# Patient Record
Sex: Female | Born: 1975
Health system: Southern US, Community
[De-identification: ages and names within clinical notes are randomized; demographics above are authoritative.]

## PROBLEM LIST (undated history)

## (undated) DIAGNOSIS — E119 Type 2 diabetes mellitus without complications: Secondary | ICD-10-CM

## (undated) DIAGNOSIS — Z91013 Allergy to seafood: Secondary | ICD-10-CM

## (undated) DIAGNOSIS — J302 Other seasonal allergic rhinitis: Secondary | ICD-10-CM

## (undated) DIAGNOSIS — I10 Essential (primary) hypertension: Secondary | ICD-10-CM

## (undated) DIAGNOSIS — T7840XA Allergy, unspecified, initial encounter: Secondary | ICD-10-CM

## (undated) DIAGNOSIS — F419 Anxiety disorder, unspecified: Secondary | ICD-10-CM

## (undated) DIAGNOSIS — J45909 Unspecified asthma, uncomplicated: Secondary | ICD-10-CM

## (undated) DIAGNOSIS — E785 Hyperlipidemia, unspecified: Secondary | ICD-10-CM

## (undated) HISTORY — DX: Hyperlipidemia, unspecified: E78.5

## (undated) HISTORY — DX: Allergy, unspecified, initial encounter: T78.40XA

---

## 1998-12-10 ENCOUNTER — Encounter: Payer: Self-pay | Admitting: Emergency Medicine

## 1998-12-10 ENCOUNTER — Emergency Department (HOSPITAL_COMMUNITY): Admission: EM | Admit: 1998-12-10 | Discharge: 1998-12-10 | Payer: Self-pay | Admitting: Emergency Medicine

## 1999-06-16 ENCOUNTER — Emergency Department (HOSPITAL_COMMUNITY): Admission: EM | Admit: 1999-06-16 | Discharge: 1999-06-16 | Payer: Self-pay | Admitting: Emergency Medicine

## 1999-07-05 ENCOUNTER — Ambulatory Visit (HOSPITAL_COMMUNITY): Admission: RE | Admit: 1999-07-05 | Discharge: 1999-07-05 | Payer: Self-pay | Admitting: *Deleted

## 1999-08-09 ENCOUNTER — Emergency Department (HOSPITAL_COMMUNITY): Admission: EM | Admit: 1999-08-09 | Discharge: 1999-08-09 | Payer: Self-pay | Admitting: Emergency Medicine

## 1999-09-11 ENCOUNTER — Inpatient Hospital Stay (HOSPITAL_COMMUNITY): Admission: AD | Admit: 1999-09-11 | Discharge: 1999-09-11 | Payer: Self-pay | Admitting: *Deleted

## 1999-09-13 ENCOUNTER — Ambulatory Visit (HOSPITAL_COMMUNITY): Admission: RE | Admit: 1999-09-13 | Discharge: 1999-09-13 | Payer: Self-pay | Admitting: *Deleted

## 1999-10-08 ENCOUNTER — Inpatient Hospital Stay (HOSPITAL_COMMUNITY): Admission: AD | Admit: 1999-10-08 | Discharge: 1999-10-08 | Payer: Self-pay | Admitting: Obstetrics

## 1999-10-14 ENCOUNTER — Inpatient Hospital Stay (HOSPITAL_COMMUNITY): Admission: AD | Admit: 1999-10-14 | Discharge: 1999-10-14 | Payer: Self-pay | Admitting: Obstetrics & Gynecology

## 1999-11-13 ENCOUNTER — Inpatient Hospital Stay (HOSPITAL_COMMUNITY): Admission: AD | Admit: 1999-11-13 | Discharge: 1999-11-13 | Payer: Self-pay | Admitting: *Deleted

## 1999-11-20 ENCOUNTER — Inpatient Hospital Stay (HOSPITAL_COMMUNITY): Admission: AD | Admit: 1999-11-20 | Discharge: 1999-11-20 | Payer: Self-pay | Admitting: *Deleted

## 1999-11-26 ENCOUNTER — Inpatient Hospital Stay (HOSPITAL_COMMUNITY): Admission: AD | Admit: 1999-11-26 | Discharge: 1999-11-26 | Payer: Self-pay | Admitting: *Deleted

## 1999-11-27 ENCOUNTER — Inpatient Hospital Stay (HOSPITAL_COMMUNITY): Admission: AD | Admit: 1999-11-27 | Discharge: 1999-11-28 | Payer: Self-pay | Admitting: Obstetrics & Gynecology

## 1999-12-04 ENCOUNTER — Inpatient Hospital Stay (HOSPITAL_COMMUNITY): Admission: AD | Admit: 1999-12-04 | Discharge: 1999-12-04 | Payer: Self-pay | Admitting: Obstetrics

## 2000-12-05 ENCOUNTER — Emergency Department (HOSPITAL_COMMUNITY): Admission: EM | Admit: 2000-12-05 | Discharge: 2000-12-05 | Payer: Self-pay | Admitting: Emergency Medicine

## 2000-12-05 ENCOUNTER — Encounter: Payer: Self-pay | Admitting: Emergency Medicine

## 2001-12-28 ENCOUNTER — Encounter: Payer: Self-pay | Admitting: *Deleted

## 2001-12-28 ENCOUNTER — Emergency Department (HOSPITAL_COMMUNITY): Admission: EM | Admit: 2001-12-28 | Discharge: 2001-12-28 | Payer: Self-pay | Admitting: *Deleted

## 2002-03-03 ENCOUNTER — Emergency Department (HOSPITAL_COMMUNITY): Admission: EM | Admit: 2002-03-03 | Discharge: 2002-03-03 | Payer: Self-pay | Admitting: Emergency Medicine

## 2003-05-08 ENCOUNTER — Emergency Department (HOSPITAL_COMMUNITY): Admission: EM | Admit: 2003-05-08 | Discharge: 2003-05-09 | Payer: Self-pay | Admitting: Emergency Medicine

## 2003-05-08 ENCOUNTER — Encounter: Payer: Self-pay | Admitting: Emergency Medicine

## 2003-11-09 ENCOUNTER — Emergency Department (HOSPITAL_COMMUNITY): Admission: AD | Admit: 2003-11-09 | Discharge: 2003-11-09 | Payer: Self-pay | Admitting: Family Medicine

## 2003-12-24 ENCOUNTER — Emergency Department (HOSPITAL_COMMUNITY): Admission: EM | Admit: 2003-12-24 | Discharge: 2003-12-24 | Payer: Self-pay | Admitting: Family Medicine

## 2004-07-04 ENCOUNTER — Emergency Department (HOSPITAL_COMMUNITY): Admission: EM | Admit: 2004-07-04 | Discharge: 2004-07-04 | Payer: Self-pay | Admitting: Emergency Medicine

## 2004-07-20 ENCOUNTER — Emergency Department (HOSPITAL_COMMUNITY): Admission: EM | Admit: 2004-07-20 | Discharge: 2004-07-20 | Payer: Self-pay | Admitting: Emergency Medicine

## 2005-02-14 ENCOUNTER — Emergency Department (HOSPITAL_COMMUNITY): Admission: EM | Admit: 2005-02-14 | Discharge: 2005-02-14 | Payer: Self-pay | Admitting: Emergency Medicine

## 2005-02-20 ENCOUNTER — Emergency Department (HOSPITAL_COMMUNITY): Admission: EM | Admit: 2005-02-20 | Discharge: 2005-02-20 | Payer: Self-pay | Admitting: Family Medicine

## 2005-05-31 ENCOUNTER — Emergency Department (HOSPITAL_COMMUNITY): Admission: EM | Admit: 2005-05-31 | Discharge: 2005-05-31 | Payer: Self-pay | Admitting: Emergency Medicine

## 2005-08-29 ENCOUNTER — Emergency Department (HOSPITAL_COMMUNITY): Admission: EM | Admit: 2005-08-29 | Discharge: 2005-08-30 | Payer: Self-pay | Admitting: Emergency Medicine

## 2005-09-17 ENCOUNTER — Emergency Department (HOSPITAL_COMMUNITY): Admission: EM | Admit: 2005-09-17 | Discharge: 2005-09-17 | Payer: Self-pay | Admitting: Emergency Medicine

## 2005-11-22 ENCOUNTER — Emergency Department (HOSPITAL_COMMUNITY): Admission: EM | Admit: 2005-11-22 | Discharge: 2005-11-22 | Payer: Self-pay | Admitting: Emergency Medicine

## 2006-11-17 ENCOUNTER — Emergency Department (HOSPITAL_COMMUNITY): Admission: EM | Admit: 2006-11-17 | Discharge: 2006-11-17 | Payer: Self-pay | Admitting: Emergency Medicine

## 2007-02-22 ENCOUNTER — Emergency Department (HOSPITAL_COMMUNITY): Admission: EM | Admit: 2007-02-22 | Discharge: 2007-02-22 | Payer: Self-pay | Admitting: *Deleted

## 2007-05-06 ENCOUNTER — Emergency Department (HOSPITAL_COMMUNITY): Admission: EM | Admit: 2007-05-06 | Discharge: 2007-05-07 | Payer: Self-pay | Admitting: Emergency Medicine

## 2007-06-13 ENCOUNTER — Ambulatory Visit (HOSPITAL_COMMUNITY): Admission: RE | Admit: 2007-06-13 | Discharge: 2007-06-13 | Payer: Self-pay | Admitting: Obstetrics & Gynecology

## 2007-07-08 ENCOUNTER — Ambulatory Visit (HOSPITAL_COMMUNITY): Admission: RE | Admit: 2007-07-08 | Discharge: 2007-07-08 | Payer: Self-pay | Admitting: Obstetrics & Gynecology

## 2007-07-16 ENCOUNTER — Ambulatory Visit (HOSPITAL_COMMUNITY): Admission: RE | Admit: 2007-07-16 | Discharge: 2007-07-16 | Payer: Self-pay | Admitting: Obstetrics & Gynecology

## 2007-11-22 ENCOUNTER — Inpatient Hospital Stay (HOSPITAL_COMMUNITY): Admission: AD | Admit: 2007-11-22 | Discharge: 2007-11-23 | Payer: Self-pay | Admitting: Obstetrics & Gynecology

## 2007-11-27 HISTORY — PX: TUBAL LIGATION: SHX77

## 2007-12-04 ENCOUNTER — Inpatient Hospital Stay (HOSPITAL_COMMUNITY): Admission: AD | Admit: 2007-12-04 | Discharge: 2007-12-07 | Payer: Self-pay | Admitting: Family Medicine

## 2007-12-04 ENCOUNTER — Ambulatory Visit: Payer: Self-pay | Admitting: Family Medicine

## 2007-12-05 ENCOUNTER — Encounter (INDEPENDENT_AMBULATORY_CARE_PROVIDER_SITE_OTHER): Payer: Self-pay | Admitting: Gynecology

## 2007-12-13 ENCOUNTER — Ambulatory Visit: Payer: Self-pay | Admitting: Physician Assistant

## 2007-12-13 ENCOUNTER — Inpatient Hospital Stay (HOSPITAL_COMMUNITY): Admission: AD | Admit: 2007-12-13 | Discharge: 2007-12-13 | Payer: Self-pay | Admitting: Obstetrics & Gynecology

## 2007-12-19 ENCOUNTER — Inpatient Hospital Stay (HOSPITAL_COMMUNITY): Admission: AD | Admit: 2007-12-19 | Discharge: 2007-12-19 | Payer: Self-pay | Admitting: Obstetrics & Gynecology

## 2007-12-23 ENCOUNTER — Inpatient Hospital Stay (HOSPITAL_COMMUNITY): Admission: AD | Admit: 2007-12-23 | Discharge: 2007-12-23 | Payer: Self-pay | Admitting: Obstetrics & Gynecology

## 2009-04-25 ENCOUNTER — Emergency Department (HOSPITAL_COMMUNITY): Admission: EM | Admit: 2009-04-25 | Discharge: 2009-04-25 | Payer: Self-pay | Admitting: Emergency Medicine

## 2009-09-01 ENCOUNTER — Emergency Department (HOSPITAL_COMMUNITY): Admission: EM | Admit: 2009-09-01 | Discharge: 2009-09-01 | Payer: Self-pay | Admitting: Emergency Medicine

## 2009-11-23 ENCOUNTER — Emergency Department (HOSPITAL_COMMUNITY): Admission: EM | Admit: 2009-11-23 | Discharge: 2009-11-23 | Payer: Self-pay | Admitting: Emergency Medicine

## 2011-02-02 ENCOUNTER — Ambulatory Visit (INDEPENDENT_AMBULATORY_CARE_PROVIDER_SITE_OTHER): Payer: Self-pay

## 2011-02-02 ENCOUNTER — Inpatient Hospital Stay (INDEPENDENT_AMBULATORY_CARE_PROVIDER_SITE_OTHER)
Admission: RE | Admit: 2011-02-02 | Discharge: 2011-02-02 | Disposition: A | Discharge status: Home / Self Care | Payer: Self-pay | Source: Ambulatory Visit | Attending: Emergency Medicine | Admitting: Emergency Medicine

## 2011-02-02 DIAGNOSIS — S63509A Unspecified sprain of unspecified wrist, initial encounter: Secondary | ICD-10-CM

## 2011-02-13 ENCOUNTER — Inpatient Hospital Stay (INDEPENDENT_AMBULATORY_CARE_PROVIDER_SITE_OTHER)
Admission: RE | Admit: 2011-02-13 | Discharge: 2011-02-13 | Disposition: A | Discharge status: Home / Self Care | Payer: Self-pay | Source: Ambulatory Visit | Attending: Family Medicine | Admitting: Family Medicine

## 2011-02-13 DIAGNOSIS — G609 Hereditary and idiopathic neuropathy, unspecified: Secondary | ICD-10-CM

## 2011-04-10 NOTE — Op Note (Signed)
Wendy Olson, Wendy Olson              ACCOUNT NO.:  [id number]   MEDICAL RECORD NO.:  [id number]          PATIENT TYPE:  INP   LOCATION:  9147                          FACILITY:  [facility]   PHYSICIAN:  Ginger Carne, MD  DATE OF BIRTH:  1976-07-03   DATE OF PROCEDURE:  DATE OF DISCHARGE:                               OPERATIVE REPORT   PREOPERATIVE DIAGNOSIS:  Request for sterilization.   POSTOPERATIVE DIAGNOSIS:  Request for sterilization.   PROCEDURE:  [place] postpartum bilateral tubal ligation.   SURGEON:  Ginger Carne, MD.   ASSISTANT:  None.   COMPLICATIONS:  None immediate.   ESTIMATED BLOOD LOSS:  Minimal.   SPECIMEN:  Portions of right and left tube to pathology.   PATHOLOGY:  Upon opening the abdomen, both tubes were identified from  their isthmus to fimbriated ends separate and apart from their  respective round ligaments. The ovaries appeared normal.  The uterus,  tubes and ovaries showed normal decidual changes of pregnancy.   OPERATIVE PROCEDURE:  The patient prepped and draped in the usual  fashion and placed in the left lateral supine position.  Betadine  solution used for antiseptic and the patient was catheterized prior to  the procedure.  After adequate general anesthesia, a vertical  infraumbilical incision was made and the abdomen opened.  Both tubes  were grasped at their isthmus ampullary junction incorporating about 3  cm of tube. The tubes were tied at the base with 2-0 plain catgut suture  twice affixed to the proximal portions of the tube. The tubes caught the  above said knot, cauterized at the tips, no active bleeding noted.  Closure of the fascia in one layer of 2-0 Vicryl running interlocking  suture, 2-0 plain catgut for the subcutaneous layer and skin staples for  the skin.  Instrument and sponge count were correct.  The patient  tolerated the procedure well and returned to the post anesthesia  recovery room in excellent  condition.      Ginger Carne, MD  Electronically Signed     SHB/MEDQ  D:  12/05/2007  T:  12/05/2007  Job:  [id number]

## 2011-08-16 LAB — COMPREHENSIVE METABOLIC PANEL
ALT: <8
AST: 11
Albumin: 2.5 — ABNORMAL LOW
Alkaline Phosphatase: 84
Calcium: 8.6
Glucose, Bld: 89
Potassium: 3.5
Sodium: 135
Total Protein: 5.9 — ABNORMAL LOW

## 2011-08-16 LAB — URINALYSIS, ROUTINE W REFLEX MICROSCOPIC
Bilirubin Urine: NEGATIVE
Nitrite: NEGATIVE
Specific Gravity, Urine: >1.03 — ABNORMAL HIGH
pH: 6

## 2011-08-16 LAB — URINE MICROSCOPIC-ADD ON

## 2011-08-16 LAB — RPR: RPR Ser Ql: NONREACTIVE

## 2011-08-16 LAB — CBC
MCHC: 35.4
RDW: 13.3

## 2011-08-17 LAB — WOUND CULTURE

## 2011-09-13 LAB — URINALYSIS, ROUTINE W REFLEX MICROSCOPIC
Bilirubin Urine: NEGATIVE
Glucose, UA: NEGATIVE
Hgb urine dipstick: NEGATIVE
Ketones, ur: NEGATIVE
Specific Gravity, Urine: 1.033 — ABNORMAL HIGH
pH: 6

## 2011-09-13 LAB — CBC
Hemoglobin: 12.4
MCHC: 34.2
RBC: 4.01
WBC: 10

## 2011-09-13 LAB — ABO/RH: ABO/RH(D): O POS

## 2011-09-13 LAB — WET PREP, GENITAL: Yeast Wet Prep HPF POC: NONE SEEN

## 2011-09-13 LAB — GC/CHLAMYDIA PROBE AMP, GENITAL: GC Probe Amp, Genital: NEGATIVE

## 2011-09-13 LAB — DIFFERENTIAL
Basophils Relative: 0
Eosinophils Relative: 1
Monocytes Absolute: 0.6
Monocytes Relative: 6

## 2011-09-13 LAB — RPR: RPR Ser Ql: NONREACTIVE

## 2011-09-13 LAB — POCT PREGNANCY, URINE: Operator id: 277751

## 2012-05-08 ENCOUNTER — Emergency Department (HOSPITAL_COMMUNITY)
Admission: EM | Admit: 2012-05-08 | Discharge: 2012-05-08 | Disposition: A | Discharge status: Home / Self Care | Payer: Self-pay | Attending: Emergency Medicine | Admitting: Emergency Medicine

## 2012-05-08 ENCOUNTER — Encounter (HOSPITAL_COMMUNITY): Payer: Self-pay | Admitting: *Deleted

## 2012-05-08 DIAGNOSIS — Z87891 Personal history of nicotine dependence: Secondary | ICD-10-CM | POA: Insufficient documentation

## 2012-05-08 DIAGNOSIS — N390 Urinary tract infection, site not specified: Secondary | ICD-10-CM | POA: Insufficient documentation

## 2012-05-08 DIAGNOSIS — I10 Essential (primary) hypertension: Secondary | ICD-10-CM | POA: Insufficient documentation

## 2012-05-08 HISTORY — DX: Essential (primary) hypertension: I10

## 2012-05-08 LAB — URINE MICROSCOPIC-ADD ON

## 2012-05-08 LAB — URINALYSIS, ROUTINE W REFLEX MICROSCOPIC
Bilirubin Urine: NEGATIVE
Glucose, UA: >1000 mg/dL — AB
Hgb urine dipstick: NEGATIVE
Ketones, ur: NEGATIVE mg/dL
Nitrite: NEGATIVE
Specific Gravity, Urine: 1.035 — ABNORMAL HIGH (ref 1.005–1.030)
pH: 5 (ref 5.0–8.0)

## 2012-05-08 MED ORDER — CEPHALEXIN 500 MG PO CAPS: 500.0000 mg | ORAL_CAPSULE | Freq: Four times a day (QID) | ORAL | Status: AC

## 2012-05-08 NOTE — ED Provider Notes (Signed)
History     CSN: [id number]  Arrival date & time 05/08/12  0741   First MD Initiated Contact with Patient 05/08/12 0759      No chief complaint on file.   Patient is a 36 y.o. female presenting with dysuria. The history is provided by the patient.  Dysuria  This is a new problem. The current episode started more than 1 week ago. The problem occurs every urination. The problem has not changed since onset.The quality of the pain is described as burning and aching. The pain is moderate. There has been no fever. She is sexually active. There is no history of pyelonephritis. Associated symptoms include frequency, hesitancy and urgency. Pertinent negatives include no chills, no sweats, no nausea, no vomiting, no discharge, no hematuria and no flank pain. She has tried nothing for the symptoms. Her past medical history is significant for recurrent UTIs. Her past medical history does not include kidney stones, single kidney, urological procedure, urinary stasis or catheterization.    Past Medical History  Diagnosis Date  . Hypertension     Past Surgical History  Procedure Date  . Tubal ligation 2009    History reviewed. No pertinent family history.  History  Substance Use Topics  . Smoking status: Former Games developer  . Smokeless tobacco: Not on file  . Alcohol Use: No    OB History    Grav Para Term Preterm Abortions TAB SAB Ect Mult Living                  Review of Systems  Constitutional: Negative for fever, chills, activity change and appetite change.  HENT: Negative for neck pain and neck stiffness.   Respiratory: Negative for cough and wheezing.   Cardiovascular: Negative for chest pain and palpitations.  Gastrointestinal: Negative for nausea, vomiting, abdominal pain, diarrhea and constipation.  Genitourinary: Positive for dysuria, hesitancy, urgency and frequency. Negative for hematuria, flank pain, decreased urine volume and difficulty urinating.  Psychiatric/Behavioral:  Negative for confusion and agitation.  All other systems reviewed and are negative.    Allergies  Review of patient's allergies indicates no known allergies.  Home Medications  No current outpatient prescriptions on file.  BP 162/100  Pulse 110  Temp 98.2 F (36.8 C) (Oral)  Resp 20  SpO2 97%  LMP 04/08/2012  Physical Exam  Nursing note and vitals reviewed. Constitutional: She is oriented to person, place, and time. She appears well-developed and well-nourished.  HENT:  Head: Normocephalic and atraumatic.  Right Ear: External ear normal.  Left Ear: External ear normal.  Nose: Nose normal.  Mouth/Throat: Oropharynx is clear and moist. No oropharyngeal exudate.  Eyes: Conjunctivae are normal. Pupils are equal, round, and reactive to light.  Neck: Normal range of motion.  Cardiovascular: Normal rate, regular rhythm, normal heart sounds and intact distal pulses.  Exam reveals no gallop and no friction rub.   No murmur heard. Pulmonary/Chest: Effort normal and breath sounds normal. No respiratory distress.  Abdominal: Soft. Bowel sounds are normal. She exhibits no distension and no mass. There is no tenderness. There is no rebound and no guarding.       No CVA TTP   Neurological: She is alert and oriented to person, place, and time.  Skin: Skin is warm and dry.  Psychiatric: She has a normal mood and affect. Her behavior is normal. Judgment and thought content normal.    ED Course  Procedures (including critical care time)  Labs Reviewed  URINALYSIS, ROUTINE W REFLEX  MICROSCOPIC - Abnormal; Notable for the following:    APPearance CLOUDY (*)     Specific Gravity, Urine 1.035 (*)     Glucose, UA >1000 (*)     Leukocytes, UA SMALL (*)     All other components within normal limits  URINE MICROSCOPIC-ADD ON - Abnormal; Notable for the following:    Squamous Epithelial / LPF FEW (*)     All other components within normal limits  POCT PREGNANCY, URINE   No results  found.   1. Urinary tract infection      MDM  F w/hx of recurrent UTI's presents for 2 weeks of dysuria, hesitancy, and frequency. No back pain, fever, chills, or nausea/vomiting. Afebrile. Slight tachycardia and moderate elevation in blood pressure on arrival; however, tachycardia resolved (92 bpm) without treatment. No TTP overlying abdomen. Clinical picture not concerning for ovarian torsion or appendicitis. Urinalysis not c/w UTI; however, with her history and symptomatology, will empirically treat for simple cystitis. Urine hCG negative. Patient given return precautions, including worsening of signs or symptoms. Patient instructed to follow-up with primary care physician.          Clemetine Marker, MD 05/08/12 [phone]

## 2012-05-08 NOTE — ED Notes (Addendum)
Patient states she is having burning and pain and pressure with urination x 3 weeks. Patient states she has been drinking plenty of water and cranberry juice. Patient denies discharge or other pain and denies N/V/F/D.

## 2012-05-08 NOTE — ED Provider Notes (Signed)
I have personally seen and examined the patient.  I have discussed the plan of care with the resident.  I have reviewed the documentation on PMH/FH/Soc. History.  I have reviewed the documentation of the resident and agree.  Pt well appearing and nontoxic in appearance Stable for d/c  Joya Gaskins, MD 05/08/12 2024

## 2012-05-08 NOTE — Discharge Instructions (Signed)

## 2013-09-20 ENCOUNTER — Emergency Department (HOSPITAL_COMMUNITY)
Admission: EM | Admit: 2013-09-20 | Discharge: 2013-09-20 | Disposition: A | Discharge status: Home / Self Care | Payer: Self-pay | Attending: Emergency Medicine | Admitting: Emergency Medicine

## 2013-09-20 ENCOUNTER — Encounter (HOSPITAL_COMMUNITY): Payer: Self-pay | Admitting: Emergency Medicine

## 2013-09-20 DIAGNOSIS — L0291 Cutaneous abscess, unspecified: Secondary | ICD-10-CM

## 2013-09-20 DIAGNOSIS — F411 Generalized anxiety disorder: Secondary | ICD-10-CM | POA: Insufficient documentation

## 2013-09-20 DIAGNOSIS — J45909 Unspecified asthma, uncomplicated: Secondary | ICD-10-CM | POA: Insufficient documentation

## 2013-09-20 DIAGNOSIS — Z87891 Personal history of nicotine dependence: Secondary | ICD-10-CM | POA: Insufficient documentation

## 2013-09-20 DIAGNOSIS — I10 Essential (primary) hypertension: Secondary | ICD-10-CM | POA: Insufficient documentation

## 2013-09-20 DIAGNOSIS — L02818 Cutaneous abscess of other sites: Secondary | ICD-10-CM | POA: Insufficient documentation

## 2013-09-20 DIAGNOSIS — Z79899 Other long term (current) drug therapy: Secondary | ICD-10-CM | POA: Insufficient documentation

## 2013-09-20 HISTORY — DX: Anxiety disorder, unspecified: F41.9

## 2013-09-20 HISTORY — DX: Unspecified asthma, uncomplicated: J45.909

## 2013-09-20 MED ORDER — SULFAMETHOXAZOLE-TMP DS 800-160 MG PO TABS: 1.0000 | ORAL_TABLET | Freq: Two times a day (BID) | ORAL | Status: DC

## 2013-09-20 MED ORDER — TRAMADOL HCL 50 MG PO TABS: 50.0000 mg | ORAL_TABLET | Freq: Four times a day (QID) | ORAL | Status: DC | PRN

## 2013-09-20 NOTE — ED Provider Notes (Signed)
CSN: [id number]     Arrival date & time 09/20/13  0808 History  This chart was scribed for non-physician practitioner, Wynetta Emery, PA-C,working with Doug Sou, MD, by Karle Plumber, ED Scribe.  This patient was seen in room TR06C/TR06C and the patient's care was started at 9:23 AM.  Chief Complaint  Patient presents with  . Facial Swelling   HPI HPI Comments:  Wendy Olson is a 37 y.o. female who presents to the Emergency Department complaining of constant moderate soreness from a left-sided scalp swelling onset 12 hours out. She denies having DM or being allergic to any medications, denies fever, nausea vomiting, otalgia. She states she has a family h/o DM. She states she does not have a PCP.   Past Medical History  Diagnosis Date  . Hypertension   . Anxiety   . Asthma    Past Surgical History  Procedure Laterality Date  . Tubal ligation  2009   History reviewed. No pertinent family history. History  Substance Use Topics  . Smoking status: Former Games developer  . Smokeless tobacco: Never Used  . Alcohol Use: No   OB History   Grav Para Term Preterm Abortions TAB SAB Ect Mult Living                 Review of Systems A complete 10 system review of systems was obtained and all systems are negative except as noted in the HPI and PMH.   Allergies  Review of patient's allergies indicates no known allergies.  Home Medications   Current Outpatient Rx  Name  Route  Sig  Dispense  Refill  . sulfamethoxazole-trimethoprim (BACTRIM DS) 800-160 MG per tablet   Oral   Take 1 tablet by mouth 2 (two) times daily.   14 tablet   0   . traMADol (ULTRAM) 50 MG tablet   Oral   Take 1 tablet (50 mg total) by mouth every 6 (six) hours as needed for pain.   15 tablet   0    Triage Vitals: BP 147/99  Pulse 89  Temp(Src) 98.2 F (36.8 C) (Oral)  Resp 18  Ht 5\' 7"  (1.702 m)  Wt 225 lb 3.2 oz (102.15 kg)  BMI 35.26 kg/m2  SpO2 96%  LMP 09/09/2013 Physical Exam   Nursing note and vitals reviewed. Constitutional: She is oriented to person, place, and time. She appears well-developed and well-nourished. No distress.  HENT:  Head: Normocephalic and atraumatic.    Mouth/Throat: Oropharynx is clear and moist.  Tympanic membranes have normal architecture and good light reflex bilaterally, no mastoid tenderness to palpation  Eyes: Conjunctivae and EOM are normal. Pupils are equal, round, and reactive to light.  Neck:  Shotty anterior cervical and posterior auricular lymphadenopathy on the left side, nontender to palpation, mobile  Cardiovascular: Normal rate.   Pulmonary/Chest: Effort normal. No stridor.  Abdominal: Soft.  Musculoskeletal: Normal range of motion.  Lymphadenopathy:    She has cervical adenopathy.  Neurological: She is alert and oriented to person, place, and time.  Psychiatric: She has a normal mood and affect.    ED Course  Procedures (including critical care time) DIAGNOSTIC STUDIES: Oxygen Saturation is 96% on RA, adequate by my interpretation.   COORDINATION OF CARE: 9:24 AM- Will prescribe oral antibiotics for infection. Pt verbalizes understanding and agrees to plan.  Medications - No data to display  Labs Review Labs Reviewed - No data to display Imaging Review No results found.  EKG Interpretation   None  MDM   1. Abscess     Filed Vitals:   09/20/13 0816 09/20/13 0939  BP: 147/99 130/90  Pulse: 89 84  Temp: 98.2 F (36.8 C)   TempSrc: Oral   Resp: 18 16  Height: 5\' 7"  (1.702 m)   Weight: 225 lb 3.2 oz (102.15 kg)   SpO2: 96% 97%     COURNEY Olson is a 37 y.o. female with small, early abscess to left scalp. Associated mild lymphadenopathy on the left side. No signs of systemic infection. Too small to I&D.  Pt is hemodynamically stable, appropriate for, and amenable to discharge at this time. Pt verbalized understanding and agrees with care plan. All questions answered. Outpatient  follow-up and specific return precautions discussed.    Discharge Medication List as of 09/20/2013  9:25 AM    START taking these medications   Details  sulfamethoxazole-trimethoprim (BACTRIM DS) 800-160 MG per tablet Take 1 tablet by mouth 2 (two) times daily., Starting 09/20/2013, Until Discontinued, Print    traMADol (ULTRAM) 50 MG tablet Take 1 tablet (50 mg total) by mouth every 6 (six) hours as needed for pain., Starting 09/20/2013, Until Discontinued, Print        I personally performed the services described in this documentation, which was scribed in my presence. The recorded information has been reviewed and is accurate.  Note: Portions of this report may have been transcribed using voice recognition software. Every effort was made to ensure accuracy; however, inadvertent computerized transcription errors may be present    Wynetta Emery, PA-C 09/20/13 1044

## 2013-09-20 NOTE — ED Provider Notes (Signed)
Medical screening examination/treatment/procedure(s) were performed by non-physician practitioner and as supervising physician I was immediately available for consultation/collaboration.  EKG Interpretation   None        Doug Sou, MD 09/20/13 1654

## 2013-09-20 NOTE — ED Notes (Signed)
C/o of redness and swelling on the left side of the head starting yesterday.  Painful to touch and swelling has increased since onset to behind the ear and left temporal lobe.  NAD, A&O.

## 2014-03-11 ENCOUNTER — Encounter (HOSPITAL_COMMUNITY): Payer: Self-pay | Admitting: Emergency Medicine

## 2014-03-11 ENCOUNTER — Emergency Department (HOSPITAL_COMMUNITY)
Admission: EM | Admit: 2014-03-11 | Discharge: 2014-03-11 | Disposition: A | Discharge status: Home / Self Care | Payer: No Typology Code available for payment source | Attending: Emergency Medicine | Admitting: Emergency Medicine

## 2014-03-11 DIAGNOSIS — Y9389 Activity, other specified: Secondary | ICD-10-CM | POA: Insufficient documentation

## 2014-03-11 DIAGNOSIS — Y92410 Unspecified street and highway as the place of occurrence of the external cause: Secondary | ICD-10-CM | POA: Insufficient documentation

## 2014-03-11 DIAGNOSIS — F411 Generalized anxiety disorder: Secondary | ICD-10-CM | POA: Insufficient documentation

## 2014-03-11 DIAGNOSIS — M549 Dorsalgia, unspecified: Secondary | ICD-10-CM

## 2014-03-11 DIAGNOSIS — IMO0002 Reserved for concepts with insufficient information to code with codable children: Secondary | ICD-10-CM | POA: Insufficient documentation

## 2014-03-11 DIAGNOSIS — Z87891 Personal history of nicotine dependence: Secondary | ICD-10-CM | POA: Insufficient documentation

## 2014-03-11 DIAGNOSIS — J45909 Unspecified asthma, uncomplicated: Secondary | ICD-10-CM | POA: Insufficient documentation

## 2014-03-11 DIAGNOSIS — Z79899 Other long term (current) drug therapy: Secondary | ICD-10-CM | POA: Insufficient documentation

## 2014-03-11 DIAGNOSIS — I10 Essential (primary) hypertension: Secondary | ICD-10-CM | POA: Insufficient documentation

## 2014-03-11 MED ORDER — IBUPROFEN 800 MG PO TABS: 800.0000 mg | ORAL_TABLET | Freq: Three times a day (TID) | ORAL | Status: DC

## 2014-03-11 MED ORDER — IBUPROFEN 400 MG PO TABS
800.0000 mg | ORAL_TABLET | Freq: Once | ORAL | Status: AC
  Administered 2014-03-11: 800 mg via ORAL
  Filled 2014-03-11: qty 2

## 2014-03-11 MED ORDER — CYCLOBENZAPRINE HCL 10 MG PO TABS: 10.0000 mg | ORAL_TABLET | Freq: Two times a day (BID) | ORAL | Status: DC | PRN

## 2014-03-11 NOTE — Discharge Instructions (Signed)
Take ibuprofen as needed for pain. Take Flexeril as needed for muscle spasm. Refer to attached documents for more information. Return to the ED with worsening or concerning symptoms.
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
## 2014-03-12 NOTE — ED Provider Notes (Signed)
Medical screening examination/treatment/procedure(s) were performed by non-physician practitioner and as supervising physician I was immediately available for consultation/collaboration.   EKG Interpretation None        Jaree Trinka M Larence Thone, DO 03/12/14 1555 

## 2014-06-13 ENCOUNTER — Encounter (HOSPITAL_COMMUNITY): Payer: Self-pay | Admitting: Emergency Medicine

## 2014-06-13 ENCOUNTER — Emergency Department (INDEPENDENT_AMBULATORY_CARE_PROVIDER_SITE_OTHER)
Admission: EM | Admit: 2014-06-13 | Discharge: 2014-06-13 | Disposition: A | Discharge status: Home / Self Care | Payer: Self-pay | Source: Home / Self Care | Attending: Emergency Medicine | Admitting: Emergency Medicine

## 2014-06-13 DIAGNOSIS — H60399 Other infective otitis externa, unspecified ear: Secondary | ICD-10-CM

## 2014-06-13 DIAGNOSIS — H6092 Unspecified otitis externa, left ear: Secondary | ICD-10-CM

## 2014-06-13 HISTORY — DX: Allergy to seafood: Z91.013

## 2014-06-13 HISTORY — DX: Other seasonal allergic rhinitis: J30.2

## 2014-06-13 MED ORDER — NEOMYCIN-POLYMYXIN-HC 3.5-10000-1 OT SUSP: 4.0000 [drp] | Freq: Three times a day (TID) | OTIC | Status: DC

## 2014-06-13 NOTE — Discharge Instructions (Signed)
Otitis Externa Otitis externa is a bacterial or fungal infection of the outer ear canal. This is the area from the eardrum to the outside of the ear. Otitis externa is sometimes called "swimmer's ear." CAUSES  Possible causes of infection include:  Swimming in dirty water.  Moisture remaining in the ear after swimming or bathing.  Mild injury (trauma) to the ear.  Objects stuck in the ear (foreign body).  Cuts or scrapes (abrasions) on the outside of the ear. SYMPTOMS  The first symptom of infection is often itching in the ear canal. Later signs and symptoms may include swelling and redness of the ear canal, ear pain, and yellowish-white fluid (pus) coming from the ear. The ear pain may be worse when pulling on the earlobe. DIAGNOSIS  Your caregiver will perform a physical exam. A sample of fluid may be taken from the ear and examined for bacteria or fungi. TREATMENT  Antibiotic ear drops are often given for 10 to 14 days. Treatment may also include pain medicine or corticosteroids to reduce itching and swelling. PREVENTION   Keep your ear dry. Use the corner of a towel to absorb water out of the ear canal after swimming or bathing.  Avoid scratching or putting objects inside your ear. This can damage the ear canal or remove the protective wax that lines the canal. This makes it easier for bacteria and fungi to grow.  Avoid swimming in lakes, polluted water, or poorly chlorinated pools.  You may use ear drops made of rubbing alcohol and vinegar after swimming. Combine equal parts of white vinegar and alcohol in a bottle. Put 3 or 4 drops into each ear after swimming. HOME CARE INSTRUCTIONS   Apply antibiotic ear drops to the ear canal as prescribed by your caregiver.  Only take over-the-counter or prescription medicines for pain, discomfort, or fever as directed by your caregiver.  If you have diabetes, follow any additional treatment instructions from your caregiver.  Keep all  follow-up appointments as directed by your caregiver. SEEK MEDICAL CARE IF:   You have a fever.  Your ear is still red, swollen, painful, or draining pus after 3 days.  Your redness, swelling, or pain gets worse.  You have a severe headache.  You have redness, swelling, pain, or tenderness in the area behind your ear. MAKE SURE YOU:   Understand these instructions.  Will watch your condition.  Will get help right away if you are not doing well or get worse. Document Released: 11/12/2005 Document Revised: 02/04/2012 Document Reviewed: 11/29/2011 [facility] Patient Information 2015 [place], [place]. This information is not intended to replace advice given to you by your health care provider. Make sure you discuss any questions you have with your health care provider.

## 2014-06-13 NOTE — ED Notes (Signed)
C/o bump on L ear on the edge of the ear canal onset 3 days ago.  States painful if she turns her head to the L, touches it or open her mouth.

## 2014-06-13 NOTE — ED Notes (Signed)
Bed: UC05 Expected date:  Expected time:  Means of arrival:  Comments:

## 2014-06-13 NOTE — ED Provider Notes (Signed)
CSN: [id number]     Arrival date & time 06/13/14  1652 History   First MD Initiated Contact with Patient 06/13/14 1739     Chief Complaint  Patient presents with  . Mass   (Consider location/radiation/quality/duration/timing/severity/associated sxs/prior Treatment) HPI Comments: Reports 3 days of left ear pain. No changes in hearing of fever. States area is painful when she tries to chew or open her mouth wide. No drainage from ear canal. States she swims in pool daily.   The history is provided by the patient.    Past Medical History  Diagnosis Date  . Hypertension   . Anxiety   . Asthma   . Seasonal allergies   . 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 Tubal ligation  2009   Family History  Problem Relation Age of Onset  . Diabetes Mother   . Hypertension Mother   . Dementia Mother   . Diabetes Father   . Hypertension Father    History  Substance Use Topics  . Smoking status: Former Smoker    Quit date: 01/25/2007  . Smokeless tobacco: Never Used  . Alcohol Use: No   OB History   Grav Para Term Preterm Abortions TAB SAB Ect Mult Living                 Review of Systems  All other systems reviewed and are negative.   Allergies  Review of patient's allergies indicates no known allergies.  Home Medications   Prior to Admission medications   Medication Sig Start Date End Date Taking? Authorizing Provider  cyclobenzaprine (FLEXERIL) 10 MG tablet Take 1 tablet (10 mg total) by mouth 2 (two) times daily as needed for muscle spasms. 03/11/14   Kaitlyn Szekalski, PA-C  ibuprofen (ADVIL,MOTRIN) 800 MG tablet Take 1 tablet (800 mg total) by mouth 3 (three) times daily. 03/11/14   Kaitlyn Szekalski, PA-C  neomycin-polymyxin-hydrocortisone (CORTISPORIN) 3.5-10000-1 otic suspension Place 4 drops into the left ear 3 (three) times daily. X 7 days 06/13/14   Annett Gula Adoria Kawamoto, PA   BP 149/84  Pulse 89  Temp(Src) 98.6 F (37 C) (Oral)   Resp 16  SpO2 100%  LMP 05/15/2014 Physical Exam  Nursing note and vitals reviewed. Constitutional: She is oriented to person, place, and time. She appears well-developed and well-nourished. No distress.  HENT:  Head: Normocephalic and atraumatic.  Right Ear: Hearing, tympanic membrane, external ear and ear canal normal.  Left Ear: Hearing and tympanic membrane normal. There is tenderness. No mastoid tenderness. Tympanic membrane is not injected.  Erythematous left ear canal with white exudate on floor of canal. Tenderness during speculum exam and with movement of external ear.   Eyes: Conjunctivae are normal. No scleral icterus.  Neck: Normal range of motion. Neck supple.  Cardiovascular: Normal rate.   Pulmonary/Chest: Effort normal.  Musculoskeletal: Normal range of motion.  Lymphadenopathy:    She has no cervical adenopathy.  Neurological: She is alert and oriented to person, place, and time.  Skin: Skin is warm and dry. No rash noted. No erythema.  Psychiatric: She has a normal mood and affect. Her behavior is normal.    ED Course  Procedures (including critical care time) Labs Review Labs Reviewed - No data to display  Imaging Review No results found.   MDM   1. Otitis externa, left    Acute otitis externa. No swimming for one week. Cortisporin Otic as prescribed with follow up if  no improvement.    [place], [place] 06/13/14 Vernelle Emerald

## 2014-06-13 NOTE — ED Provider Notes (Signed)
Medical screening examination/treatment/procedure(s) were performed by non-physician practitioner and as supervising physician I was immediately available for consultation/collaboration.  Philipp Deputy, M.D.  Harden Mo, MD 06/13/14 [phone]

## 2014-07-19 ENCOUNTER — Encounter (HOSPITAL_COMMUNITY): Payer: Self-pay | Admitting: Emergency Medicine

## 2014-07-19 ENCOUNTER — Emergency Department (HOSPITAL_COMMUNITY)
Admission: EM | Admit: 2014-07-19 | Discharge: 2014-07-19 | Disposition: A | Discharge status: Home / Self Care | Payer: No Typology Code available for payment source | Attending: Emergency Medicine | Admitting: Emergency Medicine

## 2014-07-19 DIAGNOSIS — H6002 Abscess of left external ear: Secondary | ICD-10-CM

## 2014-07-19 DIAGNOSIS — H60399 Other infective otitis externa, unspecified ear: Secondary | ICD-10-CM | POA: Insufficient documentation

## 2014-07-19 DIAGNOSIS — H9209 Otalgia, unspecified ear: Secondary | ICD-10-CM | POA: Insufficient documentation

## 2014-07-19 MED ORDER — CEPHALEXIN 500 MG PO CAPS: 500.0000 mg | ORAL_CAPSULE | Freq: Three times a day (TID) | ORAL | Status: DC

## 2014-07-19 MED ORDER — HYDROCODONE-ACETAMINOPHEN 5-325 MG PO TABS: ORAL_TABLET | ORAL | Status: DC

## 2014-07-19 MED ORDER — SULFAMETHOXAZOLE-TMP DS 800-160 MG PO TABS: 1.0000 | ORAL_TABLET | Freq: Two times a day (BID) | ORAL | Status: DC

## 2014-07-19 NOTE — Discharge Instructions (Signed)

## 2014-07-19 NOTE — ED Notes (Signed)
Pt reports cyst and pain in left ear for over a month.

## 2014-07-19 NOTE — ED Provider Notes (Signed)
  Chief Complaint   Chief Complaint  Patient presents with  . Otalgia    History of Present Illness   Wendy Olson is a 38 year old female who is had one month history of recurring pain in the left ear. She was seen at urgent care a month ago and given drops, this helped for a while, but then the pain came back again. It is rated 8/10 in intensity. She's had no drainage from her ear. She denies any fever, chills, headache, nasal congestion, rhinorrhea, sore throat, adenopathy, or stiff neck.  Review of Systems   Other than as noted above, the patient denies any of the following symptoms: Systemic:  No fevers or chills. Eye:  No redness, pain, discharge, itching, blurred vision, or diplopia. ENT:  No headache, nasal congestion, sneezing, itching, epistaxis, ear pain, decreased hearing, ringing in ears, vertigo, or tinnitus.  No oral lesions, sore throat, or hoarseness. Neck:  No neck pain or adenopathy. Skin:  No rash or itching.  [place]   Past medical history, family history, social history, meds, and allergies were reviewed.   Physical Examination     Vital signs:  BP 152/79  Pulse 84  Temp(Src) 98.3 F (36.8 C) (Oral)  Resp 14  Wt 219 lb (99.338 kg)  SpO2 97%  LMP 07/19/2014 General:  Alert and oriented.  In no distress.  Skin warm and dry. Eye:  PERRL, full EOMs, lids and conjunctiva normal.   ENT: There is a small furuncle in the left external ear canal. The TM was not seen. The right ear canal and TM were normal. Nasal mucosa was not congested and without drainage.  Mucous membranes moist, no oral lesions, normal dentition, pharynx clear.  No cranial or facial pain to palplation. There is no pain or swelling over the mastoid. Neck:  Supple, full ROM.  No adenopathy, tenderness or mass.  Thyroid normal. Lungs:  Breath sounds clear and equal bilaterally.  No wheezes, rales or rhonchi. Heart:  Rhythm regular, without extrasystoles.  No gallops or murmers. Skin:  Clear,  warm and dry.  Assessment   The encounter diagnosis was Abscess of left ear canal.  Plan    1.  Meds:  The following meds were prescribed:   Discharge Medication List as of 07/19/2014  8:13 AM    START taking these medications   Details  cephALEXin (KEFLEX) 500 MG capsule Take 1 capsule (500 mg total) by mouth 3 (three) times daily., Starting 07/19/2014, Until Discontinued, Print    HYDROcodone-acetaminophen (NORCO/VICODIN) 5-325 MG per tablet 1 to 2 tabs every 4 to 6 hours as needed for pain., Print    sulfamethoxazole-trimethoprim (BACTRIM DS) 800-160 MG per tablet Take 1 tablet by mouth 2 (two) times daily., Starting 07/19/2014, Until Discontinued, Print        2.  Patient Education/Counseling:  The patient was given appropriate handouts, self care instructions, and instructed in symptomatic relief.   3.  Follow up:  The patient was told to follow up here if no better in 3 to 4 days, or sooner if becoming worse in any way, and given some red flag symptoms such as worsening pain or fever which would prompt immediate return.       Harden Mo, MD 07/19/14 [phone]

## 2015-07-27 ENCOUNTER — Encounter (HOSPITAL_COMMUNITY): Payer: Self-pay | Admitting: Emergency Medicine

## 2015-07-27 ENCOUNTER — Emergency Department (INDEPENDENT_AMBULATORY_CARE_PROVIDER_SITE_OTHER)
Admission: EM | Admit: 2015-07-27 | Discharge: 2015-07-27 | Disposition: A | Discharge status: Home / Self Care | Payer: Self-pay | Source: Home / Self Care | Attending: Family Medicine | Admitting: Family Medicine

## 2015-07-27 DIAGNOSIS — J069 Acute upper respiratory infection, unspecified: Secondary | ICD-10-CM

## 2015-07-27 DIAGNOSIS — H6983 Other specified disorders of Eustachian tube, bilateral: Secondary | ICD-10-CM

## 2015-07-27 LAB — POCT RAPID STREP A: Streptococcus, Group A Screen (Direct): NEGATIVE

## 2015-07-27 MED ORDER — IBUPROFEN 600 MG PO TABS: 600.0000 mg | ORAL_TABLET | Freq: Four times a day (QID) | ORAL | Status: DC | PRN

## 2015-07-27 MED ORDER — IPRATROPIUM BROMIDE 0.06 % NA SOLN: 2.0000 | Freq: Four times a day (QID) | NASAL | Status: DC

## 2015-07-27 NOTE — Discharge Instructions (Signed)
Your symptoms are from a viral illness causing inflammation of the eustachian tubes. Please consider the following medications, Motrin 600 mg every 6 hours, Rhinocort at night before bed, nasal Atrovent during the day for stuffy nose, a nightly allergy pill such as Zyrtec or Allegra. Your strep test was negative. You should get better in the next 1-3 days.

## 2015-07-27 NOTE — ED Provider Notes (Signed)
CSN: [id number]     Arrival date & time 07/27/15  1342 History   First MD Initiated Contact with Patient 07/27/15 1459     Chief Complaint  Patient presents with  . Otalgia  . Sore Throat   (Consider location/radiation/quality/duration/timing/severity/associated sxs/prior Treatment) HPI   Sore throat. Started 3 days ago. No change since onset. Associated with stuffy nose, and bilateral ear "fullness." Feels like her head is "stuck in a bottle. " Has not taken anything for her symptoms. Sore throat is nonradiating and sharp. Denies any shortness of breath, chest pain, palpitations, fevers, nausea, vomiting, neck stiffness, headache.    Past Medical History  Diagnosis Date  . Hypertension   . Anxiety   . Asthma   . Seasonal allergies   . 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 Tubal ligation  2009   Family History  Problem Relation Age of Onset  . Diabetes Mother   . Hypertension Mother   . Dementia Mother   . Diabetes Father   . Hypertension Father    Social History  Substance Use Topics  . Smoking status: Former Smoker    Quit date: 01/25/2007  . Smokeless tobacco: Never Used  . Alcohol Use: No   OB History    No data available     Review of Systems Per HPI with all other pertinent systems negative.   Allergies  Review of patient's allergies indicates no known allergies.  Home Medications   Prior to Admission medications   Medication Sig Start Date End Date Taking? Authorizing Provider  ibuprofen (ADVIL,MOTRIN) 600 MG tablet Take 1 tablet (600 mg total) by mouth every 6 (six) hours as needed. 07/27/15   Waldemar Dickens, MD  ipratropium (ATROVENT) 0.06 % nasal spray Place 2 sprays into both nostrils 4 (four) times daily. 07/27/15   Waldemar Dickens, MD  sulfamethoxazole-trimethoprim (BACTRIM DS) 800-160 MG per tablet Take 1 tablet by mouth 2 (two) times daily. 07/19/14   Harden Mo, MD   Meds Ordered and Administered this  Visit  Medications - No data to display  BP 133/91 mmHg  Pulse 88  Temp(Src) 98.8 F (37.1 C) (Oral)  Resp 16  SpO2 96%  LMP 07/05/2015 (Exact Date) No data found.   Physical Exam  Physical Exam  Constitutional: oriented to person, place, and time. appears well-developed and well-nourished. No distress.  HENT:  Head: Normocephalic and atraumatic.  Eyes: EOMI. PERRL.  TMs normal bilaterally, pharyngeal cobblestoning. Tonsils 0-1+ without exudate. Neck: Normal range of motion.  Cardiovascular: RRR, no m/r/g, 2+ distal pulses,  Pulmonary/Chest: Effort normal and breath sounds normal. No respiratory distress.  Abdominal: Soft. Bowel sounds are normal. NonTTP, no distension.  Musculoskeletal: Normal range of motion. Non ttp, no effusion.  Neurological: alert and oriented to person, place, and time.  Skin: Skin is warm. No rash noted. non diaphoretic.  Psychiatric: normal mood and affect. behavior is normal. Judgment and thought content normal.    ED Course  Procedures (including critical care time)  Labs Review Labs Reviewed  POCT RAPID STREP A    Imaging Review No results found.   Visual Acuity Review  Right Eye Distance:   Left Eye Distance:   Bilateral Distance:    Right Eye Near:   Left Eye Near:    Bilateral Near:         MDM   1. Eustachian tube dysfunction, bilateral   2. Viral URI  Motrin 600, Rhinocort, Zyrtec, nasal Atrovent. Rapid strep negative. Strep culture sent.    Waldemar Dickens, MD 07/27/15 [phone]

## 2015-07-27 NOTE — ED Notes (Signed)
Pt reports pain in her ears bilaterally and a sore throat for three days.

## 2015-07-29 LAB — CULTURE, GROUP A STREP: Strep A Culture: NEGATIVE

## 2015-08-09 NOTE — ED Notes (Signed)
Strep report negative, no further action required

## 2015-10-26 ENCOUNTER — Encounter (HOSPITAL_COMMUNITY): Payer: Self-pay

## 2015-10-26 ENCOUNTER — Emergency Department (HOSPITAL_COMMUNITY)
Admission: EM | Admit: 2015-10-26 | Discharge: 2015-10-26 | Disposition: A | Discharge status: Home / Self Care | Payer: Self-pay | Attending: Emergency Medicine | Admitting: Emergency Medicine

## 2015-10-26 ENCOUNTER — Telehealth: Payer: Self-pay

## 2015-10-26 DIAGNOSIS — Z79899 Other long term (current) drug therapy: Secondary | ICD-10-CM | POA: Insufficient documentation

## 2015-10-26 DIAGNOSIS — Z9851 Tubal ligation status: Secondary | ICD-10-CM | POA: Insufficient documentation

## 2015-10-26 DIAGNOSIS — J45909 Unspecified asthma, uncomplicated: Secondary | ICD-10-CM | POA: Insufficient documentation

## 2015-10-26 DIAGNOSIS — R739 Hyperglycemia, unspecified: Secondary | ICD-10-CM | POA: Insufficient documentation

## 2015-10-26 DIAGNOSIS — R102 Pelvic and perineal pain: Secondary | ICD-10-CM | POA: Insufficient documentation

## 2015-10-26 DIAGNOSIS — Z87891 Personal history of nicotine dependence: Secondary | ICD-10-CM | POA: Insufficient documentation

## 2015-10-26 DIAGNOSIS — I10 Essential (primary) hypertension: Secondary | ICD-10-CM | POA: Insufficient documentation

## 2015-10-26 DIAGNOSIS — Z8659 Personal history of other mental and behavioral disorders: Secondary | ICD-10-CM | POA: Insufficient documentation

## 2015-10-26 DIAGNOSIS — R1030 Lower abdominal pain, unspecified: Secondary | ICD-10-CM | POA: Insufficient documentation

## 2015-10-26 DIAGNOSIS — Z3202 Encounter for pregnancy test, result negative: Secondary | ICD-10-CM | POA: Insufficient documentation

## 2015-10-26 DIAGNOSIS — Z792 Long term (current) use of antibiotics: Secondary | ICD-10-CM | POA: Insufficient documentation

## 2015-10-26 LAB — URINALYSIS, ROUTINE W REFLEX MICROSCOPIC
Bilirubin Urine: NEGATIVE
HGB URINE DIPSTICK: NEGATIVE
KETONES UR: NEGATIVE mg/dL
Nitrite: NEGATIVE
PH: 5.5 (ref 5.0–8.0)
Protein, ur: NEGATIVE mg/dL
Specific Gravity, Urine: 1.03 (ref 1.005–1.030)

## 2015-10-26 LAB — CBC WITH DIFFERENTIAL/PLATELET
BASOS ABS: 0 10*3/uL (ref 0.0–0.1)
BASOS PCT: 0 %
EOS PCT: 2 %
Eosinophils Absolute: 0.1 10*3/uL (ref 0.0–0.7)
HCT: 40 % (ref 36.0–46.0)
Hemoglobin: 14.2 g/dL (ref 12.0–15.0)
Lymphocytes Relative: 29 %
Lymphs Abs: 2.1 10*3/uL (ref 0.7–4.0)
MCH: 29.6 pg (ref 26.0–34.0)
MCHC: 35.5 g/dL (ref 30.0–36.0)
MCV: 83.3 fL (ref 78.0–100.0)
MONO ABS: 0.4 10*3/uL (ref 0.1–1.0)
Monocytes Relative: 6 %
Neutro Abs: 4.5 10*3/uL (ref 1.7–7.7)
Neutrophils Relative %: 63 %
PLATELETS: 260 10*3/uL (ref 150–400)
RBC: 4.8 MIL/uL (ref 3.87–5.11)
RDW: 12.6 % (ref 11.5–15.5)
WBC: 7.1 10*3/uL (ref 4.0–10.5)

## 2015-10-26 LAB — BASIC METABOLIC PANEL
ANION GAP: 6 (ref 5–15)
BUN: 9 mg/dL (ref 6–20)
CALCIUM: 9 mg/dL (ref 8.9–10.3)
CO2: 24 mmol/L (ref 22–32)
Chloride: 106 mmol/L (ref 101–111)
Creatinine, Ser: 0.42 mg/dL — ABNORMAL LOW (ref 0.44–1.00)
Glucose, Bld: 229 mg/dL — ABNORMAL HIGH (ref 65–99)
Potassium: 4.1 mmol/L (ref 3.5–5.1)
SODIUM: 136 mmol/L (ref 135–145)

## 2015-10-26 LAB — PREGNANCY, URINE: Preg Test, Ur: NEGATIVE

## 2015-10-26 LAB — URINE MICROSCOPIC-ADD ON

## 2015-10-26 LAB — CBG MONITORING, ED: GLUCOSE-CAPILLARY: 214 mg/dL — AB (ref 65–99)

## 2015-10-26 MED ORDER — SODIUM CHLORIDE 0.9 % IV BOLUS (SEPSIS)
1000.0000 mL | Freq: Once | INTRAVENOUS | Status: AC
  Administered 2015-10-26: 1000 mL via INTRAVENOUS

## 2015-10-26 NOTE — ED Provider Notes (Signed)
CSN: [id number]     Arrival date & time 10/26/15  [id number] History   First MD Initiated Contact with Patient 10/26/15 [phone]     Chief Complaint  Patient presents with  . Dysuria    (Consider location/radiation/quality/duration/timing/severity/associated sxs/prior Treatment) Patient is a 39 y.o. female presenting with dysuria. The history is provided by the patient and the spouse. No language interpreter was used.  Dysuria Associated symptoms: no fever, no flank pain, no nausea, no vaginal discharge and no vomiting    Wendy Olson is a 39 year old female with a history of hypertension, asthma, and anxiety for presents for abdominal pressure, urinary frequency, and oliguria 2 weeks. She states that she was taking cranberry juice and water and thought that her symptoms were resolving but that they returned 3 days ago and are worse than they were 2 weeks ago. She reports having a UTI many years ago. Her pain is 7/10 now. He also reports intermittent blurry vision for the past 2 days. She denies any fever, chills, vomiting, back pain, dysuria, hematuria, vaginal discharge or bleeding. Her last menstrual period was one week ago.   Past Medical History  Diagnosis Date  . Hypertension   . Anxiety   . Asthma   . Seasonal allergies   . 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 Tubal ligation  2009   Family History  Problem Relation Age of Onset  . Diabetes Mother   . Hypertension Mother   . Dementia Mother   . Diabetes Father   . Hypertension Father    Social History  Substance Use Topics  . Smoking status: Former Smoker    Quit date: 01/25/2007  . Smokeless tobacco: Never Used  . Alcohol Use: No   OB History    No data available     Review of Systems  Constitutional: Negative for fever and chills.  Eyes: Positive for visual disturbance.  Gastrointestinal: Negative for nausea and vomiting.  Genitourinary: Positive for dysuria and pelvic pain.  Negative for flank pain and vaginal discharge.  All other systems reviewed and are negative.     Allergies  Lobster  Home Medications   Prior to Admission medications   Medication Sig Start Date End Date Taking? Authorizing Provider  aspirin-acetaminophen-caffeine (EXCEDRIN MIGRAINE) [phone] MG tablet Take 1 tablet by mouth 2 (two) times daily as needed for headache.   Yes Historical Provider, MD  diphenhydrAMINE (BENADRYL) 25 MG tablet Take 25 mg by mouth daily as needed for allergies or sleep.   Yes Historical Provider, MD  ipratropium (ATROVENT) 0.06 % nasal spray Place 2 sprays into both nostrils 4 (four) times daily. 07/27/15  Yes Waldemar Dickens, MD  ibuprofen (ADVIL,MOTRIN) 600 MG tablet Take 1 tablet (600 mg total) by mouth every 6 (six) hours as needed. 07/27/15   Waldemar Dickens, MD  sulfamethoxazole-trimethoprim (BACTRIM DS) 800-160 MG per tablet Take 1 tablet by mouth 2 (two) times daily. 07/19/14   Harden Mo, MD   BP 136/86 mmHg  Pulse 86  Temp(Src) 98 F (36.7 C) (Oral)  Resp 16  SpO2 99%  LMP 10/19/2015 Physical Exam  Constitutional: She is oriented to person, place, and time. She appears well-developed and well-nourished.  HENT:  Head: Normocephalic and atraumatic.  Eyes: Conjunctivae are normal.  Neck: Normal range of motion. Neck supple.  Cardiovascular: Normal rate, regular rhythm and normal heart sounds.   Pulmonary/Chest: Effort normal. No respiratory distress. She has no wheezes.  She has no rales.  Abdominal: Soft. There is tenderness in the suprapubic area.    Morbidly Obese. Suprapubic abdominal tenderness to palpation. No guarding or rebound. No abdominal distention. No CVA tenderness.  Musculoskeletal: Normal range of motion.  Neurological: She is alert and oriented to person, place, and time.  Skin: Skin is warm and dry.  Psychiatric: She has a normal mood and affect.  Nursing note and vitals reviewed.   ED Course  Procedures (including  critical care time) Labs Review Labs Reviewed  URINALYSIS, ROUTINE W REFLEX MICROSCOPIC (NOT AT [facility]) - Abnormal; Notable for the following:    APPearance CLOUDY (*)    Glucose, UA >1000 (*)    Leukocytes, UA SMALL (*)    All other components within normal limits  URINE MICROSCOPIC-ADD ON - Abnormal; Notable for the following:    Squamous Epithelial / LPF 6-30 (*)    Bacteria, UA FEW (*)    All other components within normal limits  BASIC METABOLIC PANEL - Abnormal; Notable for the following:    Glucose, Bld 229 (*)    Creatinine, Ser 0.42 (*)    All other components within normal limits  HEMOGLOBIN A1C - Abnormal; Notable for the following:    Hgb A1c MFr Bld 10.3 (*)    All other components within normal limits  CBG MONITORING, ED - Abnormal; Notable for the following:    Glucose-Capillary 214 (*)    All other components within normal limits  URINE CULTURE  PREGNANCY, URINE  CBC WITH DIFFERENTIAL/PLATELET    Imaging Review No results found. I have personally reviewed and evaluated these lab results as part of my medical decision-making.   EKG Interpretation None      MDM   Final diagnoses:  Hyperglycemia   Patient presents for urinary frequency, abdominal pressure, and blurry vision x 2 days. Her vitals are normal and she is well appearing. Her urinalysis has greater than 1000 glucose but she does not have a urinary tract infection. Labs were obtained to see if patient's glucose was high. She had a glucose of 229 and normal kidney function. I suspect she may be a type 2 diabetic but has never been diagnosed with this. She is well-appearing and in no acute distress. She is obese. I believe that she can be discharged with close follow-up and education on diet. I had social work see the patient to get a follow-up appointment which will be in 1 week at [place] [facility]. She was not prescribed any medications today. I discussed this patient with Dr. Ralene Bathe who agrees with  the plan. An A1c was also obtained so that she could follow-up with her doctor next week.    Ottie Glazier, PA-C 10/27/15 [place], MD 10/27/15 [phone]

## 2015-10-26 NOTE — Care Management Note (Addendum)
Case Management Note  Patient Details  Name: Wendy Olson MRN: [id number] Date of Birth: 12/13/1975  Subjective/Objective:         39 yr old uninsured [place] resident dx with Dm in [place] ED today per ED PA  Female visitor at bedside Pt listed being seen at [facility] urgent care in 06/2015 but confirms no permanent pcp Did not offer that husband can assist while cm assessed her Cm was informed by PA that pt may be able to get assist from "husband insurance"  Pt states she was asked by registration about her insurance coverage and she told them she had a 39 yr old and did not mention per pt the 39 yr old not living with her "at her grandmother's" Request Registration to return Pt informed CM she had medicaid family planning with her children, tried to apply for herself but was denied once         Action/Plan:  Refer to previous CM note Cm updated registration on pt request to be seen again Cm spoke with pt about  [facility] referral and pt was able to get an appt for 11/01/15 at 12 pm with [facility] for follow She agreed to the appt   Expected Discharge Date:    10/26/15               Expected Discharge Plan:  Home/Self Care  In-House Referral:  NA  Discharge planning Services  CM Consult, [facility], Medication Assistance  Post Acute Care Choice:  NA Choice offered to:  Patient     Additional Comments:  Robbie Lis, RN 10/26/2015, 12:49 PM

## 2015-10-26 NOTE — Telephone Encounter (Signed)
Message received from Joellyn Quails, RN CM requesting a hospital follow up appointment for the patient. An appointment was scheduled for 11/01/15 @ 1200 and the information was placed on the AVS.    Update provided to K. Lavina Hamman, RN CM

## 2015-10-26 NOTE — Discharge Instructions (Signed)
Hyperglycemia °High blood sugar (hyperglycemia) means that the level of sugar in your blood is higher than it should be. Signs of high blood sugar include: °· Feeling thirsty. °· Frequent peeing (urinating). °· Feeling tired or sleepy. °· Dry mouth. °· Vision changes. °· Feeling weak. °· Feeling hungry but losing weight. °· Numbness and tingling in your hands or feet. °· Headache. °When you ignore these signs, your blood sugar may keep going up. These problems may get worse, and other problems may begin. °HOME CARE °· Check your blood sugars as told by your doctor. Write down the numbers with the date and time. °· Take the right amount of insulin or diabetes pills at the right time. Write down the dose with date and time. °· Refill your insulin or diabetes pills before running out. °· Watch what you eat. Follow your meal plan. °· Drink liquids without sugar, such as water. Check with your doctor if you have kidney or heart disease. °· Follow your doctor's orders for exercise. Exercise at the same time of day. °· Keep your doctor's appointments. °GET HELP RIGHT AWAY IF:  °· You have trouble thinking or are confused. °· You have fast breathing with fruity smelling breath. °· You pass out (faint). °· You have 2 to 3 days of high blood sugars and you do not know why. °· You have chest pain. °· You are feeling sick to your stomach (nauseous) or throwing up (vomiting). °· You have sudden vision changes. °MAKE SURE YOU:  °· Understand these instructions. °· Will watch your condition. °· Will get help right away if you are not doing well or get worse. °  °This information is not intended to replace advice given to you by your health care provider. Make sure you discuss any questions you have with your health care provider. °  °Document Released: 09/09/2009 Document Revised: 12/03/2014 Document Reviewed: 07/19/2015 °Elsevier Interactive Patient Education ©2016 Elsevier Inc. ° °

## 2015-10-26 NOTE — Progress Notes (Signed)
PA reached out to [facility] inquiring about medications for patient. CSW informed PA that she would inform Nurse CM.  Willette Brace [id number] ED CSW 10/26/2015 12:16 PM

## 2015-10-26 NOTE — ED Notes (Signed)
Pt c/o dysuria, urinary frequency, and decreased output x 2 weeks.  Pain score 7/10.  Pt has been drinking water and cranberry juice to treat symptoms.  Denies vaginal discharge or bleeding.

## 2015-10-27 LAB — HEMOGLOBIN A1C
HEMOGLOBIN A1C: 10.3 % — AB (ref 4.8–5.6)
MEAN PLASMA GLUCOSE: 249 mg/dL

## 2015-10-27 LAB — URINE CULTURE: Special Requests: NORMAL

## 2015-11-01 ENCOUNTER — Ambulatory Visit: Payer: Self-pay | Attending: Family Medicine | Admitting: Family Medicine

## 2015-11-01 ENCOUNTER — Encounter: Payer: Self-pay | Admitting: Family Medicine

## 2015-11-01 ENCOUNTER — Encounter: Payer: Self-pay | Admitting: Pharmacist

## 2015-11-01 ENCOUNTER — Ambulatory Visit: Payer: MEDICAID | Attending: Family Medicine | Admitting: Pharmacist

## 2015-11-01 VITALS — BP 134/87 | HR 77 | Temp 98.7°F | Resp 13 | Ht 67.0 in | Wt 219.4 lb

## 2015-11-01 DIAGNOSIS — E1165 Type 2 diabetes mellitus with hyperglycemia: Secondary | ICD-10-CM

## 2015-11-01 DIAGNOSIS — Z6834 Body mass index (BMI) 34.0-34.9, adult: Secondary | ICD-10-CM | POA: Insufficient documentation

## 2015-11-01 DIAGNOSIS — R635 Abnormal weight gain: Secondary | ICD-10-CM | POA: Insufficient documentation

## 2015-11-01 DIAGNOSIS — Z7982 Long term (current) use of aspirin: Secondary | ICD-10-CM | POA: Insufficient documentation

## 2015-11-01 DIAGNOSIS — Z833 Family history of diabetes mellitus: Secondary | ICD-10-CM | POA: Insufficient documentation

## 2015-11-01 DIAGNOSIS — E119 Type 2 diabetes mellitus without complications: Secondary | ICD-10-CM | POA: Insufficient documentation

## 2015-11-01 LAB — GLUCOSE, POCT (MANUAL RESULT ENTRY): POC GLUCOSE: 225 mg/dL — AB (ref 70–99)

## 2015-11-01 MED ORDER — METFORMIN HCL 500 MG PO TABS: ORAL_TABLET | ORAL | Status: DC

## 2015-11-01 MED ORDER — TRUE METRIX METER DEVI: 1.0000 | Freq: Three times a day (TID) | Status: DC

## 2015-11-01 MED ORDER — TRUEPLUS LANCETS 28G MISC: 1.0000 | Freq: Three times a day (TID) | Status: DC

## 2015-11-01 MED ORDER — GLUCOSE BLOOD VI STRP: ORAL_STRIP | Status: DC

## 2015-11-01 MED ORDER — LISINOPRIL 2.5 MG PO TABS: 5.0000 mg | ORAL_TABLET | Freq: Every day | ORAL | Status: DC

## 2015-11-01 NOTE — Progress Notes (Signed)
Ed follow up-hyperglycemia CBG today 225 ate around 0730 Denies pain Reports polyuria Takes no medications

## 2015-11-01 NOTE — Patient Instructions (Signed)
Diabetes Mellitus and Food It is important for you to manage your blood sugar (glucose) level. Your blood glucose level can be greatly affected by what you eat. Eating healthier foods in the appropriate amounts throughout the day at about the same time each day will help you control your blood glucose level. It can also help slow or prevent worsening of your diabetes mellitus. Healthy eating may even help you improve the level of your blood pressure and reach or maintain a healthy weight.  General recommendations for healthful eating and cooking habits include:  Eating meals and snacks regularly. Avoid going long periods of time without eating to lose weight.  Eating a diet that consists mainly of plant-based foods, such as fruits, vegetables, nuts, legumes, and whole grains.  Using low-heat cooking methods, such as baking, instead of high-heat cooking methods, such as deep frying. Work with your dietitian to make sure you understand how to use the Nutrition Facts information on food labels. HOW CAN FOOD AFFECT ME? Carbohydrates Carbohydrates affect your blood glucose level more than any other type of food. Your dietitian will help you determine how many carbohydrates to eat at each meal and teach you how to count carbohydrates. Counting carbohydrates is important to keep your blood glucose at a healthy level, especially if you are using insulin or taking certain medicines for diabetes mellitus. Alcohol Alcohol can cause sudden decreases in blood glucose (hypoglycemia), especially if you use insulin or take certain medicines for diabetes mellitus. Hypoglycemia can be a life-threatening condition. Symptoms of hypoglycemia (sleepiness, dizziness, and disorientation) are similar to symptoms of having too much alcohol.  If your health care provider has given you approval to drink alcohol, do so in moderation and use the following guidelines:  Women should not have more than one drink per day, and men  should not have more than two drinks per day. One drink is equal to:  12 oz of beer.  5 oz of wine.  1 oz of hard liquor.  Do not drink on an empty stomach.  Keep yourself hydrated. Have water, diet soda, or unsweetened iced tea.  Regular soda, juice, and other mixers might contain a lot of carbohydrates and should be counted. WHAT FOODS ARE NOT RECOMMENDED? As you make food choices, it is important to remember that all foods are not the same. Some foods have fewer nutrients per serving than other foods, even though they might have the same number of calories or carbohydrates. It is difficult to get your body what it needs when you eat foods with fewer nutrients. Examples of foods that you should avoid that are high in calories and carbohydrates but low in nutrients include:  Trans fats (most processed foods list trans fats on the Nutrition Facts label).  Regular soda.  Juice.  Candy.  Sweets, such as cake, pie, doughnuts, and cookies.  Fried foods. WHAT FOODS CAN I EAT? Eat nutrient-rich foods, which will nourish your body and keep you healthy. The food you should eat also will depend on several factors, including:  The calories you need.  The medicines you take.  Your weight.  Your blood glucose level.  Your blood pressure level.  Your cholesterol level. You should eat a variety of foods, including:  Protein.  Lean cuts of meat.  Proteins low in saturated fats, such as fish, egg whites, and beans. Avoid processed meats.  Fruits and vegetables.  Fruits and vegetables that may help control blood glucose levels, such as apples, mangoes, and   yams.  Dairy products.  Choose fat-free or low-fat dairy products, such as milk, yogurt, and cheese.  Grains, bread, pasta, and rice.  Choose whole grain products, such as multigrain bread, whole oats, and brown rice. These foods may help control blood pressure.  Fats.  Foods containing healthful fats, such as nuts,  avocado, olive oil, canola oil, and fish. DOES EVERYONE WITH DIABETES MELLITUS HAVE THE SAME MEAL PLAN? Because every person with diabetes mellitus is different, there is not one meal plan that works for everyone. It is very important that you meet with a dietitian who will help you create a meal plan that is just right for you.   This information is not intended to replace advice given to you by your health care provider. Make sure you discuss any questions you have with your health care provider.   Document Released: 08/09/2005 Document Revised: 12/03/2014 Document Reviewed: 10/09/2013 Elsevier Interactive Patient Education 2016 Elsevier Inc.  

## 2015-11-01 NOTE — Progress Notes (Signed)
Subjective:  Patient ID: Wendy Olson, female    DOB: 15-Mar-1976  Age: 39 y.o. MRN: [id number]  CC: Follow-up   HPI JAIDEE MARANDA with newly diagnosed diabetes (A1c 10.7) who comes into the clinic for follow-up from the ED after being treated for urinary tract infection a week ago which she states has resolved. She endorses a family history of diabetes mellitus in her dad.  She has no complaints at this time and has CBG is 225 in the clinic today.  Outpatient Prescriptions Prior to Visit  Medication Sig Dispense Refill  . aspirin-acetaminophen-caffeine (EXCEDRIN MIGRAINE) 250-250-65 MG tablet Take 1 tablet by mouth 2 (two) times daily as needed for headache.    . diphenhydrAMINE (BENADRYL) 25 MG tablet Take 25 mg by mouth daily as needed for allergies or sleep.    Marland Kitchen ibuprofen (ADVIL,MOTRIN) 600 MG tablet Take 1 tablet (600 mg total) by mouth every 6 (six) hours as needed. (Patient not taking: Reported on 11/01/2015) 30 tablet 0  . ipratropium (ATROVENT) 0.06 % nasal spray Place 2 sprays into both nostrils 4 (four) times daily. (Patient not taking: Reported on 11/01/2015) 15 mL 12  . sulfamethoxazole-trimethoprim (BACTRIM DS) 800-160 MG per tablet Take 1 tablet by mouth 2 (two) times daily. (Patient not taking: Reported on 11/01/2015) 20 tablet 0   No facility-administered medications prior to visit.    ROS Review of Systems  Constitutional: Negative for activity change, appetite change and fatigue.  HENT: Negative for congestion, sinus pressure and sore throat.   Eyes: Negative for visual disturbance.  Respiratory: Negative for cough, chest tightness, shortness of breath and wheezing.   Cardiovascular: Negative for chest pain and palpitations.  Gastrointestinal: Negative for abdominal pain, constipation and abdominal distention.  Endocrine: Negative for polydipsia.  Genitourinary: Negative for dysuria and frequency.  Musculoskeletal: Negative for back pain and arthralgias.    Skin: Negative for rash.  Neurological: Negative for tremors, light-headedness and numbness.  Hematological: Does not bruise/bleed easily.  Psychiatric/Behavioral: Negative for behavioral problems and agitation.    Objective:  BP 134/87 mmHg  Pulse 77  Temp(Src) 98.7 F (37.1 C)  Resp 13  Ht 5\' 7"  (1.702 m)  Wt 219 lb 6.4 oz (99.519 kg)  BMI 34.35 kg/m2  SpO2 96%  LMP 10/19/2015  BP/Weight 11/01/2015 10/26/2015 99991111  Systolic BP Q000111Q [id number] Q000111Q  Diastolic BP 87 86 91  Wt. (Lbs) 219.4 - -  BMI 34.35 - -    Lab Results  Component Value Date   HGBA1C 10.3* 10/26/2015    CMP Latest Ref Rng 10/26/2015 12/04/2007  Glucose 65 - 99 mg/dL 229(H) 89  BUN 6 - 20 mg/dL 9 9  Creatinine 0.44 - 1.00 mg/dL 0.42(L) 0.54  Sodium 135 - 145 mmol/L 136 135  Potassium 3.5 - 5.1 mmol/L 4.1 3.5  Chloride 101 - 111 mmol/L 106 107  CO2 22 - 32 mmol/L 24 18(L)  Calcium 8.9 - 10.3 mg/dL 9.0 8.6  Total Protein - - 5.9(L)  Total Bilirubin - - 0.4  Alkaline Phos - - 84  AST - - 11  ALT - - <8      Physical Exam  Constitutional: She is oriented to person, place, and time. She appears well-developed and well-nourished.  Overweight  Cardiovascular: Normal rate, normal heart sounds and intact distal pulses.   No murmur heard. Pulmonary/Chest: Effort normal and breath sounds normal. She has no wheezes. She has no rales. She exhibits no tenderness.  Abdominal: Soft. Bowel sounds  are normal. She exhibits no distension and no mass. There is no tenderness.  Musculoskeletal: Normal range of motion.  Neurological: She is alert and oriented to person, place, and time.     Assessment & Plan:   1. Type 2 diabetes mellitus with hyperglycemia, without long-term current use of insulin ([place]) Newly diagnosed with A1c of 10.7. I have discussed the diagnosis with the patient extensively including management, lifestyle modifications (exercise, ADA diet, weight loss). She will be commenced on oral  hypoglycemic agents starting with metformin after which her blood sugar log will be reviewed and the need for addition of glipizide will be determined at her next office visit. She will be seeing the clinical pharmacist later today for further diabetic education. Low-dose ACE inhibitor added for renal protection. Foot exam, Pneumovax, I examined the be discussed at her next office visit as she is currently overwhelmed with the new diagnosis - Glucose (CBG) - metFORMIN (GLUCOPHAGE) 500 MG tablet; 1 tablet (500 mg) by mouth twice daily for one week then 2 tabs (1000 mg) twice daily  Dispense: 120 tablet; Refill: 3 - lisinopril (PRINIVIL,ZESTRIL) 2.5 MG tablet; Take 2 tablets (5 mg total) by mouth daily.  Dispense: 30 tablet; Refill: 3 - glucose blood (TRUE METRIX BLOOD GLUCOSE TEST) test strip; Use as directed 3 times daily before meals  Dispense: 100 each; Refill: 12 - Blood Glucose Monitoring Suppl (TRUE METRIX METER) DEVI; 1 each by Does not apply route 3 (three) times daily before meals.  Dispense: 1 Device; Refill: 0 - TRUEPLUS LANCETS 28G MISC; 1 each by Does not apply route 3 (three) times daily before meals.  Dispense: 100 each; Refill: 12   Meds ordered this encounter  Medications  . metFORMIN (GLUCOPHAGE) 500 MG tablet    Sig: 1 tablet (500 mg) by mouth twice daily for one week then 2 tabs (1000 mg) twice daily    Dispense:  120 tablet    Refill:  3  . lisinopril (PRINIVIL,ZESTRIL) 2.5 MG tablet    Sig: Take 2 tablets (5 mg total) by mouth daily.    Dispense:  30 tablet    Refill:  3  . glucose blood (TRUE METRIX BLOOD GLUCOSE TEST) test strip    Sig: Use as directed 3 times daily before meals    Dispense:  100 each    Refill:  12  . Blood Glucose Monitoring Suppl (TRUE METRIX METER) DEVI    Sig: 1 each by Does not apply route 3 (three) times daily before meals.    Dispense:  1 Device    Refill:  0  . TRUEPLUS LANCETS 28G MISC    Sig: 1 each by Does not apply route 3 (three)  times daily before meals.    Dispense:  100 each    Refill:  12    Follow-up: Return in about 2 weeks (around 11/15/2015) for Follow-up of diabetes mellitus; place on Stacey's schedule for today.Arnoldo Morale MD

## 2015-11-01 NOTE — Progress Notes (Signed)
S:    Patient arrives in good spirits.  Presents for diabetes education Patient reports Diabetes is newly diagnosed.  Patient has not been started on any medication for her diabetes.  Patient denies hypoglycemic events.  Patient reported dietary habits: doesn't follow any particular diet.  Patient reported exercise habits: none   Patient reports nocturia.  Patient reports neuropathy. Patient reports chronic visual changes (blurriness). Patient denies self foot exams.   She reports that both her father and mother have type 2 diabetes. She reports that she has played around on their meters before to check her blood sugar.    O:  Lab Results  Component Value Date   HGBA1C 10.3* 10/26/2015    A/P: Diabetes newly diagnosed currently uncontrolled based on A1c of 10.3.   Patient denies hypoglycemic events and is able to verbalize appropriate hypoglycemia management plan. Control is suboptimal due to dietary indiscretion and sedentary lifestyle.  Medication has been prescribed by Dr. Jarold Song. I provided diabetes education on A1c, hypo/hyperglycemia, blood glucose goals, metformin, diet (plate method, carbs education), exercise, and blood glucose monitoring. Patient was provided handouts for all of these. She verbalized understanding of what she learned. She appears to be motivated to make changes and to have her parents make these changes with her.   Next A1C anticipated February 2017.    Written patient instructions provided.  Total time in face to face counseling 35 minutes.  Follow up in Pharmacist Clinic Visit as needed.

## 2015-11-12 ENCOUNTER — Encounter (HOSPITAL_COMMUNITY): Payer: Self-pay

## 2015-11-12 ENCOUNTER — Emergency Department (HOSPITAL_COMMUNITY): Payer: Worker's Compensation

## 2015-11-12 ENCOUNTER — Emergency Department (HOSPITAL_COMMUNITY)
Admission: EM | Admit: 2015-11-12 | Discharge: 2015-11-12 | Disposition: A | Discharge status: Home / Self Care | Payer: Worker's Compensation | Attending: Emergency Medicine | Admitting: Emergency Medicine

## 2015-11-12 DIAGNOSIS — Y9289 Other specified places as the place of occurrence of the external cause: Secondary | ICD-10-CM | POA: Diagnosis not present

## 2015-11-12 DIAGNOSIS — Y990 Civilian activity done for income or pay: Secondary | ICD-10-CM | POA: Insufficient documentation

## 2015-11-12 DIAGNOSIS — J45909 Unspecified asthma, uncomplicated: Secondary | ICD-10-CM | POA: Diagnosis not present

## 2015-11-12 DIAGNOSIS — Z8659 Personal history of other mental and behavioral disorders: Secondary | ICD-10-CM | POA: Insufficient documentation

## 2015-11-12 DIAGNOSIS — S99911A Unspecified injury of right ankle, initial encounter: Secondary | ICD-10-CM | POA: Diagnosis not present

## 2015-11-12 DIAGNOSIS — Y9389 Activity, other specified: Secondary | ICD-10-CM | POA: Diagnosis not present

## 2015-11-12 DIAGNOSIS — I10 Essential (primary) hypertension: Secondary | ICD-10-CM | POA: Diagnosis not present

## 2015-11-12 DIAGNOSIS — M25571 Pain in right ankle and joints of right foot: Secondary | ICD-10-CM

## 2015-11-12 DIAGNOSIS — Z87891 Personal history of nicotine dependence: Secondary | ICD-10-CM | POA: Diagnosis not present

## 2015-11-12 DIAGNOSIS — W1843XA Slipping, tripping and stumbling without falling due to stepping from one level to another, initial encounter: Secondary | ICD-10-CM | POA: Diagnosis not present

## 2015-11-12 DIAGNOSIS — Z79899 Other long term (current) drug therapy: Secondary | ICD-10-CM | POA: Insufficient documentation

## 2015-11-12 DIAGNOSIS — S99921A Unspecified injury of right foot, initial encounter: Secondary | ICD-10-CM | POA: Insufficient documentation

## 2015-11-12 DIAGNOSIS — E119 Type 2 diabetes mellitus without complications: Secondary | ICD-10-CM | POA: Diagnosis not present

## 2015-11-12 DIAGNOSIS — D849 Immunodeficiency, unspecified: Secondary | ICD-10-CM | POA: Diagnosis not present

## 2015-11-12 HISTORY — DX: Type 2 diabetes mellitus without complications: E11.9

## 2015-11-12 NOTE — Discharge Instructions (Signed)
Read the information below.  You may return to the Emergency Department at any time for worsening condition or any new symptoms that concern you.  If you develop uncontrolled pain, weakness or numbness of the extremity, severe discoloration of the skin, or you are unable to walk or move your toes or ankle, return to the ER for a recheck.      Ankle Pain Ankle pain is a common symptom. The bones, cartilage, tendons, and muscles of the ankle joint perform a lot of work each day. The ankle joint holds your body weight and allows you to move around. Ankle pain can occur on either side or back of 1 or both ankles. Ankle pain may be sharp and burning or dull and aching. There may be tenderness, stiffness, redness, or warmth around the ankle. The pain occurs more often when a person walks or puts pressure on the ankle. CAUSES  There are many reasons ankle pain can develop. It is important to work with your caregiver to identify the cause since many conditions can impact the bones, cartilage, muscles, and tendons. Causes for ankle pain include:  Injury, including a break (fracture), sprain, or strain often due to a fall, sports, or a high-impact activity.  Swelling (inflammation) of a tendon (tendonitis).  Achilles tendon rupture.  Ankle instability after repeated sprains and strains.  Poor foot alignment.  Pressure on a nerve (tarsal tunnel syndrome).  Arthritis in the ankle or the lining of the ankle.  Crystal formation in the ankle (gout or pseudogout). DIAGNOSIS  A diagnosis is based on your medical history, your symptoms, results of your physical exam, and results of diagnostic tests. Diagnostic tests may include X-ray exams or a computerized magnetic scan (magnetic resonance imaging, MRI). TREATMENT  Treatment will depend on the cause of your ankle pain and may include:  Keeping pressure off the ankle and limiting activities.  Using crutches or other walking support (a cane or  brace).  Using rest, ice, compression, and elevation.  Participating in physical therapy or home exercises.  Wearing shoe inserts or special shoes.  Losing weight.  Taking medications to reduce pain or swelling or receiving an injection.  Undergoing surgery. HOME CARE INSTRUCTIONS   Only take over-the-counter or prescription medicines for pain, discomfort, or fever as directed by your caregiver.  Put ice on the injured area.  Put ice in a plastic bag.  Place a towel between your skin and the bag.  Leave the ice on for 15-20 minutes at a time, 03-04 times a day.  Keep your leg raised (elevated) when possible to lessen swelling.  Avoid activities that cause ankle pain.  Follow specific exercises as directed by your caregiver.  Record how often you have ankle pain, the location of the pain, and what it feels like. This information may be helpful to you and your caregiver.  Ask your caregiver about returning to work or sports and whether you should drive.  Follow up with your caregiver for further examination, therapy, or testing as directed. SEEK MEDICAL CARE IF:   Pain or swelling continues or worsens beyond 1 week.  You have an oral temperature above 102 F (38.9 C).  You are feeling unwell or have chills.  You are having an increasingly difficult time with walking.  You have loss of sensation or other new symptoms.  You have questions or concerns. MAKE SURE YOU:   Understand these instructions.  Will watch your condition.  Will get help right away if  you are not doing well or get worse.   This information is not intended to replace advice given to you by your health care provider. Make sure you discuss any questions you have with your health care provider.   Document Released: 05/02/2010 Document Revised: 02/04/2012 Document Reviewed: 06/14/2015 Elsevier Interactive Patient Education 2016 [employer]  Cryotherapy Cryotherapy is when you put ice on  your injury. Ice helps lessen pain and puffiness (swelling) after an injury. Ice works the best when you start using it in the first 24 to 48 hours after an injury. HOME CARE  Put a dry or damp towel between the ice pack and your skin.  You may press gently on the ice pack.  Leave the ice on for no more than 10 to 20 minutes at a time.  Check your skin after 5 minutes to make sure your skin is okay.  Rest at least 20 minutes between ice pack uses.  Stop using ice when your skin loses feeling (numbness).  Do not use ice on someone who cannot tell you when it hurts. This includes small children and people with memory problems (dementia). GET HELP RIGHT AWAY IF:  You have white spots on your skin.  Your skin turns blue or pale.  Your skin feels waxy or hard.  Your puffiness gets worse. MAKE SURE YOU:   Understand these instructions.  Will watch your condition.  Will get help right away if you are not doing well or get worse.   This information is not intended to replace advice given to you by your health care provider. Make sure you discuss any questions you have with your health care provider.   Document Released: 04/30/2008 Document Revised: 02/04/2012 Document Reviewed: 07/05/2011 Elsevier Interactive Patient Education [employer].

## 2015-11-12 NOTE — ED Provider Notes (Signed)
CSN: [id number]     Arrival date & time 11/12/15  1412 History   By signing my name below, I, Evelene Croon, attest that this documentation has been prepared under the direction and in the presence of non-physician practitioner, Clayton Bibles, PA-C. Electronically Signed: Evelene Croon, Scribe. 11/12/2015. 3:03 PM.    Chief Complaint  Patient presents with  . Foot Pain    The history is provided by the patient. No language interpreter was used.    HPI Comments:  Wendy Olson is a 39 y.o. female who presents to the Emergency Department complaining of moderate constant right foot and ankle pain following injury this afternoon. Pt stepped off a curb and felt a pop in her foot. She notes her pain is exacerbated when she applied pressure. She denies numbness/tingling to the foot. No alleviating factors noted.  Denies other injury.     Past Medical History  Diagnosis Date  . Hypertension   . Anxiety   . Asthma   . Seasonal allergies   . 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 Diabetes mellitus without complication ([place])     type 2   Past Surgical History  Procedure Laterality Date  . Tubal ligation  2009   Family History  Problem Relation Age of Onset  . Diabetes Mother   . Hypertension Mother   . Dementia Mother   . Diabetes Father   . Hypertension Father    Social History  Substance Use Topics  . Smoking status: Former Smoker    Quit date: 01/25/2007  . Smokeless tobacco: Never Used  . Alcohol Use: No   OB History    No data available     Review of Systems  Constitutional: Negative for fever and chills.  Musculoskeletal: Positive for arthralgias.       Right foot  Skin: Negative for color change and wound.  Allergic/Immunologic: Positive for immunocompromised state (diabetes ).  Neurological: Negative for weakness and numbness.  Hematological: Does not bruise/bleed easily.  Psychiatric/Behavioral: Negative for self-injury (accidental ).    Allergies  Lobster  Home  Medications   Prior to Admission medications   Medication Sig Start Date End Date Taking? Authorizing Provider  aspirin-acetaminophen-caffeine (EXCEDRIN MIGRAINE) [phone] MG tablet Take 1 tablet by mouth 2 (two) times daily as needed for headache.    Historical Provider, MD  Blood Glucose Monitoring Suppl (TRUE METRIX METER) DEVI 1 each by Does not apply route 3 (three) times daily before meals. 11/01/15   Arnoldo Morale, MD  diphenhydrAMINE (BENADRYL) 25 MG tablet Take 25 mg by mouth daily as needed for allergies or sleep.    Historical Provider, MD  glucose blood (TRUE METRIX BLOOD GLUCOSE TEST) test strip Use as directed 3 times daily before meals 11/01/15   Arnoldo Morale, MD  lisinopril (PRINIVIL,ZESTRIL) 2.5 MG tablet Take 2 tablets (5 mg total) by mouth daily. 11/01/15   Arnoldo Morale, MD  metFORMIN (GLUCOPHAGE) 500 MG tablet 1 tablet (500 mg) by mouth twice daily for one week then 2 tabs (1000 mg) twice daily 11/01/15   Arnoldo Morale, MD  TRUEPLUS LANCETS 28G MISC 1 each by Does not apply route 3 (three) times daily before meals. 11/01/15   Arnoldo Morale, MD   BP 137/84 mmHg  Pulse 109  Temp(Src) 98 F (36.7 C) (Oral)  Resp 18  SpO2 98%  LMP 11/12/2015 Physical Exam  Constitutional: She appears well-developed and well-nourished. No distress.  HENT:  Head: Normocephalic and atraumatic.  Neck: Neck supple.  Pulmonary/Chest: Effort normal.  Musculoskeletal:  TTP right posterolateral malleolus TTP dorsal right foot over 1st metatarsal Full active range of motion of all toes, strength 5/5, sensation intact, capillary refill < 2 seconds.    Neurological: She is alert.  Skin: She is not diaphoretic.  Nursing note and vitals reviewed.   ED Course  Procedures   DIAGNOSTIC STUDIES:  Oxygen Saturation is 98% on RA, normal by my interpretation.    COORDINATION OF CARE:  2:59 PM Discussed treatment plan with pt at bedside and pt agreed to plan.  Labs Review Labs Reviewed - No data to  display  Imaging Review Dg Ankle Complete Right  11/12/2015  CLINICAL DATA:  Twisting injury with pain EXAM: RIGHT ANKLE - COMPLETE 3+ VIEW COMPARISON:  None. FINDINGS: There is no evidence of fracture, dislocation, or joint effusion. There is no evidence of arthropathy or other focal bone abnormality. Soft tissues are unremarkable. IMPRESSION: No acute abnormality noted. Electronically Signed   By: Inez Catalina M.D.   On: 11/12/2015 15:32   I have personally reviewed and evaluated these images as part of my medical decision-making.    MDM   Final diagnoses:  Right ankle pain   Afebrile, nontoxic patient with injury to her right ankle while stepping off a curb.  Neurovascularly intact.   Xray negative.   D/C home with ASO, crutches, pcp follow up.  Discussed result, findings, treatment, and follow up  with patient.  Pt given return precautions.  Pt verbalizes understanding and agrees with plan.      I personally performed the services described in this documentation, which was scribed in my presence. The recorded information has been reviewed and is accurate.    Clayton Bibles, PA-C 11/12/15 1628  Veryl Speak, MD 11/13/15 [phone]

## 2015-11-12 NOTE — ED Notes (Signed)
Pt was at work and was taking food out to a customer.  Went to step off curb and turned her RT foot/ankle.  Heard it pop.  Pt states it is difficult to bear weight.

## 2015-11-16 ENCOUNTER — Ambulatory Visit: Payer: Self-pay | Admitting: Family Medicine

## 2015-11-18 ENCOUNTER — Encounter (HOSPITAL_BASED_OUTPATIENT_CLINIC_OR_DEPARTMENT_OTHER): Payer: Self-pay | Admitting: Emergency Medicine

## 2015-12-14 ENCOUNTER — Encounter: Payer: Self-pay | Admitting: Family Medicine

## 2015-12-14 ENCOUNTER — Ambulatory Visit: Payer: Self-pay | Attending: Family Medicine | Admitting: Family Medicine

## 2015-12-14 VITALS — BP 145/92 | HR 82 | Temp 98.3°F | Resp 13 | Ht 67.0 in | Wt 217.2 lb

## 2015-12-14 DIAGNOSIS — R739 Hyperglycemia, unspecified: Secondary | ICD-10-CM

## 2015-12-14 DIAGNOSIS — Z7982 Long term (current) use of aspirin: Secondary | ICD-10-CM | POA: Insufficient documentation

## 2015-12-14 DIAGNOSIS — K0889 Other specified disorders of teeth and supporting structures: Secondary | ICD-10-CM | POA: Insufficient documentation

## 2015-12-14 DIAGNOSIS — Z79899 Other long term (current) drug therapy: Secondary | ICD-10-CM | POA: Insufficient documentation

## 2015-12-14 DIAGNOSIS — Z23 Encounter for immunization: Secondary | ICD-10-CM | POA: Insufficient documentation

## 2015-12-14 DIAGNOSIS — I10 Essential (primary) hypertension: Secondary | ICD-10-CM | POA: Insufficient documentation

## 2015-12-14 DIAGNOSIS — Z0000 Encounter for general adult medical examination without abnormal findings: Secondary | ICD-10-CM

## 2015-12-14 DIAGNOSIS — E1165 Type 2 diabetes mellitus with hyperglycemia: Secondary | ICD-10-CM | POA: Insufficient documentation

## 2015-12-14 DIAGNOSIS — Z7984 Long term (current) use of oral hypoglycemic drugs: Secondary | ICD-10-CM | POA: Insufficient documentation

## 2015-12-14 LAB — GLUCOSE, POCT (MANUAL RESULT ENTRY): POC GLUCOSE: 314 mg/dL — AB (ref 70–99)

## 2015-12-14 LAB — POCT URINALYSIS DIPSTICK
BILIRUBIN UA: NEGATIVE
Blood, UA: NEGATIVE
Glucose, UA: 500
KETONES UA: NEGATIVE
LEUKOCYTES UA: NEGATIVE
Nitrite, UA: NEGATIVE
PH UA: 5.5
Protein, UA: NEGATIVE
SPEC GRAV UA: 1.02
Urobilinogen, UA: 0.2

## 2015-12-14 LAB — POCT GLYCOSYLATED HEMOGLOBIN (HGB A1C): HEMOGLOBIN A1C: 8.5

## 2015-12-14 MED ORDER — LISINOPRIL 5 MG PO TABS: 5.0000 mg | ORAL_TABLET | Freq: Every day | ORAL | Status: DC

## 2015-12-14 MED ORDER — GLIPIZIDE 5 MG PO TABS: 5.0000 mg | ORAL_TABLET | Freq: Two times a day (BID) | ORAL | Status: DC

## 2015-12-14 MED ORDER — METFORMIN HCL 500 MG PO TABS: ORAL_TABLET | ORAL | Status: DC

## 2015-12-14 MED ORDER — AMOXICILLIN 500 MG PO CAPS: 500.0000 mg | ORAL_CAPSULE | Freq: Three times a day (TID) | ORAL | Status: DC

## 2015-12-14 MED ORDER — IBUPROFEN 800 MG PO TABS: 800.0000 mg | ORAL_TABLET | Freq: Three times a day (TID) | ORAL | Status: DC | PRN

## 2015-12-14 MED ORDER — GLUCOSE BLOOD VI STRP: ORAL_STRIP | Status: DC

## 2015-12-14 MED FILL — ?METFORMIN HCL 500MG TABLET: 500 | 30 days supply | Qty: 120 | Fill #0

## 2015-12-14 MED FILL — IBUPROFEN 800 MG TABLET: 800 | 20 days supply | Qty: 60 | Fill #0

## 2015-12-14 MED FILL — AMOXICILLIN 500 MG CAPSULE: 500 | 10 days supply | Qty: 30 | Fill #0

## 2015-12-14 MED FILL — TRUE METRIX TEST STRIP: 33 days supply | Qty: 100 | Fill #0

## 2015-12-14 MED FILL — LISINOPRIL 5 MG TABLET: 5 | 30 days supply | Qty: 30 | Fill #0

## 2015-12-14 MED FILL — ?GLIPIZIDE 5MG TABLET: 5 | 30 days supply | Qty: 60 | Fill #0

## 2015-12-14 NOTE — Progress Notes (Signed)
Patient here for follow up on DM2 She report left upper tooth pain She has been out of her lisinopril and test strips for 1 month

## 2015-12-14 NOTE — Patient Instructions (Signed)

## 2015-12-14 NOTE — Progress Notes (Signed)
Subjective:  Patient ID: Wendy Olson, female    DOB: 23-Aug-1976  Age: 40 y.o. MRN: [id number]  CC: Diabetes   [name] is a 40 year old female with a history of type 2 diabetes mellitus (A1c 8.5), hypertension who comes into the clinic complaining of cracked left upper canine tooth and would like to be referred to a dentist. She has not been compliant with her medications and is requesting a refill of lisinopril and test strips today. Her blood sugar is 320 she informs me she last ate at 12:15 AM this morning. She does not have her blood sugar log with her. She has not been compliant with an exercise regimen or low-sodium diet a diabetic diet.  Outpatient Prescriptions Prior to Visit  Medication Sig Dispense Refill  . TRUEPLUS LANCETS 28G MISC 1 each by Does not apply route 3 (three) times daily before meals. 100 each 12  . lisinopril (PRINIVIL,ZESTRIL) 2.5 MG tablet Take 2 tablets (5 mg total) by mouth daily. 30 tablet 3  . metFORMIN (GLUCOPHAGE) 500 MG tablet 1 tablet (500 mg) by mouth twice daily for one week then 2 tabs (1000 mg) twice daily 120 tablet 3  . aspirin-acetaminophen-caffeine (EXCEDRIN MIGRAINE) 250-250-65 MG tablet Take 1 tablet by mouth 2 (two) times daily as needed for headache. Reported on 12/14/2015    . Blood Glucose Monitoring Suppl (TRUE METRIX METER) DEVI 1 each by Does not apply route 3 (three) times daily before meals. (Patient not taking: Reported on 12/14/2015) 1 Device 0  . diphenhydrAMINE (BENADRYL) 25 MG tablet Take 25 mg by mouth daily as needed for allergies or sleep. Reported on 12/14/2015    . glucose blood (TRUE METRIX BLOOD GLUCOSE TEST) test strip Use as directed 3 times daily before meals (Patient not taking: Reported on 12/14/2015) 100 each 12   No facility-administered medications prior to visit.    ROS Review of Systems  Constitutional: Negative for activity change, appetite change and fatigue.  HENT: Negative for congestion,  sinus pressure and sore throat.   Eyes: Negative for visual disturbance.  Respiratory: Negative for cough, chest tightness, shortness of breath and wheezing.   Cardiovascular: Negative for chest pain and palpitations.  Gastrointestinal: Negative for abdominal pain, constipation and abdominal distention.  Endocrine: Negative for polydipsia.  Genitourinary: Negative for dysuria and frequency.  Musculoskeletal: Negative for back pain and arthralgias.  Skin: Negative for rash.  Neurological: Negative for tremors, light-headedness and numbness.  Hematological: Does not bruise/bleed easily.  Psychiatric/Behavioral: Negative for behavioral problems and agitation.    Objective:  BP 145/92 mmHg  Pulse 82  Temp(Src) 98.3 F (36.8 C)  Resp 13  Ht 5\' 7"  (1.702 m)  Wt 217 lb 3.2 oz (98.521 kg)  BMI 34.01 kg/m2  SpO2 97%  LMP 12/12/2015  BP/Weight 12/14/2015 11/12/2015 99991111  Systolic BP Q000111Q 0000000 Q000111Q  Diastolic BP 92 84 87  Wt. (Lbs) 217.2 - 219.4  BMI 34.01 - 34.35      Physical Exam  Constitutional: She is oriented to person, place, and time. She appears well-developed and well-nourished.  HENT:  Cracked left upper canine tooth  Cardiovascular: Normal rate, normal heart sounds and intact distal pulses.   No murmur heard. Pulmonary/Chest: Effort normal and breath sounds normal. She has no wheezes. She has no rales. She exhibits no tenderness.  Abdominal: Soft. Bowel sounds are normal. She exhibits no distension and no mass. There is no tenderness.  Musculoskeletal: Normal range of motion.  Neurological: She  is alert and oriented to person, place, and time.     Assessment & Plan:   1. Type 2 diabetes mellitus with hyperglycemia, without long-term current use of insulin (HCC) A1c is 8.5, down from 10.7. CBGs elevated at 314 but this is random and patient eat not long ago. I will add glipizide to regimen Foot exam performed today and Pneumovax given and she has been advised to  schedule an annual eye exam with an optometrist or ophthalmologist. - HgB A1c - Glucose (CBG) - glipiZIDE (GLUCOTROL) 5 MG tablet; Take 1 tablet (5 mg total) by mouth 2 (two) times daily before a meal.  Dispense: 60 tablet; Refill: 3 - metFORMIN (GLUCOPHAGE) 500 MG tablet; 1 tablet (500 mg) by mouth twice daily for one week then 2 tabs (1000 mg) twice daily  Dispense: 120 tablet; Refill: 3 - glucose blood (TRUE METRIX BLOOD GLUCOSE TEST) test strip; Use as directed 3 times daily before meals  Dispense: 100 each; Refill: 12  2. Hyperglycemia - Urinalysis Dipstick  3. Healthcare maintenance -Flu Vaccine QUAD 36+ mos PF IM (Fluarix & Fluzone Quad PF)  4. Essential hypertension Blood pressure is mildly elevated above goal of less than 140/90 - lisinopril (PRINIVIL,ZESTRIL) 5 MG tablet; Take 1 tablet (5 mg total) by mouth daily.  Dispense: 30 tablet; Refill: 3  5. Tooth ache Community resources for dentist given to the patient as she has no medical coverage. - amoxicillin (AMOXIL) 500 MG capsule; Take 1 capsule (500 mg total) by mouth 3 (three) times daily.  Dispense: 30 capsule; Refill: 0 - ibuprofen (ADVIL,MOTRIN) 800 MG tablet; Take 1 tablet (800 mg total) by mouth every 8 (eight) hours as needed.  Dispense: 60 tablet; Refill: 0   Meds ordered this encounter  Medications  . glipiZIDE (GLUCOTROL) 5 MG tablet    Sig: Take 1 tablet (5 mg total) by mouth 2 (two) times daily before a meal.    Dispense:  60 tablet    Refill:  3  . amoxicillin (AMOXIL) 500 MG capsule    Sig: Take 1 capsule (500 mg total) by mouth 3 (three) times daily.    Dispense:  30 capsule    Refill:  0  . ibuprofen (ADVIL,MOTRIN) 800 MG tablet    Sig: Take 1 tablet (800 mg total) by mouth every 8 (eight) hours as needed.    Dispense:  60 tablet    Refill:  0  . lisinopril (PRINIVIL,ZESTRIL) 5 MG tablet    Sig: Take 1 tablet (5 mg total) by mouth daily.    Dispense:  30 tablet    Refill:  3  . metFORMIN  (GLUCOPHAGE) 500 MG tablet    Sig: 1 tablet (500 mg) by mouth twice daily for one week then 2 tabs (1000 mg) twice daily    Dispense:  120 tablet    Refill:  3  . glucose blood (TRUE METRIX BLOOD GLUCOSE TEST) test strip    Sig: Use as directed 3 times daily before meals    Dispense:  100 each    Refill:  12    Follow-up: Return in about 1 month (around 01/14/2016) for Follow-up of diabetes mellitus.Arnoldo Morale MD

## 2015-12-25 ENCOUNTER — Encounter (HOSPITAL_COMMUNITY): Payer: Self-pay | Admitting: Family Medicine

## 2015-12-25 ENCOUNTER — Emergency Department (HOSPITAL_COMMUNITY): Payer: Self-pay

## 2015-12-25 ENCOUNTER — Emergency Department (HOSPITAL_COMMUNITY)
Admission: EM | Admit: 2015-12-25 | Discharge: 2015-12-26 | Disposition: A | Discharge status: Home / Self Care | Payer: Self-pay | Attending: Emergency Medicine | Admitting: Emergency Medicine

## 2015-12-25 DIAGNOSIS — E119 Type 2 diabetes mellitus without complications: Secondary | ICD-10-CM | POA: Insufficient documentation

## 2015-12-25 DIAGNOSIS — Z792 Long term (current) use of antibiotics: Secondary | ICD-10-CM | POA: Insufficient documentation

## 2015-12-25 DIAGNOSIS — J45909 Unspecified asthma, uncomplicated: Secondary | ICD-10-CM | POA: Insufficient documentation

## 2015-12-25 DIAGNOSIS — R0789 Other chest pain: Secondary | ICD-10-CM | POA: Insufficient documentation

## 2015-12-25 DIAGNOSIS — Z7984 Long term (current) use of oral hypoglycemic drugs: Secondary | ICD-10-CM | POA: Insufficient documentation

## 2015-12-25 DIAGNOSIS — Z79899 Other long term (current) drug therapy: Secondary | ICD-10-CM | POA: Insufficient documentation

## 2015-12-25 DIAGNOSIS — Z8659 Personal history of other mental and behavioral disorders: Secondary | ICD-10-CM | POA: Insufficient documentation

## 2015-12-25 DIAGNOSIS — I10 Essential (primary) hypertension: Secondary | ICD-10-CM | POA: Insufficient documentation

## 2015-12-25 DIAGNOSIS — Z87891 Personal history of nicotine dependence: Secondary | ICD-10-CM | POA: Insufficient documentation

## 2015-12-25 NOTE — ED Notes (Signed)
Pt is complaining of mid-sternal chest pain that radiates to her back. Pain started last night while working [employer]). Pt reports feeling palpitations when taking a deep breath.

## 2015-12-25 NOTE — ED Provider Notes (Addendum)
CSN: [id number]     Arrival date & time 12/25/15  2312 History   First MD Initiated Contact with Patient 12/25/15 2334     Chief Complaint  Patient presents with  . Chest Pain     (Consider location/radiation/quality/duration/timing/severity/associated sxs/prior Treatment) HPI Patient presents to the emergency department with runny 4 hours worth of constant chest pain that started last night while she was at work.  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  Patient states that nothing seems make her condition better.  She did not take any medications prior to arrival.  Patient denies shortness of breath, weakness, dizziness, headache, blurred vision, rash, fever, cough, dysuria, incontinence, bloody stool, hematemesis, back pain, neck pain, edema, near syncope or syncope. Past Medical History  Diagnosis Date  . Hypertension   . Anxiety   . Asthma   . Seasonal allergies   . 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 Diabetes mellitus without complication ([place])     type 2   Past Surgical History  Procedure Laterality Date  . Tubal ligation  2009   Family History  Problem Relation Age of Onset  . Diabetes Mother   . Hypertension Mother   . Dementia Mother   . Diabetes Father   . Hypertension Father    Social History  Substance Use Topics  . Smoking status: Former Smoker    Quit date: 01/25/2007  . Smokeless tobacco: Never Used  . Alcohol Use: No   OB History    No data available     Review of Systems All other systems negative except as documented in the HPI. All pertinent positives and negatives as reviewed in the HPI.   Allergies  Lobster  Home Medications   Prior to Admission medications   Medication Sig Start Date End Date Taking? Authorizing Provider  amoxicillin (AMOXIL) 500 MG capsule Take 1 capsule (500 mg total) by mouth 3 (three) times daily. 12/14/15  Yes Arnoldo Morale, MD   Blood Glucose Monitoring Suppl (TRUE METRIX METER) DEVI 1 each by Does not apply route 3 (three) times daily before meals. 11/01/15  Yes Arnoldo Morale, MD  glipiZIDE (GLUCOTROL) 5 MG tablet Take 1 tablet (5 mg total) by mouth 2 (two) times daily before a meal. 12/14/15  Yes Arnoldo Morale, MD  glucose blood (TRUE METRIX BLOOD GLUCOSE TEST) test strip Use as directed 3 times daily before meals 12/14/15  Yes Arnoldo Morale, MD  ibuprofen (ADVIL,MOTRIN) 800 MG tablet Take 1 tablet (800 mg total) by mouth every 8 (eight) hours as needed. 12/14/15  Yes Arnoldo Morale, MD  lisinopril (PRINIVIL,ZESTRIL) 5 MG tablet Take 1 tablet (5 mg total) by mouth daily. 12/14/15  Yes Arnoldo Morale, MD  metFORMIN (GLUCOPHAGE) 500 MG tablet 1 tablet (500 mg) by mouth twice daily for one week then 2 tabs (1000 mg) twice daily Patient taking differently: Take 1,000 mg by mouth 2 (two) times daily.  12/14/15  Yes Arnoldo Morale, MD  TRUEPLUS LANCETS 28G MISC 1 each by Does not apply route 3 (three) times daily before meals. 11/01/15  Yes Arnoldo Morale, MD   BP 125/72 mmHg  Pulse 84  Temp(Src) 98.1 F (36.7 C) (Oral)  Resp 18  SpO2 98%  LMP 12/12/2015 Physical Exam  Constitutional: She is oriented to person, place, and time. She appears well-developed and well-nourished. No distress.  HENT:  Head: Normocephalic and atraumatic.  Mouth/Throat: Oropharynx is clear and moist.  Eyes: Pupils are equal, round, and reactive to light.  Neck: Normal range of motion. Neck supple.  Cardiovascular: Normal rate, regular rhythm and normal heart sounds.  Exam reveals no gallop and no friction rub.   No murmur heard. Pulmonary/Chest: Effort normal and breath sounds normal. No respiratory distress. She has no wheezes.  Abdominal: Soft. Bowel sounds are normal. She exhibits no distension. There is no tenderness.  Neurological: She is alert and oriented to person, place, and time. She exhibits normal muscle tone. Coordination normal.  Skin: Skin is  warm and dry. No rash noted. No erythema.  Psychiatric: She has a normal mood and affect. Her behavior is normal.  Nursing note and vitals reviewed.   ED Course  Procedures (including critical care time) Labs Review Labs Reviewed  CBC - Abnormal; Notable for the following:    HCT 34.6 (*)    All other components within normal limits  BASIC METABOLIC PANEL  I-STAT TROPOININ, ED    Imaging Review No results found. I have personally reviewed and evaluated these images and lab results as part of my medical decision-making.   EKG Interpretation   Date/Time:  Sunday December 25 2015 23:19:45 EST Ventricular Rate:  77 PR Interval:  159 QRS Duration: 97 QT Interval:  378 QTC Calculation: 428 R Axis:   81 Text Interpretation:  Sinus rhythm Confirmed by [facility]  MD, APRIL  ([id number]) on 12/26/2015 1:30:48 AM     Patient does have diabetes, hypertension, but these are well controlled.  She is not a smoker.  She states that she has never had any cardiac issues in the past.  Patient's chest pain has been constant for 24 hours and that is worse with movement and palpation.  Patient is PERC negative and low risk based on well's criteria.    Dalia Heading, PA-C 12/26/15 0126  Veatrice Kells, MD 12/26/15 0130  Dalia Heading, PA-C 12/26/15 0131  Veatrice Kells, MD 12/26/15 [phone]

## 2015-12-26 LAB — CBC
HCT: 34.6 % — ABNORMAL LOW (ref 36.0–46.0)
Hemoglobin: 12 g/dL (ref 12.0–15.0)
MCH: 29.4 pg (ref 26.0–34.0)
MCHC: 34.7 g/dL (ref 30.0–36.0)
MCV: 84.8 fL (ref 78.0–100.0)
PLATELETS: 286 10*3/uL (ref 150–400)
RBC: 4.08 MIL/uL (ref 3.87–5.11)
RDW: 12.7 % (ref 11.5–15.5)
WBC: 5.8 10*3/uL (ref 4.0–10.5)

## 2015-12-26 LAB — BASIC METABOLIC PANEL
Anion gap: 8 (ref 5–15)
BUN: 12 mg/dL (ref 6–20)
CALCIUM: 9 mg/dL (ref 8.9–10.3)
CO2: 21 mmol/L — AB (ref 22–32)
CREATININE: 0.58 mg/dL (ref 0.44–1.00)
Chloride: 105 mmol/L (ref 101–111)
Glucose, Bld: 324 mg/dL — ABNORMAL HIGH (ref 65–99)
Potassium: 3.8 mmol/L (ref 3.5–5.1)
SODIUM: 134 mmol/L — AB (ref 135–145)

## 2015-12-26 LAB — I-STAT TROPONIN, ED: TROPONIN I, POC: 0.01 ng/mL (ref 0.00–0.08)

## 2015-12-26 MED ORDER — IBUPROFEN 800 MG PO TABS
800.0000 mg | ORAL_TABLET | Freq: Once | ORAL | Status: AC
  Administered 2015-12-26: 800 mg via ORAL
  Filled 2015-12-26: qty 1

## 2015-12-26 MED ORDER — IBUPROFEN 800 MG PO TABS: 800.0000 mg | ORAL_TABLET | Freq: Three times a day (TID) | ORAL | Status: DC | PRN

## 2015-12-26 MED ORDER — HYDROCODONE-ACETAMINOPHEN 5-325 MG PO TABS: 1.0000 | ORAL_TABLET | Freq: Four times a day (QID) | ORAL | Status: DC | PRN

## 2015-12-26 NOTE — Discharge Instructions (Signed)
Return here as needed.  Your testing here tonight did not show any significant abnormality.  Follow-up with your primary care doctor

## 2016-01-16 ENCOUNTER — Ambulatory Visit: Payer: Self-pay | Admitting: Family Medicine

## 2016-01-25 ENCOUNTER — Ambulatory Visit: Payer: Self-pay | Admitting: Family Medicine

## 2016-02-01 ENCOUNTER — Encounter (HOSPITAL_COMMUNITY): Payer: Self-pay | Admitting: Emergency Medicine

## 2016-02-01 ENCOUNTER — Emergency Department (HOSPITAL_COMMUNITY)
Admission: EM | Admit: 2016-02-01 | Discharge: 2016-02-02 | Disposition: A | Discharge status: Home / Self Care | Payer: Self-pay | Attending: Emergency Medicine | Admitting: Emergency Medicine

## 2016-02-01 DIAGNOSIS — J45909 Unspecified asthma, uncomplicated: Secondary | ICD-10-CM | POA: Insufficient documentation

## 2016-02-01 DIAGNOSIS — I10 Essential (primary) hypertension: Secondary | ICD-10-CM | POA: Insufficient documentation

## 2016-02-01 DIAGNOSIS — E119 Type 2 diabetes mellitus without complications: Secondary | ICD-10-CM | POA: Insufficient documentation

## 2016-02-01 DIAGNOSIS — R079 Chest pain, unspecified: Secondary | ICD-10-CM | POA: Insufficient documentation

## 2016-02-01 NOTE — ED Notes (Signed)
Patient here with complaints of centralized chest pain radiating up into neck down left arm that started last Saturday. Nausea no vomiting. Hx diabetes.

## 2016-02-02 NOTE — ED Notes (Signed)
Called for second time  No response from lobby 

## 2016-02-02 NOTE — ED Notes (Signed)
Called to take to room  No response from lobby  

## 2016-02-19 ENCOUNTER — Encounter (HOSPITAL_COMMUNITY): Payer: Self-pay | Admitting: Emergency Medicine

## 2016-02-19 ENCOUNTER — Emergency Department (INDEPENDENT_AMBULATORY_CARE_PROVIDER_SITE_OTHER)
Admission: EM | Admit: 2016-02-19 | Discharge: 2016-02-19 | Disposition: A | Discharge status: Home / Self Care | Payer: Self-pay | Source: Home / Self Care | Attending: Emergency Medicine | Admitting: Emergency Medicine

## 2016-02-19 DIAGNOSIS — K029 Dental caries, unspecified: Secondary | ICD-10-CM

## 2016-02-19 DIAGNOSIS — K047 Periapical abscess without sinus: Secondary | ICD-10-CM

## 2016-02-19 MED ORDER — PENICILLIN V POTASSIUM 500 MG PO TABS: 500.0000 mg | ORAL_TABLET | Freq: Four times a day (QID) | ORAL | Status: AC

## 2016-02-19 MED ORDER — HYDROCODONE-ACETAMINOPHEN 5-325 MG PO TABS: 1.0000 | ORAL_TABLET | Freq: Four times a day (QID) | ORAL | Status: DC | PRN

## 2016-02-19 MED ORDER — IBUPROFEN 600 MG PO TABS: 600.0000 mg | ORAL_TABLET | Freq: Four times a day (QID) | ORAL | Status: DC | PRN

## 2016-02-19 NOTE — ED Provider Notes (Signed)
CSN: [id number]     Arrival date & time 02/19/16  1303 History   First MD Initiated Contact with Patient 02/19/16 1336     Chief Complaint  Patient presents with  . Dental Injury  . Dental Pain   (Consider location/radiation/quality/duration/timing/severity/associated sxs/prior Treatment) HPI  She is a 40 year old woman here for evaluation of dental injury. She states about 3 days ago to have her left upper molars chipped off. She denies any trauma. Since then they have been quite painful. She's been taking ibuprofen without improvement. She has used Orajel which does help some. Over the last day or so she has noticed some swelling in her left cheek. No fevers. No drainage.  Past Medical History  Diagnosis Date  . Hypertension   . Anxiety   . Asthma   . Seasonal allergies   . 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 Diabetes mellitus without complication ([place])     type 2   Past Surgical History  Procedure Laterality Date  . Tubal ligation  2009   Family History  Problem Relation Age of Onset  . Diabetes Mother   . Hypertension Mother   . Dementia Mother   . Diabetes Father   . Hypertension Father    Social History  Substance Use Topics  . Smoking status: Former Smoker    Quit date: 01/25/2007  . Smokeless tobacco: Never Used  . Alcohol Use: No   OB History    No data available     Review of Systems As in history of present illness Allergies  Lobster  Home Medications   Prior to Admission medications   Medication Sig Start Date End Date Taking? Authorizing Provider  Blood Glucose Monitoring Suppl (TRUE METRIX METER) DEVI 1 each by Does not apply route 3 (three) times daily before meals. 11/01/15  Yes Arnoldo Morale, MD  glipiZIDE (GLUCOTROL) 5 MG tablet Take 1 tablet (5 mg total) by mouth 2 (two) times daily before a meal. 12/14/15  Yes Arnoldo Morale, MD  glucose blood (TRUE METRIX BLOOD GLUCOSE TEST) test strip Use as directed 3 times daily before meals 12/14/15  Yes Arnoldo Morale, MD    lisinopril (PRINIVIL,ZESTRIL) 5 MG tablet Take 1 tablet (5 mg total) by mouth daily. 12/14/15  Yes Arnoldo Morale, MD  metFORMIN (GLUCOPHAGE) 500 MG tablet 1 tablet (500 mg) by mouth twice daily for one week then 2 tabs (1000 mg) twice daily Patient taking differently: Take 1,000 mg by mouth 2 (two) times daily.  12/14/15  Yes Arnoldo Morale, MD  TRUEPLUS LANCETS 28G MISC 1 each by Does not apply route 3 (three) times daily before meals. 11/01/15  Yes Arnoldo Morale, MD  amoxicillin (AMOXIL) 500 MG capsule Take 1 capsule (500 mg total) by mouth 3 (three) times daily. 12/14/15   Arnoldo Morale, MD  HYDROcodone-acetaminophen (NORCO) 5-325 MG tablet Take 1 tablet by mouth every 6 (six) hours as needed for moderate pain. 02/19/16   Melony Overly, MD  ibuprofen (ADVIL,MOTRIN) 600 MG tablet Take 1 tablet (600 mg total) by mouth every 6 (six) hours as needed for moderate pain. 02/19/16   Melony Overly, MD  penicillin v potassium (VEETID) 500 MG tablet Take 1 tablet (500 mg total) by mouth 4 (four) times daily. 02/19/16 02/26/16  Melony Overly, MD   Meds Ordered and Administered this Visit  Medications - No data to display  BP 124/87 mmHg  Pulse 79  Temp(Src) 98.4 F (36.9 C) (Oral)  SpO2 97%  LMP  02/08/2016 (Exact Date) No data found.   Physical Exam  Constitutional: She is oriented to person, place, and time. She appears well-developed and well-nourished. No distress.  HENT:  Mouth/Throat:    Cardiovascular: Normal rate.   Neurological: She is alert and oriented to person, place, and time.    ED Course  Procedures (including critical care time)  Labs Review Labs Reviewed - No data to display  Imaging Review No results found.    MDM   1. Dental caries   2. Dental infection    We'll treat with penicillin, ibuprofen, and hydrocodone. Handout given on dental resources in the community.    Melony Overly, MD 02/19/16 [phone]

## 2016-02-19 NOTE — Discharge Instructions (Signed)
The broken off tooth has become infected. Continue Orajel as needed for pain relief. Take penicillin 4 times a day for the next 10 days. Use ibuprofen every 6-8 hours for pain. Use the hydrocodone every 4-6 hours as needed for severe pain. The important thing is to follow-up with the dentist as these teeth will likely need to be pulled.  [facility] [employer] [address] is a great source of information about community services available.  Access by dialing 2-1-1 from anywhere in [place], or by website -  Cust[employer]ianSupply.fi.   Other Local Resources (Updated 11/2015)  Dental  Care   Services    Phone Number and Address  Cost  [facility] For children 20 - 71 years of age:   Cleaning  Tooth brushing/flossing instruction  Sealants, fillings, crowns  Extractions  Emergency treatment  [phone] [address], [place] [address] Charges based on family income.  Medicaid and some insurance plans accepted.     [facility] - [facility], fillings, crowns  Extractions  Emergency treatment [phone] [address], [place]  Pregnant women 24 years of age or older with a Medicaid card  [facility] - [place]  Cleaning  Sealants, fillings, crowns  Extractions  Emergency treatment [phone] [address] [place], [place] Pregnant women 19 years of age or older with a Medicaid card  [facility] For children 90 - 31 years of age:   Cleaning  Tooth brushing/flossing instruction  Sealants, fillings, crowns  Extractions  Emergency treatment Limited orthodontic services for patients with Medicaid [phone] [address], [place] [address] Medicaid and [facility] Health Choice cover for children up to age 73 and pregnant women.  Parents of  children up to age 64 without Medicaid pay a reduced fee at time of service.  [place] For children 69 - 68 years of age:   Cleaning  Tooth brushing/flossing instruction  Sealants, fillings, crowns  Extractions  Emergency treatment Limited orthodontic services for patients with Medicaid [phone] [place], [place].  Medicaid and [place] Health Choice cover for children up to age 62 and pregnant women.  Parents of children up to age 76 without Medicaid pay a reduced fee.  [facility]  Sealants, fillings, crowns  Extractions  Hours: Tuesdays and Thursdays, 4:15 - 8 pm [phone] [address], [place], [place] [address] Services free of charge to [facility] residents ages 18-64 who do not have health insurance, Medicare, [place], or [place] benefits and fall within federal poverty guidelines  [place] care in addition to primary medical care, nutritional counseling, and pharmacy:  Engineer, drilling, fillings, crowns  Extractions                  [phone] [facility], [place], [place], [place], [place], [facility], [place], [place] [facility] [place], [place], [place], most insurance.  Also provides services available to all with fees adjusted based on ability to pay.  [facility]  Cleaning  Tooth brushing/flossing instruction  Sealants, fillings, crowns  Extractions  Emergency treatment Hours: Tuesdays, Thursdays, and Fridays from 8 am to 5 pm by appointment  only. [phone] [address] [place], [place] [address] [facility] residents with Medicaid (depending on eligibility) and children with [facility] Health Choice - call for more information.  [facility]  Extractions only  Hours: 2nd and 4th Thursday of each month from 6:30 am - 9 am.   [phone] ext. [address], [place] [address] Ages 15 and older only.  Patients are seen on a first come, first served basis.  [employer]  [employer]  Extractions  Orthodontics  Endodontics  Implants/Crowns/Bridges  Complete and partial dentures [phone] [place],  Patients must complete an application for services.  There is often a waiting list.

## 2016-02-19 NOTE — ED Notes (Signed)
The patient presented to the [facility] with a complaint of dental pain secondary to 2 chipped teeth on the left side of her mouth that occurred 3 days ago.

## 2016-04-14 ENCOUNTER — Emergency Department (HOSPITAL_COMMUNITY)
Admission: EM | Admit: 2016-04-14 | Discharge: 2016-04-14 | Disposition: A | Discharge status: Home / Self Care | Payer: Self-pay | Attending: Emergency Medicine | Admitting: Emergency Medicine

## 2016-04-14 ENCOUNTER — Encounter (HOSPITAL_COMMUNITY): Payer: Self-pay | Admitting: Emergency Medicine

## 2016-04-14 DIAGNOSIS — E119 Type 2 diabetes mellitus without complications: Secondary | ICD-10-CM | POA: Insufficient documentation

## 2016-04-14 DIAGNOSIS — Z7722 Contact with and (suspected) exposure to environmental tobacco smoke (acute) (chronic): Secondary | ICD-10-CM | POA: Insufficient documentation

## 2016-04-14 DIAGNOSIS — Z79899 Other long term (current) drug therapy: Secondary | ICD-10-CM | POA: Insufficient documentation

## 2016-04-14 DIAGNOSIS — Z7984 Long term (current) use of oral hypoglycemic drugs: Secondary | ICD-10-CM | POA: Insufficient documentation

## 2016-04-14 DIAGNOSIS — J45909 Unspecified asthma, uncomplicated: Secondary | ICD-10-CM | POA: Insufficient documentation

## 2016-04-14 DIAGNOSIS — R110 Nausea: Secondary | ICD-10-CM | POA: Insufficient documentation

## 2016-04-14 DIAGNOSIS — M542 Cervicalgia: Secondary | ICD-10-CM | POA: Insufficient documentation

## 2016-04-14 DIAGNOSIS — B379 Candidiasis, unspecified: Secondary | ICD-10-CM

## 2016-04-14 DIAGNOSIS — B373 Candidiasis of vulva and vagina: Secondary | ICD-10-CM | POA: Insufficient documentation

## 2016-04-14 DIAGNOSIS — I10 Essential (primary) hypertension: Secondary | ICD-10-CM | POA: Insufficient documentation

## 2016-04-14 LAB — WET PREP, GENITAL
Clue Cells Wet Prep HPF POC: NONE SEEN
Sperm: NONE SEEN
Trich, Wet Prep: NONE SEEN
Yeast Wet Prep HPF POC: NONE SEEN

## 2016-04-14 LAB — URINALYSIS, ROUTINE W REFLEX MICROSCOPIC
Bilirubin Urine: NEGATIVE
Glucose, UA: >1000 mg/dL — AB
Hgb urine dipstick: NEGATIVE
Ketones, ur: NEGATIVE mg/dL
Nitrite: NEGATIVE
Protein, ur: NEGATIVE mg/dL
Specific Gravity, Urine: 1.039 — ABNORMAL HIGH (ref 1.005–1.030)
pH: 5.5 (ref 5.0–8.0)

## 2016-04-14 LAB — URINE MICROSCOPIC-ADD ON: RBC / HPF: NONE SEEN RBC/hpf (ref 0–5)

## 2016-04-14 LAB — CBG MONITORING, ED: Glucose-Capillary: 243 mg/dL — ABNORMAL HIGH (ref 65–99)

## 2016-04-14 LAB — PREGNANCY, URINE: Preg Test, Ur: NEGATIVE

## 2016-04-14 MED ORDER — ONDANSETRON 4 MG PO TBDP
4.0000 mg | ORAL_TABLET | Freq: Once | ORAL | Status: AC
  Administered 2016-04-14: 4 mg via ORAL
  Filled 2016-04-14: qty 1

## 2016-04-14 MED ORDER — FLUCONAZOLE 150 MG PO TABS: 150.0000 mg | ORAL_TABLET | Freq: Once | ORAL | Status: AC

## 2016-04-14 MED ORDER — FLUCONAZOLE 150 MG PO TABS
150.0000 mg | ORAL_TABLET | Freq: Once | ORAL | Status: AC
  Administered 2016-04-14: 150 mg via ORAL
  Filled 2016-04-14: qty 1

## 2016-04-14 MED ORDER — ONDANSETRON HCL 4 MG PO TABS: 4.0000 mg | ORAL_TABLET | Freq: Four times a day (QID) | ORAL | Status: DC

## 2016-04-14 NOTE — ED Notes (Signed)
Pt reports groin itching for the past 3 weeks. Denies any vaginal symptoms or itching. Also has bump behind ear and neck pain. No known injury.

## 2016-04-14 NOTE — Discharge Instructions (Signed)
Nausea and Vomiting Nausea is a sick feeling that often comes before throwing up (vomiting). Vomiting is a reflex where stomach contents come out of your mouth. Vomiting can cause severe loss of body fluids (dehydration). Children and elderly adults can become dehydrated quickly, especially if they also have diarrhea. Nausea and vomiting are symptoms of a condition or disease. It is important to find the cause of your symptoms. CAUSES   Direct irritation of the stomach lining. This irritation can result from increased acid production (gastroesophageal reflux disease), infection, food poisoning, taking certain medicines (such as nonsteroidal anti-inflammatory drugs), alcohol use, or tobacco use.  Signals from the brain.These signals could be caused by a headache, heat exposure, an inner ear disturbance, increased pressure in the brain from injury, infection, a tumor, or a concussion, pain, emotional stimulus, or metabolic problems.  An obstruction in the gastrointestinal tract (bowel obstruction).  Illnesses such as diabetes, hepatitis, gallbladder problems, appendicitis, kidney problems, cancer, sepsis, atypical symptoms of a heart attack, or eating disorders.  Medical treatments such as chemotherapy and radiation.  Receiving medicine that makes you sleep (general anesthetic) during surgery. DIAGNOSIS Your caregiver may ask for tests to be done if the problems do not improve after a few days. Tests may also be done if symptoms are severe or if the reason for the nausea and vomiting is not clear. Tests may include:  Urine tests.  Blood tests.  Stool tests.  Cultures (to look for evidence of infection).  X-rays or other imaging studies. Test results can help your caregiver make decisions about treatment or the need for additional tests. TREATMENT You need to stay well hydrated. Drink frequently but in small amounts.You may wish to drink water, sports drinks, clear broth, or eat frozen  ice pops or gelatin dessert to help stay hydrated.When you eat, eating slowly may help prevent nausea.There are also some antinausea medicines that may help prevent nausea. HOME CARE INSTRUCTIONS   Take all medicine as directed by your caregiver.  If you do not have an appetite, do not force yourself to eat. However, you must continue to drink fluids.  If you have an appetite, eat a normal diet unless your caregiver tells you differently.  Eat a variety of complex carbohydrates (rice, wheat, potatoes, bread), lean meats, yogurt, fruits, and vegetables.  Avoid high-fat foods because they are more difficult to digest.  Drink enough water and fluids to keep your urine clear or pale yellow.  If you are dehydrated, ask your caregiver for specific rehydration instructions. Signs of dehydration may include:  Severe thirst.  Dry lips and mouth.  Dizziness.  Dark urine.  Decreasing urine frequency and amount.  Confusion.  Rapid breathing or pulse. SEEK IMMEDIATE MEDICAL CARE IF:   You have blood or brown flecks (like coffee grounds) in your vomit.  You have black or bloody stools.  You have a severe headache or stiff neck.  You are confused.  You have severe abdominal pain.  You have chest pain or trouble breathing.  You do not urinate at least once every 8 hours.  You develop cold or clammy skin.  You continue to vomit for longer than 24 to 48 hours.  You have a fever. MAKE SURE YOU:   Understand these instructions.  Will watch your condition.  Will get help right away if you are not doing well or get worse.   This information is not intended to replace advice given to you by your health care provider. Make sure  you discuss any questions you have with your health care provider.   Document Released: 11/12/2005 Document Revised: 02/04/2012 Document Reviewed: 04/11/2011 Elsevier Interactive Patient Education [employer]. Please use second dose of  fluconazole in 72 hours if symptoms have not improved. Please use Zofran as needed for nausea, follow up with her primary care provider for reevaluation turn the emergency room immediately if any concerning signs or symptoms present.

## 2016-04-14 NOTE — ED Provider Notes (Signed)
CSN: [id number]     Arrival date & time 04/14/16  1235 History   First MD Initiated Contact with Patient 04/14/16 1418     Chief Complaint  Patient presents with  . groin itching   . Neck Pain    HPI   40 year old female presents today with numerous complaints. Patient reports that she's had vaginal itching and very light rash to the labia for the last few weeks. Patient reports intermittent left lower abdominal discomfort around the same time. Patient notes that she tried Monistat at home with no improvement in her symptoms. Patient denies any significant vaginal discharge, odor, does note some discomfort with intercourse. She denies any fever, chills. Patient reports intermittent nausea and several episodes of vomiting over the last several days. Patient notes that she's had a bowel movement this morning that was diarrhea. He reports she is able to eat and drink without difficulty. Patient denies any urinary complaints.  Patient also notes that she has a small abrasion to her right mastoid.  Patient notes that her tubes are tied, she is sexually active with monogamous partner.  Itching rash around vagina 2 week, left lower ab discomfort-  Tried monostat- sexually active   Past Medical History  Diagnosis Date  . Hypertension   . Anxiety   . Asthma   . Seasonal allergies   . 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 Diabetes mellitus without complication ([place])     type 2   Past Surgical History  Procedure Laterality Date  . Tubal ligation  2009   Family History  Problem Relation Age of Onset  . Diabetes Mother   . Hypertension Mother   . Dementia Mother   . Diabetes Father   . Hypertension Father    Social History  Substance Use Topics  . Smoking status: Former Smoker    Quit date: 01/25/2007  . Smokeless tobacco: Never Used  . Alcohol Use: No   OB History    No data available     Review of Systems  All other systems reviewed and are negative.   Allergies  Lobster  Home  Medications   Prior to Admission medications   Medication Sig Start Date End Date Taking? Authorizing Provider  Chlorpheniramine Maleate (ALLERGY PO) Take 1 tablet by mouth daily as needed (allergies.).   Yes Historical Provider, MD  glipiZIDE (GLUCOTROL) 5 MG tablet Take 1 tablet (5 mg total) by mouth 2 (two) times daily before a meal. 12/14/15  Yes Arnoldo Morale, MD  ibuprofen (ADVIL,MOTRIN) 600 MG tablet Take 1 tablet (600 mg total) by mouth every 6 (six) hours as needed for moderate pain. 02/19/16  Yes Melony Overly, MD  lisinopril (PRINIVIL,ZESTRIL) 5 MG tablet Take 1 tablet (5 mg total) by mouth daily. 12/14/15  Yes Arnoldo Morale, MD  metFORMIN (GLUCOPHAGE) 500 MG tablet 1 tablet (500 mg) by mouth twice daily for one week then 2 tabs (1000 mg) twice daily Patient taking differently: Take 1,000 mg by mouth 2 (two) times daily.  12/14/15  Yes Arnoldo Morale, MD  amoxicillin (AMOXIL) 500 MG capsule Take 1 capsule (500 mg total) by mouth 3 (three) times daily. Patient not taking: Reported on 04/14/2016 12/14/15   Arnoldo Morale, MD  Blood Glucose Monitoring Suppl (TRUE METRIX METER) DEVI 1 each by Does not apply route 3 (three) times daily before meals. 11/01/15   Arnoldo Morale, MD  fluconazole (DIFLUCAN) 150 MG tablet Take 1 tablet (150 mg total) by mouth once. 04/14/16 04/21/16  Dellis Filbert  Kamarrion Stfort, PA-C  glucose blood (TRUE METRIX BLOOD GLUCOSE TEST) test strip Use as directed 3 times daily before meals 12/14/15   Arnoldo Morale, MD  HYDROcodone-acetaminophen (NORCO) 5-325 MG tablet Take 1 tablet by mouth every 6 (six) hours as needed for moderate pain. Patient not taking: Reported on 04/14/2016 02/19/16   Melony Overly, MD  ondansetron (ZOFRAN) 4 MG tablet Take 1 tablet (4 mg total) by mouth every 6 (six) hours. 04/14/16   Okey Regal, PA-C  TRUEPLUS LANCETS 28G MISC 1 each by Does not apply route 3 (three) times daily before meals. 11/01/15   Arnoldo Morale, MD   BP 120/85 mmHg  Pulse 76  Temp(Src) 98.9 F (37.2 C)  (Oral)  Resp 16  SpO2 97%   Physical Exam  Constitutional: She is oriented to person, place, and time. She appears well-developed and well-nourished.  HENT:  Head: Normocephalic and atraumatic.  Eyes: Conjunctivae are normal. Pupils are equal, round, and reactive to light. Right eye exhibits no discharge. Left eye exhibits no discharge. No scleral icterus.  Neck: Normal range of motion. No JVD present. No tracheal deviation present.  Pulmonary/Chest: Effort normal. No stridor.  Abdominal:  Minor TTP of left lower quadrant, non surgical, no rebound or guarding,   Genitourinary: Uterus normal. There is rash on the right labia. There is rash on the left labia. Cervix exhibits no motion tenderness and no discharge.  Neurological: She is alert and oriented to person, place, and time. Coordination normal.  Psychiatric: She has a normal mood and affect. Her behavior is normal. Judgment and thought content normal.  Nursing note and vitals reviewed.   ED Course  Procedures (including critical care time) Labs Review Labs Reviewed  WET PREP, GENITAL - Abnormal; Notable for the following:    WBC, Wet Prep HPF POC RARE (*)    All other components within normal limits  URINALYSIS, ROUTINE W REFLEX MICROSCOPIC (NOT AT [facility]) - Abnormal; Notable for the following:    Color, Urine AMBER (*)    APPearance CLOUDY (*)    Specific Gravity, Urine 1.039 (*)    Glucose, UA >1000 (*)    Leukocytes, UA SMALL (*)    All other components within normal limits  URINE MICROSCOPIC-ADD ON - Abnormal; Notable for the following:    Squamous Epithelial / LPF TOO NUMEROUS TO COUNT (*)    Bacteria, UA MANY (*)    All other components within normal limits  CBG MONITORING, ED - Abnormal; Notable for the following:    Glucose-Capillary 243 (*)    All other components within normal limits  PREGNANCY, URINE  GC/CHLAMYDIA PROBE AMP ([place]) NOT AT [facility]    Imaging Review No results found. I have personally  reviewed and evaluated these images and lab results as part of my medical decision-making.   EKG Interpretation None      MDM   Final diagnoses:  Yeast infection  Nausea    Labs: 20 care CBG, wet prep, urinalysis, urine pregnancy, urinalysis- [place] present on urinalysis  Imaging:  Consults:  Therapeutics: Fluconazole  Discharge Meds: Fluconazole, zofran  Assessment/Plan: 40 year old female presents today with likely yeast infection. Patient also has what appears to be superficial impetigo to the right posterior mastoid, no significant surrounding cellulitis or tenderness to palpation. Patient is afebrile, nontoxic in no acute distress. Patient has intermittent abdominal discomfort, no significant abdominal tenderness that would require further evaluation or management here in the ED. Patient discharged home with antinausea medication, fluconazole for  repeat administration if symptoms do not improve in 48 hours. Patient is instructed follow-up with primary care for reevaluation, return to the ED if any new or worsening signs or symptoms present. She verbalized understanding and agreement today's plan.        Okey Regal, PA-C 04/14/16 2115  Forde Dandy, MD 04/16/16 [phone]

## 2016-04-16 LAB — GC/CHLAMYDIA PROBE AMP (~~LOC~~) NOT AT ARMC
Chlamydia: NEGATIVE
Neisseria Gonorrhea: NEGATIVE

## 2016-06-30 ENCOUNTER — Emergency Department (HOSPITAL_COMMUNITY)
Admission: EM | Admit: 2016-06-30 | Discharge: 2016-06-30 | Disposition: A | Discharge status: Home / Self Care | Payer: PRIVATE HEALTH INSURANCE | Attending: Emergency Medicine | Admitting: Emergency Medicine

## 2016-06-30 ENCOUNTER — Encounter (HOSPITAL_COMMUNITY): Payer: Self-pay | Admitting: Emergency Medicine

## 2016-06-30 DIAGNOSIS — R739 Hyperglycemia, unspecified: Secondary | ICD-10-CM

## 2016-06-30 DIAGNOSIS — Z79899 Other long term (current) drug therapy: Secondary | ICD-10-CM | POA: Insufficient documentation

## 2016-06-30 DIAGNOSIS — E1165 Type 2 diabetes mellitus with hyperglycemia: Secondary | ICD-10-CM | POA: Insufficient documentation

## 2016-06-30 DIAGNOSIS — Z87891 Personal history of nicotine dependence: Secondary | ICD-10-CM | POA: Insufficient documentation

## 2016-06-30 DIAGNOSIS — Z791 Long term (current) use of non-steroidal anti-inflammatories (NSAID): Secondary | ICD-10-CM | POA: Insufficient documentation

## 2016-06-30 DIAGNOSIS — R112 Nausea with vomiting, unspecified: Secondary | ICD-10-CM | POA: Insufficient documentation

## 2016-06-30 DIAGNOSIS — Z7984 Long term (current) use of oral hypoglycemic drugs: Secondary | ICD-10-CM | POA: Insufficient documentation

## 2016-06-30 DIAGNOSIS — R197 Diarrhea, unspecified: Secondary | ICD-10-CM | POA: Insufficient documentation

## 2016-06-30 DIAGNOSIS — J45909 Unspecified asthma, uncomplicated: Secondary | ICD-10-CM | POA: Insufficient documentation

## 2016-06-30 DIAGNOSIS — I10 Essential (primary) hypertension: Secondary | ICD-10-CM | POA: Insufficient documentation

## 2016-06-30 LAB — COMPREHENSIVE METABOLIC PANEL
ALT: 36 U/L (ref 14–54)
AST: 30 U/L (ref 15–41)
Albumin: 3.9 g/dL (ref 3.5–5.0)
Alkaline Phosphatase: 158 U/L — ABNORMAL HIGH (ref 38–126)
Anion gap: 9 (ref 5–15)
BILIRUBIN TOTAL: 0.5 mg/dL (ref 0.3–1.2)
BUN: 9 mg/dL (ref 6–20)
CALCIUM: 8.9 mg/dL (ref 8.9–10.3)
CO2: 21 mmol/L — ABNORMAL LOW (ref 22–32)
CREATININE: 0.78 mg/dL (ref 0.44–1.00)
Chloride: 104 mmol/L (ref 101–111)
Glucose, Bld: 351 mg/dL — ABNORMAL HIGH (ref 65–99)
Potassium: 3.3 mmol/L — ABNORMAL LOW (ref 3.5–5.1)
Sodium: 134 mmol/L — ABNORMAL LOW (ref 135–145)
TOTAL PROTEIN: 7.1 g/dL (ref 6.5–8.1)

## 2016-06-30 LAB — URINE MICROSCOPIC-ADD ON: RBC / HPF: NONE SEEN RBC/hpf (ref 0–5)

## 2016-06-30 LAB — URINALYSIS, ROUTINE W REFLEX MICROSCOPIC
Bilirubin Urine: NEGATIVE
Hgb urine dipstick: NEGATIVE
KETONES UR: NEGATIVE mg/dL
LEUKOCYTES UA: NEGATIVE
NITRITE: NEGATIVE
PROTEIN: NEGATIVE mg/dL
Specific Gravity, Urine: 1.043 — ABNORMAL HIGH (ref 1.005–1.030)
pH: 5.5 (ref 5.0–8.0)

## 2016-06-30 LAB — CBC
HCT: 38.2 % (ref 36.0–46.0)
Hemoglobin: 13.4 g/dL (ref 12.0–15.0)
MCH: 29.5 pg (ref 26.0–34.0)
MCHC: 35.1 g/dL (ref 30.0–36.0)
MCV: 84.1 fL (ref 78.0–100.0)
PLATELETS: 296 10*3/uL (ref 150–400)
RBC: 4.54 MIL/uL (ref 3.87–5.11)
RDW: 12.6 % (ref 11.5–15.5)
WBC: 7.5 10*3/uL (ref 4.0–10.5)

## 2016-06-30 LAB — CBG MONITORING, ED: Glucose-Capillary: 293 mg/dL — ABNORMAL HIGH (ref 65–99)

## 2016-06-30 LAB — PREGNANCY, URINE: Preg Test, Ur: NEGATIVE

## 2016-06-30 LAB — LIPASE, BLOOD: Lipase: 31 U/L (ref 11–51)

## 2016-06-30 MED ORDER — SODIUM CHLORIDE 0.9 % IV BOLUS (SEPSIS)
1000.0000 mL | Freq: Once | INTRAVENOUS | Status: AC
  Administered 2016-06-30: 1000 mL via INTRAVENOUS

## 2016-06-30 MED ORDER — DIPHENOXYLATE-ATROPINE 2.5-0.025 MG PO TABS: 2.0000 | ORAL_TABLET | Freq: Four times a day (QID) | ORAL | 0 refills | Status: DC | PRN

## 2016-06-30 MED ORDER — PROMETHAZINE HCL 25 MG PO TABS: 25.0000 mg | ORAL_TABLET | Freq: Four times a day (QID) | ORAL | 0 refills | Status: DC | PRN

## 2016-06-30 MED ORDER — ONDANSETRON HCL 4 MG/2ML IJ SOLN
4.0000 mg | Freq: Once | INTRAMUSCULAR | Status: AC
  Administered 2016-06-30: 4 mg via INTRAVENOUS
  Filled 2016-06-30: qty 2

## 2016-06-30 MED ORDER — INSULIN ASPART 100 UNIT/ML ~~LOC~~ SOLN
10.0000 [IU] | Freq: Once | SUBCUTANEOUS | Status: AC
  Administered 2016-06-30: 10 [IU] via SUBCUTANEOUS
  Filled 2016-06-30: qty 1

## 2016-06-30 MED ORDER — DIPHENOXYLATE-ATROPINE 2.5-0.025 MG PO TABS
2.0000 | ORAL_TABLET | Freq: Once | ORAL | Status: AC
Start: 2016-06-30 — End: 2016-06-30
  Administered 2016-06-30: 2 via ORAL
  Filled 2016-06-30: qty 2

## 2016-06-30 NOTE — ED Triage Notes (Signed)
Pt from home with complaints of left sided abdominal pain since Thursday. Pt states she has n,v,and diarrhea. Pt states she has had 4 episodes of emesis and many episodes of diarrhea.

## 2016-06-30 NOTE — ED Provider Notes (Signed)
[facility] DEPT Provider Note   CSN: [id number] Arrival date & time: 06/30/16  0231  By signing my name below, I, Gwenlyn Fudge, attest that this documentation has been prepared under the direction and in the presence of Orpah Greek, MD. Electronically Signed: Gwenlyn Fudge, ED Scribe. 06/30/16. 3:06 AM.  First MD Initiated Contact with Patient 06/30/16 0302    History   Chief Complaint Chief Complaint  Patient presents with  . Emesis  . Abdominal Pain  . Diarrhea   The history is provided by the patient. No language interpreter was used.    HPI Comments: Wendy Olson is a 40 y.o. female with PMHx of DM and HTN who presents to the Emergency Department complaining of gradual onset, intermittent, left sided abdominal pain onset 2 days. She states she has not had these symptoms before. Pt reports associated nausea, vomiting, diarrhea. Pt states she has had 4 episodes of diarrhea and many episodes of vomiting. Pt denies sick contact. Pt denies pelvic pain.  Past Medical History:  Diagnosis Date  . 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 Anxiety   . Asthma   . Diabetes mellitus without complication ([place])    type 2  . Hypertension   . Seasonal allergies     Patient Active Problem List   Diagnosis Date Noted  . Hypertension 12/14/2015  . Type 2 diabetes mellitus with hyperglycemia, without long-term current use of insulin ([place]) 11/01/2015    Past Surgical History:  Procedure Laterality Date  . TUBAL LIGATION  2009    OB History    No data available       Home Medications    Prior to Admission medications   Medication Sig Start Date End Date Taking? Authorizing Provider  Chlorpheniramine Maleate (ALLERGY PO) Take 1 tablet by mouth daily as needed (allergies.).   Yes Historical Provider, MD  glipiZIDE (GLUCOTROL) 5 MG tablet Take 1 tablet (5 mg total) by mouth 2 (two) times daily before a meal. 12/14/15  Yes Arnoldo Morale, MD  lisinopril (PRINIVIL,ZESTRIL) 5 MG tablet Take  1 tablet (5 mg total) by mouth daily. 12/14/15  Yes Arnoldo Morale, MD  metFORMIN (GLUCOPHAGE) 500 MG tablet 1 tablet (500 mg) by mouth twice daily for one week then 2 tabs (1000 mg) twice daily Patient taking differently: Take 1,000 mg by mouth 2 (two) times daily.  12/14/15  Yes Arnoldo Morale, MD  Blood Glucose Monitoring Suppl (TRUE METRIX METER) DEVI 1 each by Does not apply route 3 (three) times daily before meals. Patient not taking: Reported on 06/30/2016 11/01/15   Arnoldo Morale, MD  glucose blood (TRUE METRIX BLOOD GLUCOSE TEST) test strip Use as directed 3 times daily before meals Patient not taking: Reported on 06/30/2016 12/14/15   Arnoldo Morale, MD  HYDROcodone-acetaminophen (NORCO) 5-325 MG tablet Take 1 tablet by mouth every 6 (six) hours as needed for moderate pain. Patient not taking: Reported on 04/14/2016 02/19/16   Melony Overly, MD  ibuprofen (ADVIL,MOTRIN) 600 MG tablet Take 1 tablet (600 mg total) by mouth every 6 (six) hours as needed for moderate pain. Patient not taking: Reported on 06/30/2016 02/19/16   Melony Overly, MD  ondansetron (ZOFRAN) 4 MG tablet Take 1 tablet (4 mg total) by mouth every 6 (six) hours. Patient not taking: Reported on 06/30/2016 04/14/16   Okey Regal, PA-C  TRUEPLUS LANCETS 28G MISC 1 each by Does not apply route 3 (three) times daily before meals. Patient not taking: Reported on 06/30/2016 11/01/15  Arnoldo Morale, MD    Family History Family History  Problem Relation Age of Onset  . Diabetes Mother   . Hypertension Mother   . Dementia Mother   . Diabetes Father   . Hypertension Father     Social History Social History  Substance Use Topics  . Smoking status: Former Smoker    Quit date: 01/25/2007  . Smokeless tobacco: Never Used  . Alcohol use No     Allergies   Lobster [shellfish allergy]   Review of Systems Review of Systems  Constitutional: Negative for fever.  Gastrointestinal: Positive for abdominal pain, diarrhea, nausea and vomiting.    Genitourinary: Negative for pelvic pain.  All other systems reviewed and are negative.    Physical Exam Updated Vital Signs BP 155/88 (BP Location: Left Arm)   Pulse 86   Temp 98.9 F (37.2 C) (Oral)   Resp 16   Ht 5\' 7"  (1.702 m)   Wt 219 lb (99.3 kg)   LMP 06/09/2016   SpO2 99%   BMI 34.30 kg/m   Physical Exam  Constitutional: She is oriented to person, place, and time. She appears well-developed and well-nourished. No distress.  HENT:  Head: Normocephalic and atraumatic.  Right Ear: Hearing normal.  Left Ear: Hearing normal.  Nose: Nose normal.  Mouth/Throat: Oropharynx is clear and moist and mucous membranes are normal.  Eyes: Conjunctivae and EOM are normal. Pupils are equal, round, and reactive to light.  Neck: Normal range of motion. Neck supple.  Cardiovascular: Regular rhythm, S1 normal and S2 normal.  Exam reveals no gallop and no friction rub.   No murmur heard. Pulmonary/Chest: Effort normal and breath sounds normal. No respiratory distress. She exhibits no tenderness.  Abdominal: Soft. Normal appearance and bowel sounds are normal. There is no hepatosplenomegaly. There is tenderness. There is no rebound, no guarding, no tenderness at McBurney's point and negative Murphy's sign. No hernia.  LUQ tenderness  Musculoskeletal: Normal range of motion.  Neurological: She is alert and oriented to person, place, and time. She has normal strength. No cranial nerve deficit or sensory deficit. Coordination normal. GCS eye subscore is 4. GCS verbal subscore is 5. GCS motor subscore is 6.  Skin: Skin is warm, dry and intact. No rash noted. No cyanosis.  Psychiatric: She has a normal mood and affect. Her speech is normal and behavior is normal. Thought content normal.  Nursing note and vitals reviewed.    ED Treatments / Results  DIAGNOSTIC STUDIES: Oxygen Saturation is 99% on RA, normal by my interpretation.    COORDINATION OF CARE: 3:04 AM Discussed treatment plan  with pt at bedside which includes lab work and pt agreed to plan.  Labs (all labs ordered are listed, but only abnormal results are displayed) Labs Reviewed  COMPREHENSIVE METABOLIC PANEL - Abnormal; Notable for the following:       Result Value   Sodium 134 (*)    Potassium 3.3 (*)    CO2 21 (*)    Glucose, Bld 351 (*)    Alkaline Phosphatase 158 (*)    All other components within normal limits  URINALYSIS, ROUTINE W REFLEX MICROSCOPIC (NOT AT [facility]) - Abnormal; Notable for the following:    Specific Gravity, Urine 1.043 (*)    Glucose, UA >1000 (*)    All other components within normal limits  URINE MICROSCOPIC-ADD ON - Abnormal; Notable for the following:    Squamous Epithelial / LPF 0-5 (*)    Bacteria, UA RARE (*)  All other components within normal limits  LIPASE, BLOOD  CBC  POC URINE PREG, ED    EKG  EKG Interpretation None       Radiology No results found.  Procedures Procedures (including critical care time)  Medications Ordered in ED Medications  sodium chloride 0.9 % bolus 1,000 mL (1,000 mLs Intravenous New Bag/Given 06/30/16 0310)  ondansetron (ZOFRAN) injection 4 mg (4 mg Intravenous Given 06/30/16 0310)  diphenoxylate-atropine (LOMOTIL) 2.5-0.025 MG per tablet 2 tablet (2 tablets Oral Given 06/30/16 0311)     Initial Impression / Assessment and Plan / ED Course  I have reviewed the triage vital signs and the nursing notes.  Pertinent labs & imaging results that were available during my care of the patient were reviewed by me and considered in my medical decision making (see chart for details).  Clinical Course    Patient presents to the ER for evaluation of nausea, vomiting, diarrhea with abdominal pain and cramping. Patient expressing left upper abdominal pain. She does have mild tenderness in the area but no signs of peritonitis or acute surgical process. Lab work was unremarkable. Patient much improved after IV hydration, Zofran and Lomotil. No  further nausea, vomiting or diarrhea in the ER. She is tolerating oral intake. Pain is resolved. Patient does not require any further workup, appropriate for discharge with further symptomatic treatment. She is a diabetic, blood sugar was elevated here. Improved with IV fluids, was given small dose of insulin as well.  Final Clinical Impressions(s) / ED Diagnoses   Final diagnoses:  None  Vomiting Diarrhea Hyperglycemia New Prescriptions New Prescriptions   No medications on file   I personally performed the services described in this documentation, which was scribed in my presence. The recorded information has been reviewed and is accurate.      Orpah Greek, MD 06/30/16 [phone]

## 2016-08-01 ENCOUNTER — Encounter (HOSPITAL_COMMUNITY): Payer: Self-pay | Admitting: *Deleted

## 2016-08-01 ENCOUNTER — Emergency Department (HOSPITAL_COMMUNITY)
Admission: EM | Admit: 2016-08-01 | Discharge: 2016-08-01 | Disposition: A | Discharge status: Home / Self Care | Payer: PRIVATE HEALTH INSURANCE | Attending: Emergency Medicine | Admitting: Emergency Medicine

## 2016-08-01 DIAGNOSIS — Z87891 Personal history of nicotine dependence: Secondary | ICD-10-CM | POA: Insufficient documentation

## 2016-08-01 DIAGNOSIS — Z7984 Long term (current) use of oral hypoglycemic drugs: Secondary | ICD-10-CM | POA: Insufficient documentation

## 2016-08-01 DIAGNOSIS — J45909 Unspecified asthma, uncomplicated: Secondary | ICD-10-CM | POA: Insufficient documentation

## 2016-08-01 DIAGNOSIS — E119 Type 2 diabetes mellitus without complications: Secondary | ICD-10-CM | POA: Insufficient documentation

## 2016-08-01 DIAGNOSIS — B001 Herpesviral vesicular dermatitis: Secondary | ICD-10-CM | POA: Insufficient documentation

## 2016-08-01 DIAGNOSIS — Z79899 Other long term (current) drug therapy: Secondary | ICD-10-CM | POA: Insufficient documentation

## 2016-08-01 DIAGNOSIS — R51 Headache: Secondary | ICD-10-CM | POA: Insufficient documentation

## 2016-08-01 DIAGNOSIS — I10 Essential (primary) hypertension: Secondary | ICD-10-CM | POA: Insufficient documentation

## 2016-08-01 DIAGNOSIS — R519 Headache, unspecified: Secondary | ICD-10-CM

## 2016-08-01 LAB — CBG MONITORING, ED: Glucose-Capillary: 194 mg/dL — ABNORMAL HIGH (ref 65–99)

## 2016-08-01 MED ORDER — KETOROLAC TROMETHAMINE 30 MG/ML IJ SOLN
30.0000 mg | Freq: Once | INTRAMUSCULAR | Status: AC
  Administered 2016-08-01: 30 mg via INTRAVENOUS
  Filled 2016-08-01: qty 1

## 2016-08-01 MED ORDER — DIPHENHYDRAMINE HCL 50 MG/ML IJ SOLN
12.5000 mg | Freq: Once | INTRAMUSCULAR | Status: AC
  Administered 2016-08-01: 12.5 mg via INTRAVENOUS
  Filled 2016-08-01: qty 1

## 2016-08-01 MED ORDER — SODIUM CHLORIDE 0.9 % IV BOLUS (SEPSIS)
1000.0000 mL | Freq: Once | INTRAVENOUS | Status: AC
  Administered 2016-08-01: 1000 mL via INTRAVENOUS

## 2016-08-01 MED ORDER — METOCLOPRAMIDE HCL 5 MG/ML IJ SOLN
10.0000 mg | Freq: Once | INTRAMUSCULAR | Status: AC
  Administered 2016-08-01: 10 mg via INTRAVENOUS
  Filled 2016-08-01: qty 2

## 2016-08-01 MED ORDER — VALACYCLOVIR HCL 1 G PO TABS: 2000.0000 mg | ORAL_TABLET | Freq: Once | ORAL | 0 refills | Status: AC

## 2016-08-01 MED ORDER — BUTALBITAL-APAP-CAFFEINE 50-325-40 MG PO TABS: 1.0000 | ORAL_TABLET | Freq: Four times a day (QID) | ORAL | 0 refills | Status: AC | PRN

## 2016-08-01 NOTE — ED Notes (Signed)
Pt ambulatory and independent at discharge.  Verbalized understanding of discharge instructions 

## 2016-08-01 NOTE — Discharge Instructions (Signed)
Take the Valtrex as prescribed for your cold sore. I gave you a prescription for Fioricet to take as needed for headache as well. Follow up with neurology if you continue to get frequent headaches. Return to the emergency room for new or worsening symptoms.

## 2016-08-01 NOTE — ED Triage Notes (Signed)
Pt complains of headache since last night, right ear ache x 2 days, and sore on her right lower lip since this morning. Pt took Excedrin for headache with no relief.

## 2016-08-01 NOTE — ED Provider Notes (Signed)
[facility] DEPT Provider Note   CSN: [id number] Arrival date & time: 08/01/16  1422  History    HPI   Wendy Olson is an 40 y.o. female with history of HTN and DM who presents to the ED for evaluation of multiple complaints. She reports a right sided headache behind her right eye for the past two days. Reports gradual onset, throbbing in nature. Radiates to her right parietal region. Endorses associated photophobia. Denies blurred vision, nausea, vomiting. Denies fever or chills. Denies new numbness or weakness. She has tried excederin with no relief.   Pt also complaining of sharp right sided ear pain for the past two days. Denies discharge. Denies muffled hearing or tinnitus. Denies dizziness. Denies sore throat. She does report she has multiple teeth with cavities. Denies h/o ear infections.  Lastly, pt reports rash to her lower lip that she noticed upon waking this morning. She states the lesion is burning and painful. It has since crusted over. Has never had a rash like this before. Denies rash anywhere else on her body. Denies recent cough or congestion. Denies new foods or exposures.   Past Medical History:  Diagnosis Date  . 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 Anxiety   . Asthma   . Diabetes mellitus without complication ([place])    type 2  . Hypertension   . Seasonal allergies     Patient Active Problem List   Diagnosis Date Noted  . Hypertension 12/14/2015  . Type 2 diabetes mellitus with hyperglycemia, without long-term current use of insulin ([place]) 11/01/2015    Past Surgical History:  Procedure Laterality Date  . TUBAL LIGATION  2009    OB History    No data available       Home Medications    Prior to Admission medications   Medication Sig Start Date End Date Taking? Authorizing Provider  Blood Glucose Monitoring Suppl (TRUE METRIX METER) DEVI 1 each by Does not apply route 3 (three) times daily before meals. Patient not taking: Reported on 06/30/2016 11/01/15    Arnoldo Morale, MD  Chlorpheniramine Maleate (ALLERGY PO) Take 1 tablet by mouth daily as needed (allergies.).    Historical Provider, MD  diphenoxylate-atropine (LOMOTIL) 2.5-0.025 MG tablet Take 2 tablets by mouth 4 (four) times daily as needed for diarrhea or loose stools. 06/30/16   Orpah Greek, MD  glipiZIDE (GLUCOTROL) 5 MG tablet Take 1 tablet (5 mg total) by mouth 2 (two) times daily before a meal. 12/14/15   Arnoldo Morale, MD  glucose blood (TRUE METRIX BLOOD GLUCOSE TEST) test strip Use as directed 3 times daily before meals Patient not taking: Reported on 06/30/2016 12/14/15   Arnoldo Morale, MD  HYDROcodone-acetaminophen (NORCO) 5-325 MG tablet Take 1 tablet by mouth every 6 (six) hours as needed for moderate pain. Patient not taking: Reported on 04/14/2016 02/19/16   Melony Overly, MD  ibuprofen (ADVIL,MOTRIN) 600 MG tablet Take 1 tablet (600 mg total) by mouth every 6 (six) hours as needed for moderate pain. Patient not taking: Reported on 06/30/2016 02/19/16   Melony Overly, MD  lisinopril (PRINIVIL,ZESTRIL) 5 MG tablet Take 1 tablet (5 mg total) by mouth daily. 12/14/15   Arnoldo Morale, MD  metFORMIN (GLUCOPHAGE) 500 MG tablet 1 tablet (500 mg) by mouth twice daily for one week then 2 tabs (1000 mg) twice daily Patient taking differently: Take 1,000 mg by mouth 2 (two) times daily.  12/14/15   Arnoldo Morale, MD  ondansetron (ZOFRAN) 4 MG  tablet Take 1 tablet (4 mg total) by mouth every 6 (six) hours. Patient not taking: Reported on 06/30/2016 04/14/16   Okey Regal, PA-C  promethazine (PHENERGAN) 25 MG tablet Take 1 tablet (25 mg total) by mouth every 6 (six) hours as needed for nausea or vomiting. 06/30/16   Orpah Greek, MD  TRUEPLUS LANCETS 28G MISC 1 each by Does not apply route 3 (three) times daily before meals. Patient not taking: Reported on 06/30/2016 11/01/15   Arnoldo Morale, MD    Family History Family History  Problem Relation Age of Onset  . Diabetes Mother   .  Hypertension Mother   . Dementia Mother   . Diabetes Father   . Hypertension Father     Social History Social History  Substance Use Topics  . Smoking status: Former Smoker    Quit date: 01/25/2007  . Smokeless tobacco: Never Used  . Alcohol use No     Allergies   Lobster [shellfish allergy]   Review of Systems Review of Systems  All other systems reviewed and are negative.    Physical Exam Updated Vital Signs BP 116/75   Pulse 70   Temp 98.3 F (36.8 C)   Resp 18   LMP 08/01/2016   SpO2 97%   Physical Exam  Constitutional: She is oriented to person, place, and time.  HENT:  Right Ear: External ear normal.  Left Ear: External ear normal.  Nose: Nose normal.  Mouth/Throat: Oropharynx is clear and moist. No oropharyngeal exudate.  Central lower lip with crusted over cluster of lesions. Mild edema. No intraoral lesions.  Multiple broken and carious teeth. Dentition is generally poor. Posterior right sided maxillary molars with some areas of decay and surrounding gingival bogginess. No trismus. Uvula midline.  Bilateral TM with nonpurulent effusion. No TM injection. No canal edema or erythema. No tragal or mastoid tenderness.  Eyes: Conjunctivae and EOM are normal. Pupils are equal, round, and reactive to light.  Neck: Normal range of motion. Neck supple.  No meningismus  Cardiovascular: Normal rate, regular rhythm, normal heart sounds and intact distal pulses.   Pulmonary/Chest: Effort normal and breath sounds normal. No respiratory distress. She has no wheezes.  Abdominal: Soft. Bowel sounds are normal. She exhibits no distension. There is no tenderness. There is no rebound and no guarding.  Musculoskeletal: She exhibits no edema.  Lymphadenopathy:    She has no cervical adenopathy.  Neurological: She is alert and oriented to person, place, and time. No cranial nerve deficit.  5/5 strength throughout Normal finger to nose No pronator drift DTR normal  Skin:  Skin is warm and dry.  Psychiatric: She has a normal mood and affect.  Nursing note and vitals reviewed.    ED Treatments / Results  Labs (all labs ordered are listed, but only abnormal results are displayed) Labs Reviewed  CBG MONITORING, ED - Abnormal; Notable for the following:       Result Value   Glucose-Capillary 194 (*)    All other components within normal limits    EKG  EKG Interpretation None       Radiology No results found.  Procedures Procedures (including critical care time)  Medications Ordered in ED Medications  sodium chloride 0.9 % bolus 1,000 mL (0 mLs Intravenous Stopped 08/01/16 2006)  ketorolac (TORADOL) 30 MG/ML injection 30 mg (30 mg Intravenous Given 08/01/16 1901)  metoCLOPramide (REGLAN) injection 10 mg (10 mg Intravenous Given 08/01/16 1902)  diphenhydrAMINE (BENADRYL) injection 12.5 mg (12.5 mg  Intravenous Given 08/01/16 1902)     Initial Impression / Assessment and Plan / ED Course  I have reviewed the triage vital signs and the nursing notes.  Pertinent labs & imaging results that were available during my care of the patient were reviewed by me and considered in my medical decision making (see chart for details).  Clinical Course    Headache resolved. Neuro exam intact. No meningismus. Will treat herpes labialis with dose of valtrex. Her ear pain may be related to her poor dentition. Encouraged close PCP and dental follow up. ER return precautions given.  Final Clinical Impressions(s) / ED Diagnoses   Final diagnoses:  Herpes labialis  Acute nonintractable headache, unspecified headache type    New Prescriptions Discharge Medication List as of 08/01/2016  7:54 PM    START taking these medications   Details  butalbital-acetaminophen-caffeine (FIORICET) 50-325-40 MG tablet Take 1-2 tablets by mouth every 6 (six) hours as needed for headache., Starting Wed 08/01/2016, Until Thu 08/01/2017, Print    valACYclovir (VALTREX) 1000 MG tablet Take  2 tablets (2,000 mg total) by mouth once., Starting Wed 08/01/2016, Print         Anne Ng, PA-C 08/02/16 0028    Davonna Belling, MD 08/02/16 [phone]

## 2016-08-25 ENCOUNTER — Encounter (HOSPITAL_COMMUNITY): Payer: Self-pay | Admitting: Emergency Medicine

## 2016-08-25 ENCOUNTER — Emergency Department (HOSPITAL_COMMUNITY)
Admission: EM | Admit: 2016-08-25 | Discharge: 2016-08-25 | Disposition: A | Discharge status: Home / Self Care | Payer: PRIVATE HEALTH INSURANCE | Attending: Emergency Medicine | Admitting: Emergency Medicine

## 2016-08-25 DIAGNOSIS — J45909 Unspecified asthma, uncomplicated: Secondary | ICD-10-CM | POA: Insufficient documentation

## 2016-08-25 DIAGNOSIS — Z87891 Personal history of nicotine dependence: Secondary | ICD-10-CM | POA: Insufficient documentation

## 2016-08-25 DIAGNOSIS — Z79899 Other long term (current) drug therapy: Secondary | ICD-10-CM | POA: Insufficient documentation

## 2016-08-25 DIAGNOSIS — H1012 Acute atopic conjunctivitis, left eye: Secondary | ICD-10-CM | POA: Insufficient documentation

## 2016-08-25 DIAGNOSIS — Z7984 Long term (current) use of oral hypoglycemic drugs: Secondary | ICD-10-CM | POA: Insufficient documentation

## 2016-08-25 DIAGNOSIS — E119 Type 2 diabetes mellitus without complications: Secondary | ICD-10-CM | POA: Insufficient documentation

## 2016-08-25 MED ORDER — NAPHAZOLINE-PHENIRAMINE 0.025-0.3 % OP SOLN: 2.0000 [drp] | OPHTHALMIC | 0 refills | Status: DC | PRN

## 2016-08-25 MED ORDER — CETIRIZINE HCL 10 MG PO TABS: 10.0000 mg | ORAL_TABLET | Freq: Every day | ORAL | 0 refills | Status: DC

## 2016-08-25 MED ORDER — FLUORESCEIN SODIUM 1 MG OP STRP
1.0000 | ORAL_STRIP | Freq: Once | OPHTHALMIC | Status: AC
  Administered 2016-08-25: 1 via OPHTHALMIC
  Filled 2016-08-25: qty 1

## 2016-08-25 MED ORDER — TETRACAINE HCL 0.5 % OP SOLN
1.0000 [drp] | Freq: Once | OPHTHALMIC | Status: AC
  Administered 2016-08-25: 1 [drp] via OPHTHALMIC
  Filled 2016-08-25: qty 4

## 2016-08-25 NOTE — ED Triage Notes (Signed)
Pt c/o her left eye being swollen and itchy.  Pt says it started on Thursday when she was at work but denies anything coming in contact with her eye.

## 2016-08-25 NOTE — ED Provider Notes (Signed)
Wendy Olson Provider Note   CSN: [id number] Arrival date & time: 08/25/16  1325  By signing my name below, I, Wendy Olson, attest that this documentation has been prepared under the direction and in the presence of [name], PA-C. Electronically Signed: Judithann Sauger, ED Scribe. 08/25/16. 2:57 PM.   History   Chief Complaint Chief Complaint  Patient presents with  . Eye Pain    HPI Comments: Wendy Olson is a 40 y.o. female with a hx of DM, hypertension, and seasonal allergies who presents to the Emergency Department complaining of gradually worsening moderate left eye itchiness and redness with watery discharge onset 2 days ago. She notes that the itchiness is worse on the lateral aspect of the left outer eye. No alleviating factors noted. She states that she has tried Benadryl with no relief. She denies any recent injury to the eye, new facial lotions, or new eye medications. She denies any fever, chills, or eye pain.   The history is provided by the patient. No language interpreter was used.    Past Medical History:  Diagnosis Date  . 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 Anxiety   . Asthma   . Diabetes mellitus without complication ([place])    type 2  . Hypertension   . Seasonal allergies     Patient Active Problem List   Diagnosis Date Noted  . Hypertension 12/14/2015  . Type 2 diabetes mellitus with hyperglycemia, without long-term current use of insulin ([place]) 11/01/2015    Past Surgical History:  Procedure Laterality Date  . TUBAL LIGATION  2009    OB History    No data available       Home Medications    Prior to Admission medications   Medication Sig Start Date End Date Taking? Authorizing Provider  Blood Glucose Monitoring Suppl (TRUE METRIX METER) DEVI 1 each by Does not apply route 3 (three) times daily before meals. Patient not taking: Reported on 06/30/2016 11/01/15   Arnoldo Morale, MD  butalbital-acetaminophen-caffeine (FIORICET) 50-325-40 MG  tablet Take 1-2 tablets by mouth every 6 (six) hours as needed for headache. 08/01/16 08/01/17  Olivia Canter Tinesha Siegrist, PA-C  Chlorpheniramine Maleate (ALLERGY PO) Take 1 tablet by mouth daily as needed (allergies.).    Historical Provider, MD  diphenoxylate-atropine (LOMOTIL) 2.5-0.025 MG tablet Take 2 tablets by mouth 4 (four) times daily as needed for diarrhea or loose stools. 06/30/16   Orpah Greek, MD  glipiZIDE (GLUCOTROL) 5 MG tablet Take 1 tablet (5 mg total) by mouth 2 (two) times daily before a meal. 12/14/15   Arnoldo Morale, MD  glucose blood (TRUE METRIX BLOOD GLUCOSE TEST) test strip Use as directed 3 times daily before meals Patient not taking: Reported on 06/30/2016 12/14/15   Arnoldo Morale, MD  HYDROcodone-acetaminophen (NORCO) 5-325 MG tablet Take 1 tablet by mouth every 6 (six) hours as needed for moderate pain. Patient not taking: Reported on 04/14/2016 02/19/16   Melony Overly, MD  ibuprofen (ADVIL,MOTRIN) 600 MG tablet Take 1 tablet (600 mg total) by mouth every 6 (six) hours as needed for moderate pain. Patient not taking: Reported on 06/30/2016 02/19/16   Melony Overly, MD  lisinopril (PRINIVIL,ZESTRIL) 5 MG tablet Take 1 tablet (5 mg total) by mouth daily. 12/14/15   Arnoldo Morale, MD  metFORMIN (GLUCOPHAGE) 500 MG tablet 1 tablet (500 mg) by mouth twice daily for one week then 2 tabs (1000 mg) twice daily Patient taking differently: Take 1,000 mg by mouth 2 (two)  times daily.  12/14/15   Arnoldo Morale, MD  ondansetron (ZOFRAN) 4 MG tablet Take 1 tablet (4 mg total) by mouth every 6 (six) hours. Patient not taking: Reported on 06/30/2016 04/14/16   Okey Regal, PA-C  promethazine (PHENERGAN) 25 MG tablet Take 1 tablet (25 mg total) by mouth every 6 (six) hours as needed for nausea or vomiting. 06/30/16   Orpah Greek, MD  TRUEPLUS LANCETS 28G MISC 1 each by Does not apply route 3 (three) times daily before meals. Patient not taking: Reported on 06/30/2016 11/01/15   Arnoldo Morale, MD     Family History Family History  Problem Relation Age of Onset  . Diabetes Mother   . Hypertension Mother   . Dementia Mother   . Diabetes Father   . Hypertension Father     Social History Social History  Substance Use Topics  . Smoking status: Former Smoker    Quit date: 01/25/2007  . Smokeless tobacco: Never Used  . Alcohol use No     Allergies   Lobster [shellfish allergy]   Review of Systems Review of Systems  Constitutional: Negative for chills and fever.  Eyes: Positive for discharge, redness, itching and visual disturbance. Negative for pain.  All other systems reviewed and are negative.    Physical Exam Updated Vital Signs BP 118/88 (BP Location: Right Arm)   Pulse 90   Temp 98.4 F (36.9 C) (Oral)   Resp 17   Ht 5\' 7"  (1.702 m)   Wt 221 lb (100.2 kg)   LMP 08/01/2016   SpO2 99%   BMI 34.61 kg/m   Physical Exam  Constitutional: She is oriented to person, place, and time. She appears well-developed and well-nourished. No distress.  HENT:  Head: Normocephalic and atraumatic.  Eyes: EOM are normal. Pupils are equal, round, and reactive to light.  Mild bilateral bulbar conjunctival injection, though left mildly worse than right. No stye or chalazion seen. No periorbital erythema or edema bilaterally. No discharge. No fluorescein uptake on exam.  Neck: Neck supple. No tracheal deviation present.  Cardiovascular: Normal rate.   Pulmonary/Chest: Effort normal. No respiratory distress.  Musculoskeletal: Normal range of motion.  Neurological: She is alert and oriented to person, place, and time.  Skin: Skin is warm and dry.  Psychiatric: She has a normal mood and affect. Her behavior is normal.  Nursing note and vitals reviewed.    Visual Acuity  Right Eye Distance: 20/30 Left Eye Distance: 20/30 Bilateral Distance: 20/30  Right Eye Near:   Left Eye Near:    Bilateral Near:      ED Treatments / Results  DIAGNOSTIC STUDIES: Oxygen Saturation is  99% on RA, normal by my interpretation.    COORDINATION OF CARE: 2:45 PM- Pt advised of plan for treatment and pt agrees. Will use fluorescein strip and tetracaine for further evaluation.    Labs (all labs ordered are listed, but only abnormal results are displayed) Labs Reviewed - No data to display  EKG  EKG Interpretation None       Radiology No results found.  Procedures Procedures (including critical care time)  Medications Ordered in ED Medications - No data to display   Initial Impression / Assessment and Plan / ED Course  Delrae Rend, PA-C has reviewed the triage vital signs and the nursing notes.  Pertinent labs & imaging results that were available during my care of the patient were reviewed by me and considered in my medical decision making (see chart  for details).  Clinical Course   Suspect allergic etiology given report of itchiness, though not painful. Pt reports symptoms mostly in left eye though both of her eyes have some conjunctival injection in bulbar conjunctiva. Will cover with system and topical antihistamines. Instructed close f/u with ophtho. No evidence of iritis, uveitis, septal cellulitis, or other emergent condition requiring further workup.  Final Clinical Impressions(s) / ED Diagnoses   Final diagnoses:  Allergic conjunctivitis of left eye    New Prescriptions Current Discharge Medication List    START taking these medications   Details  cetirizine (ZYRTEC) 10 MG tablet Take 1 tablet (10 mg total) by mouth daily. Qty: 14 tablet, Refills: 0    naphazoline-pheniramine (NAPHCON-A) 0.025-0.3 % ophthalmic solution Place 2 drops into the left eye every 4 (four) hours as needed for irritation. Qty: 5 mL, Refills: 0       I personally performed the services described in this documentation, which was scribed in my presence. The recorded information has been reviewed and is accurate.    Anne Ng, PA-C 08/25/16 [place],  MD 08/26/16 [phone]

## 2016-08-25 NOTE — ED Notes (Signed)
Bed: WTR9 Expected date:  Expected time:  Means of arrival:  Comments: 

## 2016-11-30 ENCOUNTER — Emergency Department (HOSPITAL_COMMUNITY)
Admission: EM | Admit: 2016-11-30 | Discharge: 2016-11-30 | Disposition: A | Discharge status: Home / Self Care | Payer: Self-pay | Attending: Emergency Medicine | Admitting: Emergency Medicine

## 2016-11-30 ENCOUNTER — Encounter (HOSPITAL_COMMUNITY): Payer: Self-pay

## 2016-11-30 ENCOUNTER — Emergency Department (HOSPITAL_COMMUNITY): Payer: Self-pay

## 2016-11-30 DIAGNOSIS — Z7984 Long term (current) use of oral hypoglycemic drugs: Secondary | ICD-10-CM | POA: Insufficient documentation

## 2016-11-30 DIAGNOSIS — Z79899 Other long term (current) drug therapy: Secondary | ICD-10-CM | POA: Insufficient documentation

## 2016-11-30 DIAGNOSIS — M25531 Pain in right wrist: Secondary | ICD-10-CM | POA: Insufficient documentation

## 2016-11-30 DIAGNOSIS — J45909 Unspecified asthma, uncomplicated: Secondary | ICD-10-CM | POA: Insufficient documentation

## 2016-11-30 DIAGNOSIS — Z87891 Personal history of nicotine dependence: Secondary | ICD-10-CM | POA: Insufficient documentation

## 2016-11-30 DIAGNOSIS — Y929 Unspecified place or not applicable: Secondary | ICD-10-CM | POA: Insufficient documentation

## 2016-11-30 DIAGNOSIS — I10 Essential (primary) hypertension: Secondary | ICD-10-CM | POA: Insufficient documentation

## 2016-11-30 DIAGNOSIS — Y990 Civilian activity done for income or pay: Secondary | ICD-10-CM | POA: Insufficient documentation

## 2016-11-30 DIAGNOSIS — M79641 Pain in right hand: Secondary | ICD-10-CM | POA: Insufficient documentation

## 2016-11-30 DIAGNOSIS — E119 Type 2 diabetes mellitus without complications: Secondary | ICD-10-CM | POA: Insufficient documentation

## 2016-11-30 DIAGNOSIS — X503XXA Overexertion from repetitive movements, initial encounter: Secondary | ICD-10-CM | POA: Insufficient documentation

## 2016-11-30 DIAGNOSIS — Y939 Activity, unspecified: Secondary | ICD-10-CM | POA: Insufficient documentation

## 2016-11-30 MED ORDER — NAPROXEN 500 MG PO TABS: 500.0000 mg | ORAL_TABLET | Freq: Two times a day (BID) | ORAL | 0 refills | Status: DC | PRN

## 2016-11-30 NOTE — Discharge Instructions (Signed)
Read the information below.  Use the prescribed medication as directed.  Please discuss all new medications with your pharmacist.  You may return to the Emergency Department at any time for worsening condition or any new symptoms that concern you.    If you develop uncontrolled pain, weakness or numbness of the extremity, severe discoloration of the skin, or you are unable to move your hand or fingers, return to the ER for a recheck.

## 2016-11-30 NOTE — ED Notes (Signed)
Patient transported to X-ray 

## 2016-11-30 NOTE — ED Provider Notes (Signed)
[facility] DEPT Provider Note   CSN: [id number] Arrival date & time: 11/30/16  1040   By signing my name below, I, Neta Mends, attest that this documentation has been prepared under the direction and in the presence of Jaaziel Peatross, [place]. Electronically Signed: Neta Mends, ED Scribe. 11/30/2016. 10:58 AM.   History   Chief Complaint Chief Complaint  Patient presents with  . Hand Pain   The history is provided by the patient. No language interpreter was used.   HPI Comments:  Wendy Olson is a 41 y.o. female with PMHx of DM who presents to the Emergency Department complaining of constant right hand and wrist pain x 1 week. Pt states that the pain radiates from her wrist to her right fingers. Pt states that the pain is worsened with rotation. Pt works at [employer] and reports doing many repetitive motions at work. She denies any injury/trauma. Pt has taken tylenol with mild, temporary relief. Pt denies numbness, weakness, fever, and pain elsewhere.   Past Medical History:  Diagnosis Date  . 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 Anxiety   . Asthma   . Diabetes mellitus without complication ([place])    type 2  . Hypertension   . Seasonal allergies     Patient Active Problem List   Diagnosis Date Noted  . Hypertension 12/14/2015  . Type 2 diabetes mellitus with hyperglycemia, without long-term current use of insulin ([place]) 11/01/2015    Past Surgical History:  Procedure Laterality Date  . TUBAL LIGATION  2009    OB History    No data available       Home Medications    Prior to Admission medications   Medication Sig Start Date End Date Taking? Authorizing Provider  Blood Glucose Monitoring Suppl (TRUE METRIX METER) DEVI 1 each by Does not apply route 3 (three) times daily before meals. Patient not taking: Reported on 06/30/2016 11/01/15   Arnoldo Morale, MD  butalbital-acetaminophen-caffeine (FIORICET) 50-325-40 MG tablet Take 1-2 tablets by mouth every 6 (six) hours  as needed for headache. 08/01/16 08/01/17  Olivia Canter Sam, PA-C  cetirizine (ZYRTEC) 10 MG tablet Take 1 tablet (10 mg total) by mouth daily. 08/25/16   Olivia Canter Sam, PA-C  Chlorpheniramine Maleate (ALLERGY PO) Take 1 tablet by mouth daily as needed (allergies.).    Historical Provider, MD  diphenoxylate-atropine (LOMOTIL) 2.5-0.025 MG tablet Take 2 tablets by mouth 4 (four) times daily as needed for diarrhea or loose stools. 06/30/16   Orpah Greek, MD  glipiZIDE (GLUCOTROL) 5 MG tablet Take 1 tablet (5 mg total) by mouth 2 (two) times daily before a meal. 12/14/15   Arnoldo Morale, MD  glucose blood (TRUE METRIX BLOOD GLUCOSE TEST) test strip Use as directed 3 times daily before meals Patient not taking: Reported on 06/30/2016 12/14/15   Arnoldo Morale, MD  HYDROcodone-acetaminophen (NORCO) 5-325 MG tablet Take 1 tablet by mouth every 6 (six) hours as needed for moderate pain. Patient not taking: Reported on 04/14/2016 02/19/16   Melony Overly, MD  ibuprofen (ADVIL,MOTRIN) 600 MG tablet Take 1 tablet (600 mg total) by mouth every 6 (six) hours as needed for moderate pain. Patient not taking: Reported on 06/30/2016 02/19/16   Melony Overly, MD  lisinopril (PRINIVIL,ZESTRIL) 5 MG tablet Take 1 tablet (5 mg total) by mouth daily. 12/14/15   Arnoldo Morale, MD  metFORMIN (GLUCOPHAGE) 500 MG tablet 1 tablet (500 mg) by mouth twice daily for one week then 2  tabs (1000 mg) twice daily Patient taking differently: Take 1,000 mg by mouth 2 (two) times daily.  12/14/15   Arnoldo Morale, MD  naphazoline-pheniramine (NAPHCON-A) 0.025-0.3 % ophthalmic solution Place 2 drops into the left eye every 4 (four) hours as needed for irritation. 08/25/16   Olivia Canter Sam, PA-C  naproxen (NAPROSYN) 500 MG tablet Take 1 tablet (500 mg total) by mouth 2 (two) times daily as needed for mild pain or moderate pain. 11/30/16   Clayton Bibles, PA-C  ondansetron (ZOFRAN) 4 MG tablet Take 1 tablet (4 mg total) by mouth every 6 (six) hours. Patient not  taking: Reported on 06/30/2016 04/14/16   Okey Regal, PA-C  promethazine (PHENERGAN) 25 MG tablet Take 1 tablet (25 mg total) by mouth every 6 (six) hours as needed for nausea or vomiting. 06/30/16   Orpah Greek, MD  TRUEPLUS LANCETS 28G MISC 1 each by Does not apply route 3 (three) times daily before meals. Patient not taking: Reported on 06/30/2016 11/01/15   Arnoldo Morale, MD    Family History Family History  Problem Relation Age of Onset  . Diabetes Mother   . Hypertension Mother   . Dementia Mother   . Diabetes Father   . Hypertension Father     Social History Social History  Substance Use Topics  . Smoking status: Former Smoker    Quit date: 01/25/2007  . Smokeless tobacco: Never Used  . Alcohol use No     Allergies   Lobster [shellfish allergy]   Review of Systems Review of Systems  Constitutional: Negative for chills and fever.  Musculoskeletal: Positive for arthralgias. Negative for joint swelling and myalgias.  Skin: Negative for color change.  Allergic/Immunologic: Negative for immunocompromised state.  Neurological: Negative for weakness and numbness.  Hematological: Does not bruise/bleed easily.  Psychiatric/Behavioral: Negative for self-injury.     Physical Exam Updated Vital Signs BP 127/76 (BP Location: Left Arm)   Pulse 99   Temp 98.1 F (36.7 C) (Oral)   Resp 17   Ht 5\' 7"  (1.702 m)   Wt 99.3 kg   LMP 11/09/2016   SpO2 99%   BMI 34.30 kg/m   Physical Exam  Constitutional: She appears well-developed and well-nourished. No distress.  HENT:  Head: Normocephalic and atraumatic.  Neck: Neck supple.  Pulmonary/Chest: Effort normal.  Musculoskeletal: She exhibits no deformity.  Right distal radius and ulna with tenderness to palpation, tenderness throughout 5th metacarpal and 5th digit.  Few mild abrasions over dorsum of hand.  No other focal tenderness.  No snuffbox tenderness.  Full active range of motion of all digits, strength 5/5,  sensation intact, capillary refill < 2 seconds.    Neurological: She is alert.  Skin: She is not diaphoretic.  Nursing note and vitals reviewed.    ED Treatments / Results  DIAGNOSTIC STUDIES:  Oxygen Saturation is 100% on RA, normal by my interpretation.    COORDINATION OF CARE:  10:58 AM Discussed treatment plan with pt at bedside and pt agreed to plan.   Labs (all labs ordered are listed, but only abnormal results are displayed) Labs Reviewed - No data to display  EKG  EKG Interpretation None       Radiology Dg Wrist Complete Right  Result Date: 11/30/2016 CLINICAL DATA:  Right hand and wrist pain on ulnar side. No known injury. EXAM: RIGHT WRIST - COMPLETE 3+ VIEW COMPARISON:  None. FINDINGS: There is no evidence of fracture or dislocation. There is no evidence of arthropathy  or other focal bone abnormality. Soft tissues are unremarkable. IMPRESSION: Negative. Electronically Signed   By: Rolm Baptise M.D.   On: 11/30/2016 11:24   Dg Hand Complete Right  Result Date: 11/30/2016 CLINICAL DATA:  Right hand and wrist pain.  Painful range of motion. EXAM: RIGHT HAND - COMPLETE 3+ VIEW COMPARISON:  None. FINDINGS: There is no evidence of fracture or dislocation. There is no evidence of arthropathy or other focal bone abnormality. Soft tissues are unremarkable. IMPRESSION: Negative. Electronically Signed   By: Lorriane Shire M.D.   On: 11/30/2016 11:25    Procedures Procedures (including critical care time)  Medications Ordered in ED Medications - No data to display   Initial Impression / Assessment and Plan / ED Course  I have reviewed the triage vital signs and the nursing notes.  Pertinent labs & imaging results that were available during my care of the patient were reviewed by me and considered in my medical decision making (see chart for details).  Clinical Course     Afebrile, nontoxic patient with pain in right wrist and hand without trauma.  Neurovascularly  intact.  Xray negative.    D/C home with velcro splint, RICE, PCP follow up.  Discussed result, findings, treatment, and follow up  with patient.  Pt given return precautions.  Pt verbalizes understanding and agrees with plan.       Final Clinical Impressions(s) / ED Diagnoses   Final diagnoses:  Right hand pain  Right wrist pain    New Prescriptions Discharge Medication List as of 11/30/2016 11:44 AM    START taking these medications   Details  naproxen (NAPROSYN) 500 MG tablet Take 1 tablet (500 mg total) by mouth 2 (two) times daily as needed for mild pain or moderate pain., Starting Fri 11/30/2016, Print        I personally performed the services described in this documentation, which was scribed in my presence. The recorded information has been reviewed and is accurate.     Clayton Bibles, PA-C 11/30/16 [place], MD 12/01/16 1145

## 2016-11-30 NOTE — ED Triage Notes (Signed)
Pt with rt hand pain.  From top of hand to wrist.  Denies injury.  Painful x 1 week.  Worse with rotation.

## 2016-12-30 ENCOUNTER — Emergency Department (HOSPITAL_COMMUNITY)
Admission: EM | Admit: 2016-12-30 | Discharge: 2016-12-30 | Disposition: A | Discharge status: Home / Self Care | Payer: PRIVATE HEALTH INSURANCE | Attending: Emergency Medicine | Admitting: Emergency Medicine

## 2016-12-30 ENCOUNTER — Encounter (HOSPITAL_COMMUNITY): Payer: Self-pay | Admitting: *Deleted

## 2016-12-30 DIAGNOSIS — I10 Essential (primary) hypertension: Secondary | ICD-10-CM | POA: Insufficient documentation

## 2016-12-30 DIAGNOSIS — E119 Type 2 diabetes mellitus without complications: Secondary | ICD-10-CM | POA: Insufficient documentation

## 2016-12-30 DIAGNOSIS — Z79899 Other long term (current) drug therapy: Secondary | ICD-10-CM | POA: Insufficient documentation

## 2016-12-30 DIAGNOSIS — J029 Acute pharyngitis, unspecified: Secondary | ICD-10-CM | POA: Diagnosis present

## 2016-12-30 DIAGNOSIS — Z87891 Personal history of nicotine dependence: Secondary | ICD-10-CM | POA: Insufficient documentation

## 2016-12-30 DIAGNOSIS — Z794 Long term (current) use of insulin: Secondary | ICD-10-CM | POA: Insufficient documentation

## 2016-12-30 LAB — RAPID STREP SCREEN (MED CTR MEBANE ONLY): Streptococcus, Group A Screen (Direct): NEGATIVE

## 2016-12-30 MED ORDER — FLUTICASONE PROPIONATE 50 MCG/ACT NA SUSP: 1.0000 | Freq: Every day | NASAL | 2 refills | Status: DC

## 2016-12-30 MED ORDER — CETIRIZINE HCL 10 MG PO TABS: 10.0000 mg | ORAL_TABLET | Freq: Every day | ORAL | 1 refills | Status: DC

## 2016-12-30 NOTE — ED Triage Notes (Signed)
Pt states, sore throat, dry cough, ears feel full, head hurts from coughing x a couple of days.

## 2016-12-30 NOTE — ED Notes (Signed)
Bed: WTR5 Expected date:  Expected time:  Means of arrival:  Comments: 

## 2016-12-30 NOTE — ED Provider Notes (Signed)
[facility] DEPT Provider Note   CSN: [id number] Arrival date & time: 12/30/16  0827     History   Chief Complaint Chief Complaint  Patient presents with  . Sore Throat    [name] is a 41 y.o. female presenting with sore throat for the last 2 days. She explains that she had flulike symptoms about 5 days ago which resolved after 2 days she thought she was getting better and then started to have a sore throat. She has tried NyQuil last night with some relief to allow her to sleep. She denies the use of antipyretics today or any other medications per pain. She endorses a subjective fever, ear fullness worse on the right and headache, denies chills, nausea, vomiting, diarrhea, myalgias, or other symptoms at this time.  HPI  Past Medical History:  Diagnosis Date  . 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 Anxiety   . Asthma   . Diabetes mellitus without complication ([place])    type 2  . Hypertension   . Seasonal allergies     Patient Active Problem List   Diagnosis Date Noted  . Sore throat 12/30/2016  . Hypertension 12/14/2015  . Type 2 diabetes mellitus with hyperglycemia, without long-term current use of insulin ([place]) 11/01/2015    Past Surgical History:  Procedure Laterality Date  . TUBAL LIGATION  2009    OB History    No data available       Home Medications    Prior to Admission medications   Medication Sig Start Date End Date Taking? Authorizing Provider  Blood Glucose Monitoring Suppl (TRUE METRIX METER) DEVI 1 each by Does not apply route 3 (three) times daily before meals. Patient not taking: Reported on 06/30/2016 11/01/15   Arnoldo Morale, MD  butalbital-acetaminophen-caffeine (FIORICET) 50-325-40 MG tablet Take 1-2 tablets by mouth every 6 (six) hours as needed for headache. 08/01/16 08/01/17  Olivia Canter Sam, PA-C  cetirizine (ZYRTEC) 10 MG tablet Take 1 tablet (10 mg total) by mouth daily. 12/30/16   Emeline General, PA-C  Chlorpheniramine Maleate (ALLERGY PO) Take  1 tablet by mouth daily as needed (allergies.).    Historical Provider, MD  diphenoxylate-atropine (LOMOTIL) 2.5-0.025 MG tablet Take 2 tablets by mouth 4 (four) times daily as needed for diarrhea or loose stools. 06/30/16   Orpah Greek, MD  fluticasone (FLONASE) 50 MCG/ACT nasal spray Place 1 spray into both nostrils daily. 12/30/16   Emeline General, PA-C  glipiZIDE (GLUCOTROL) 5 MG tablet Take 1 tablet (5 mg total) by mouth 2 (two) times daily before a meal. 12/14/15   Arnoldo Morale, MD  glucose blood (TRUE METRIX BLOOD GLUCOSE TEST) test strip Use as directed 3 times daily before meals Patient not taking: Reported on 06/30/2016 12/14/15   Arnoldo Morale, MD  HYDROcodone-acetaminophen (NORCO) 5-325 MG tablet Take 1 tablet by mouth every 6 (six) hours as needed for moderate pain. Patient not taking: Reported on 04/14/2016 02/19/16   Melony Overly, MD  ibuprofen (ADVIL,MOTRIN) 600 MG tablet Take 1 tablet (600 mg total) by mouth every 6 (six) hours as needed for moderate pain. Patient not taking: Reported on 06/30/2016 02/19/16   Melony Overly, MD  lisinopril (PRINIVIL,ZESTRIL) 5 MG tablet Take 1 tablet (5 mg total) by mouth daily. 12/14/15   Arnoldo Morale, MD  metFORMIN (GLUCOPHAGE) 500 MG tablet 1 tablet (500 mg) by mouth twice daily for one week then 2 tabs (1000 mg) twice daily Patient taking differently: Take 1,000  mg by mouth 2 (two) times daily.  12/14/15   Arnoldo Morale, MD  naphazoline-pheniramine (NAPHCON-A) 0.025-0.3 % ophthalmic solution Place 2 drops into the left eye every 4 (four) hours as needed for irritation. 08/25/16   Olivia Canter Sam, PA-C  naproxen (NAPROSYN) 500 MG tablet Take 1 tablet (500 mg total) by mouth 2 (two) times daily as needed for mild pain or moderate pain. 11/30/16   Clayton Bibles, PA-C  ondansetron (ZOFRAN) 4 MG tablet Take 1 tablet (4 mg total) by mouth every 6 (six) hours. Patient not taking: Reported on 06/30/2016 04/14/16   Okey Regal, PA-C  promethazine (PHENERGAN) 25 MG  tablet Take 1 tablet (25 mg total) by mouth every 6 (six) hours as needed for nausea or vomiting. 06/30/16   Orpah Greek, MD  TRUEPLUS LANCETS 28G MISC 1 each by Does not apply route 3 (three) times daily before meals. Patient not taking: Reported on 06/30/2016 11/01/15   Arnoldo Morale, MD    Family History Family History  Problem Relation Age of Onset  . Diabetes Mother   . Hypertension Mother   . Dementia Mother   . Diabetes Father   . Hypertension Father     Social History Social History  Substance Use Topics  . Smoking status: Former Smoker    Quit date: 01/25/2007  . Smokeless tobacco: Never Used  . Alcohol use No     Allergies   Lobster [shellfish allergy]   Review of Systems Review of Systems  Constitutional: Positive for fever. Negative for chills.       Subjective fever  HENT: Positive for congestion, ear pain, postnasal drip and sore throat. Negative for ear discharge, facial swelling, hearing loss, trouble swallowing and voice change.   Eyes: Negative for pain and visual disturbance.  Respiratory: Negative for cough, shortness of breath, wheezing and stridor.   Cardiovascular: Negative for chest pain and palpitations.  Gastrointestinal: Negative for abdominal distention, abdominal pain, blood in stool, nausea and vomiting.  Genitourinary: Negative for difficulty urinating, dysuria, flank pain and hematuria.  Musculoskeletal: Negative for arthralgias, back pain, gait problem, joint swelling, myalgias, neck pain and neck stiffness.  Skin: Negative for color change, pallor and rash.  Neurological: Positive for headaches. Negative for seizures, syncope and weakness.  All other systems reviewed and are negative.    Physical Exam Updated Vital Signs BP 128/87 (BP Location: Left Arm)   Pulse 97   Temp 99 F (37.2 C) (Oral)   Resp 18   Ht 5\' 7"  (1.702 m)   Wt 99.3 kg   SpO2 98%   BMI 34.30 kg/m   Physical Exam  Constitutional: She appears  well-developed and well-nourished. No distress.  Patient is afebrile, nontoxic-appearing, sitting comfortably in the chair in no acute distress.  HENT:  Head: Normocephalic and atraumatic.  Right Ear: External ear normal.  Left Ear: External ear normal.  Mouth/Throat: Oropharynx is clear and moist. No oropharyngeal exudate.  Eyes: Conjunctivae and EOM are normal. Right eye exhibits no discharge. Left eye exhibits no discharge. No scleral icterus.  Neck: Normal range of motion. Neck supple.  Cardiovascular: Normal rate, regular rhythm and normal heart sounds.   No murmur heard. Pulmonary/Chest: Effort normal and breath sounds normal. No respiratory distress. She has no wheezes. She has no rales. She exhibits no tenderness.  Abdominal: Soft. Bowel sounds are normal. She exhibits no distension and no mass. There is no tenderness. There is no rebound and no guarding.  Musculoskeletal: She exhibits no  edema.  Neurological: She is alert.  Skin: Skin is warm and dry. No rash noted. She is not diaphoretic. No erythema. No pallor.  Psychiatric: She has a normal mood and affect.  Nursing note and vitals reviewed.    ED Treatments / Results  Labs (all labs ordered are listed, but only abnormal results are displayed) Labs Reviewed  RAPID STREP SCREEN (NOT AT [facility])  CULTURE, GROUP A STREP [facility])    EKG  EKG Interpretation None       Radiology No results found.  Procedures Procedures (including critical care time)  Medications Ordered in ED Medications - No data to display   Initial Impression / Assessment and Plan / ED Course  I have reviewed the triage vital signs and the nursing notes.  Pertinent labs & imaging results that were available during my care of the patient were reviewed by me and considered in my medical decision making (see chart for details).     41 year old female presenting with sore throat and ear fullness. She reports resolved flulike symptoms about 4 days  ago. She improved for a couple days and then started to feel sore throat and ear fullness.  Reassuring exam, tympanic membranes do show mild effusion. No bulging or erythema. Lungs are clear and equal bilaterally good air movement no wheezing or abnormal sounds. Oropharynx is clear without exudate. She is afebrile with no antipyretic use. Patient is well-appearing and nontoxic. Rapid strep negative  Discharge home with antihistamine, nasal spray and close PCP follow-up. Patient was provided with a list of primary care providers in the area. Patient requested a work note since she works in [employer].  Discussed strict return precautions. Patient was advised to return to the emergency department if experiencing any worsening of symptoms or new concerning symptoms including, shortness of breath, chest pain, fever, chills, nausea, vomiting or any other symptoms.Patient understood instructions and agreed with discharge plan.  Final Clinical Impressions(s) / ED Diagnoses   Final diagnoses:  Sore throat    New Prescriptions New Prescriptions   CETIRIZINE (ZYRTEC) 10 MG TABLET    Take 1 tablet (10 mg total) by mouth daily.   FLUTICASONE (FLONASE) 50 MCG/ACT NASAL SPRAY    Place 1 spray into both nostrils daily.     Emeline General, PA-C 12/30/16 [place], MD 12/31/16 [phone]

## 2016-12-30 NOTE — ED Notes (Signed)
PT DISCHARGED. INSTRUCTIONS AND PRESCRIPTIONS GIVEN. AAOX4. PT IN NO APPARENT DISTRESS. THE OPPORTUNITY TO ASK QUESTIONS WAS PROVIDED. 

## 2017-01-03 ENCOUNTER — Ambulatory Visit (HOSPITAL_COMMUNITY)
Admission: EM | Admit: 2017-01-03 | Discharge: 2017-01-03 | Disposition: A | Discharge status: Home / Self Care | Payer: Self-pay | Attending: Emergency Medicine | Admitting: Emergency Medicine

## 2017-01-03 ENCOUNTER — Encounter (HOSPITAL_COMMUNITY): Payer: Self-pay | Admitting: Emergency Medicine

## 2017-01-03 DIAGNOSIS — M26622 Arthralgia of left temporomandibular joint: Secondary | ICD-10-CM

## 2017-01-03 DIAGNOSIS — H66002 Acute suppurative otitis media without spontaneous rupture of ear drum, left ear: Secondary | ICD-10-CM

## 2017-01-03 LAB — CULTURE, GROUP A STREP (THRC)

## 2017-01-03 MED ORDER — IBUPROFEN 800 MG PO TABS: 800.0000 mg | ORAL_TABLET | Freq: Three times a day (TID) | ORAL | 0 refills | Status: DC | PRN

## 2017-01-03 MED ORDER — AMOXICILLIN 875 MG PO TABS: 875.0000 mg | ORAL_TABLET | Freq: Two times a day (BID) | ORAL | 0 refills | Status: DC

## 2017-01-03 NOTE — ED Provider Notes (Signed)
HPI  SUBJECTIVE:  Wendy Olson is a 41 y.o. female who presents with acute onset of left ear pain described as sharp, stabbing starting last night. States that she heard a "pop" and reports decreased hearing. She denies a change in her pain. She states that the pain radiates down her jaw. She's had upper respiratory like symptoms with nasal congestion, rhinorrhea, postnasal drip, cough and headache for the past 2 or 3 days. She was seen in the ER for this and thought to have a URI. She denies nausea, vomiting, fevers, diarrhea, facial rash. Symptoms are better with blowing her nose, steam, warm compresses, worse with yawning, opening her mouth, coughing or belching. She reports popping and clicking when she opens and closes her mouth. She denies dental pain. No antibiotics in the past month. No antipyretic in the past 6-8 hours. She does not grind her teeth or chew gum. 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 LMP: 1/15. Denies possibility of being pregnant. PMD: None.    Past Medical History:  Diagnosis Date  . 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 Anxiety   . Asthma   . Diabetes mellitus without complication ([place])    type 2  . Hypertension   . Seasonal allergies     Past Surgical History:  Procedure Laterality Date  . TUBAL LIGATION  2009    Family History  Problem Relation Age of Onset  . Diabetes Mother   . Hypertension Mother   . Dementia Mother   . Diabetes Father   . Hypertension Father     Social History  Substance Use Topics  . Smoking status: Former Smoker    Quit date: 01/25/2007  . Smokeless tobacco: Never Used  . Alcohol use No    No current facility-administered medications for this encounter.   Current Outpatient Prescriptions:  .  diphenoxylate-atropine (LOMOTIL) 2.5-0.025 MG tablet, Take 2 tablets by mouth 4 (four) times daily as needed for diarrhea or loose stools., Disp:  20 tablet, Rfl: 0 .  fluticasone (FLONASE) 50 MCG/ACT nasal spray, Place 1 spray into both nostrils daily., Disp: 16 g, Rfl: 2 .  glipiZIDE (GLUCOTROL) 5 MG tablet, Take 1 tablet (5 mg total) by mouth 2 (two) times daily before a meal., Disp: 60 tablet, Rfl: 3 .  lisinopril (PRINIVIL,ZESTRIL) 5 MG tablet, Take 1 tablet (5 mg total) by mouth daily., Disp: 30 tablet, Rfl: 3 .  metFORMIN (GLUCOPHAGE) 500 MG tablet, 1 tablet (500 mg) by mouth twice daily for one week then 2 tabs (1000 mg) twice daily (Patient taking differently: Take 1,000 mg by mouth 2 (two) times daily. ), Disp: 120 tablet, Rfl: 3 .  ondansetron (ZOFRAN) 4 MG tablet, Take 1 tablet (4 mg total) by mouth every 6 (six) hours., Disp: 12 tablet, Rfl: 0 .  amoxicillin (AMOXIL) 875 MG tablet, Take 1 tablet (875 mg total) by mouth 2 (two) times daily., Disp: 20 tablet, Rfl: 0 .  Blood Glucose Monitoring Suppl (TRUE METRIX METER) DEVI, 1 each by Does not apply route 3 (three) times daily before meals. (Patient not taking: Reported on 06/30/2016), Disp: 1 Device, Rfl: 0 .  butalbital-acetaminophen-caffeine (FIORICET) 50-325-40 MG tablet, Take 1-2 tablets by mouth every 6 (six) hours as needed for headache., Disp: 20 tablet, Rfl: 0 .  cetirizine (ZYRTEC) 10 MG tablet, Take 1 tablet (10 mg total) by mouth daily., Disp: 30 tablet, Rfl: 1 .  Chlorpheniramine Maleate (  ALLERGY PO), Take 1 tablet by mouth daily as needed (allergies.)., Disp: , Rfl:  .  glucose blood (TRUE METRIX BLOOD GLUCOSE TEST) test strip, Use as directed 3 times daily before meals (Patient not taking: Reported on 06/30/2016), Disp: 100 each, Rfl: 12 .  ibuprofen (ADVIL,MOTRIN) 800 MG tablet, Take 1 tablet (800 mg total) by mouth every 8 (eight) hours as needed., Disp: 30 tablet, Rfl: 0 .  naphazoline-pheniramine (NAPHCON-A) 0.025-0.3 % ophthalmic solution, Place 2 drops into the left eye every 4 (four) hours as needed for irritation., Disp: 5 mL, Rfl: 0 .  promethazine (PHENERGAN) 25 MG  tablet, Take 1 tablet (25 mg total) by mouth every 6 (six) hours as needed for nausea or vomiting., Disp: 30 tablet, Rfl: 0 .  TRUEPLUS LANCETS 28G MISC, 1 each by Does not apply route 3 (three) times daily before meals. (Patient not taking: Reported on 06/30/2016), Disp: 100 each, Rfl: 12  Allergies  Allergen Reactions  . Lobster [Shellfish Allergy] Anaphylaxis    Just lobster      ROS  As noted in HPI.   Physical Exam  BP 123/73 (BP Location: Right Arm)   Pulse 90   Temp 98.3 F (36.8 C) (Oral)   Resp 16   LMP 12/10/2016   SpO2 97%   Constitutional: Well developed, well nourished, no acute distress Eyes:  EOMI, conjunctiva normal bilaterally HENT: Normocephalic, atraumatic,mucus membranes moist. L External ear and external ear canal within normal limits. mild pain with traction on L pinna. Positive dull, erythematous bulging left TM. Positive decreased hearing left ear. Positive tenderness over the left TMJ.  No crepitus. No trismus. No tenderness over the mastoid area. Right ear and TM normal. Positive mild nasal congestion. Neck: No cervical lymphadenopathy  Respiratory: Normal inspiratory effort Cardiovascular: Normal rate GI: nondistended skin: No facial rash, skin intact Musculoskeletal: no deformities Neurologic: Alert & oriented x 3, no focal neuro deficits Psychiatric: Speech and behavior appropriate   ED Course   Medications - No data to display  No orders of the defined types were placed in this encounter.   No results found for this or any previous visit (from the past 24 hour(s)). No results found.  ED Clinical Impression  Acute suppurative otitis media of left ear without spontaneous rupture of tympanic membrane, recurrence not specified  Arthralgia of left temporomandibular joint   ED Assessment/Plan  Presentation most consistent with an otitis media secondary to URI. Classifying as severe due to hearing loss and amount of pain. Also has some TMJ  irritation/tenderness, but she states that she does not grind her teeth and that this always happens whenever she gets ear infections so deferring steroids and Flexeril at this point in time. Plan to send home with ibuprofen 800 mg 1 g of Tylenol 3 times a day for pain and swelling. Also Flonase, saline nasal irrigation, Mucinex or Mucinex D for the nasal congestion, amoxicillin 875 mg twice a day for 10 days. Will provide primary care referral. To the ER for any worsening of her symptoms. Discussed MDM, plan and followup with patient.  Patient agrees with plan.   Meds ordered this encounter  Medications  . amoxicillin (AMOXIL) 875 MG tablet    Sig: Take 1 tablet (875 mg total) by mouth 2 (two) times daily.    Dispense:  20 tablet    Refill:  0  . ibuprofen (ADVIL,MOTRIN) 800 MG tablet    Sig: Take 1 tablet (800 mg total) by mouth every 8 (  eight) hours as needed.    Dispense:  30 tablet    Refill:  0    *This clinic note was created using [employer]. Therefore, there may be occasional mistakes despite careful proofreading.  ?   Melynda Ripple, MD 01/03/17 1341

## 2017-01-03 NOTE — Discharge Instructions (Signed)
1 g of Tylenol with 800 mg of ibuprofen 3 times a day. This is an effective combination for pain and swelling. Soft diet for the next few days. This will help your temporomandibular joint. Finish all antibiotics, even if you feel better. Go to the ER if you feel a pop in your ear followed by improvement in your pain, if you start having drainage coming from your ear, if you get worse, or other concerns.  Below is a list of primary care practices who are taking new patients for you to follow-up with. [facility] [address] [place], [place] [address] [phone]  [facility] [phone] [place] [address]  [facility]: [place]  [phone]  [facility]: [place]   [phone]  [facility]: [address] [place]  [phone]  [facility] : [address] [phone]  [facility]: [address] [place] [phone]  [facility]: [place] [phone]  Dr. Blanchie Serve [place]  [phone]  Dr. Benito Mccreedy, [facility]. [place], [place] [address]  [phone]

## 2017-01-03 NOTE — ED Triage Notes (Signed)
Here for left ear pain onset last night associated w/chills  Denies fevers  A&O x4... NAD

## 2017-05-27 ENCOUNTER — Encounter (HOSPITAL_COMMUNITY): Payer: Self-pay | Admitting: Emergency Medicine

## 2017-05-27 ENCOUNTER — Emergency Department (HOSPITAL_COMMUNITY): Payer: PRIVATE HEALTH INSURANCE

## 2017-05-27 DIAGNOSIS — Y999 Unspecified external cause status: Secondary | ICD-10-CM | POA: Insufficient documentation

## 2017-05-27 DIAGNOSIS — Z7984 Long term (current) use of oral hypoglycemic drugs: Secondary | ICD-10-CM | POA: Insufficient documentation

## 2017-05-27 DIAGNOSIS — I10 Essential (primary) hypertension: Secondary | ICD-10-CM | POA: Insufficient documentation

## 2017-05-27 DIAGNOSIS — Y929 Unspecified place or not applicable: Secondary | ICD-10-CM | POA: Insufficient documentation

## 2017-05-27 DIAGNOSIS — Y939 Activity, unspecified: Secondary | ICD-10-CM | POA: Insufficient documentation

## 2017-05-27 DIAGNOSIS — Z87891 Personal history of nicotine dependence: Secondary | ICD-10-CM | POA: Insufficient documentation

## 2017-05-27 DIAGNOSIS — M5441 Lumbago with sciatica, right side: Secondary | ICD-10-CM | POA: Insufficient documentation

## 2017-05-27 DIAGNOSIS — W010XXA Fall on same level from slipping, tripping and stumbling without subsequent striking against object, initial encounter: Secondary | ICD-10-CM | POA: Insufficient documentation

## 2017-05-27 DIAGNOSIS — E119 Type 2 diabetes mellitus without complications: Secondary | ICD-10-CM | POA: Insufficient documentation

## 2017-05-27 NOTE — ED Triage Notes (Signed)
Per EMS, pt from home, fall x 2 days ago, c/o bila hip pain.  No shortening noted.

## 2017-05-28 ENCOUNTER — Emergency Department (HOSPITAL_COMMUNITY)
Admission: EM | Admit: 2017-05-28 | Discharge: 2017-05-28 | Disposition: A | Discharge status: Home / Self Care | Payer: PRIVATE HEALTH INSURANCE | Attending: Emergency Medicine | Admitting: Emergency Medicine

## 2017-05-28 DIAGNOSIS — M5441 Lumbago with sciatica, right side: Secondary | ICD-10-CM

## 2017-05-28 MED ORDER — IBUPROFEN 200 MG PO TABS
600.0000 mg | ORAL_TABLET | Freq: Once | ORAL | Status: AC
Start: 2017-05-28 — End: 2017-05-28
  Administered 2017-05-28: 600 mg via ORAL
  Filled 2017-05-28: qty 3

## 2017-05-28 MED ORDER — METHOCARBAMOL 500 MG PO TABS: 500.0000 mg | ORAL_TABLET | Freq: Three times a day (TID) | ORAL | 0 refills | Status: AC | PRN

## 2017-05-28 MED ORDER — ACETAMINOPHEN 500 MG PO TABS
1000.0000 mg | ORAL_TABLET | Freq: Once | ORAL | Status: AC
  Administered 2017-05-28: 1000 mg via ORAL
  Filled 2017-05-28: qty 2

## 2017-05-28 MED ORDER — PREDNISONE 20 MG PO TABS
60.0000 mg | ORAL_TABLET | Freq: Once | ORAL | Status: AC
  Administered 2017-05-28: 60 mg via ORAL
  Filled 2017-05-28: qty 3

## 2017-05-28 MED ORDER — PREDNISONE 10 MG PO TABS: 40.0000 mg | ORAL_TABLET | Freq: Every day | ORAL | 0 refills | Status: AC

## 2017-05-28 NOTE — ED Notes (Signed)
Pt is alert and oriented x 4 and verbally responsive and states thatshe had a fall 2 days ago and her arms broke her fall but her body was jerk more to the right side of the body and has caused her increased pain to her right hip. Pt reports that pain worsens with ambulation/

## 2017-05-28 NOTE — Discharge Instructions (Signed)
Your x-rays were negative today. I suspect that your back and leg pain are from an inflamed sciatic nerve. We will treat this with prednisone, ibuprofen and Tylenol, muscle relaxers.  Please take 600 mg of ibuprofen +1000 mg of Tylenol every 8 hours, 40 mg of prednisone daily, 500 mg of Robaxin every 8 hours for the next 5 days. Your symptoms should start to improve in the next 5-7 days. Please contact primary care provider for worsening or persistent symptoms.  Return to the ED if you develop worsening, severe back pain associated with nausea, vomiting, abdominal pain, urinary symptoms, bladder/bowel incontinence or retention, fevers, weakness or numbness to extremities.

## 2017-05-28 NOTE — ED Provider Notes (Signed)
[facility] DEPT Provider Note   CSN: [id number] Arrival date & time: 05/27/17  2025     History   Chief Complaint Chief Complaint  Patient presents with  . Hip Pain    right     [name] is a 41 y.o. female presents to the ED with bilateral low back pain worse in the right that radiates to posterior right lower extremity 2 days. Patient reports mechanical fall prior to symptom onset, she reports she tripped over something on the floor and and fell forward landing on her knees, most of her weight landed on her right knee. She denies falling on her buttocks or back, loss of consciousness, head trauma. Patient reports that her bottom of her foot feels "tingly". Aggravating factors include placing weight on her right foot, flexion/extension of her back and sitting down for prolonged periods of time. Ibuprofen provides minimal relief. No previous history of back injuries, surgeries.  No associated saddle anesthesia, bladder/bowel incontinence or retention, focal weakness or numbness, fevers, abdominal pain, urinary symptoms, IVDU.   HPI  Past Medical History:  Diagnosis Date  . 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 Anxiety   . Asthma   . Diabetes mellitus without complication ([place])    type 2  . Hypertension   . Seasonal allergies     Patient Active Problem List   Diagnosis Date Noted  . Sore throat 12/30/2016  . Hypertension 12/14/2015  . Type 2 diabetes mellitus with hyperglycemia, without long-term current use of insulin ([place]) 11/01/2015    Past Surgical History:  Procedure Laterality Date  . TUBAL LIGATION  2009    OB History    No data available       Home Medications    Prior to Admission medications   Medication Sig Start Date End Date Taking? Authorizing Provider  amoxicillin (AMOXIL) 875 MG tablet Take 1 tablet (875 mg total) by mouth 2 (two) times daily. 01/03/17   Melynda Ripple, MD  Blood Glucose Monitoring Suppl (TRUE METRIX METER) DEVI 1 each by Does  not apply route 3 (three) times daily before meals. Patient not taking: Reported on 06/30/2016 11/01/15   Arnoldo Morale, MD  butalbital-acetaminophen-caffeine (FIORICET) 50-325-40 MG tablet Take 1-2 tablets by mouth every 6 (six) hours as needed for headache. 08/01/16 08/01/17  Sam, Olivia Canter, PA-C  cetirizine (ZYRTEC) 10 MG tablet Take 1 tablet (10 mg total) by mouth daily. 12/30/16   Emeline General, PA-C  Chlorpheniramine Maleate (ALLERGY PO) Take 1 tablet by mouth daily as needed (allergies.).    [provider]  diphenoxylate-atropine (LOMOTIL) 2.5-0.025 MG tablet Take 2 tablets by mouth 4 (four) times daily as needed for diarrhea or loose stools. 06/30/16   Orpah Greek, MD  fluticasone (FLONASE) 50 MCG/ACT nasal spray Place 1 spray into both nostrils daily. 12/30/16   Avie Echevaria B, PA-C  glipiZIDE (GLUCOTROL) 5 MG tablet Take 1 tablet (5 mg total) by mouth 2 (two) times daily before a meal. 12/14/15   Arnoldo Morale, MD  glucose blood (TRUE METRIX BLOOD GLUCOSE TEST) test strip Use as directed 3 times daily before meals Patient not taking: Reported on 06/30/2016 12/14/15   Arnoldo Morale, MD  ibuprofen (ADVIL,MOTRIN) 800 MG tablet Take 1 tablet (800 mg total) by mouth every 8 (eight) hours as needed. 01/03/17   Melynda Ripple, MD  lisinopril (PRINIVIL,ZESTRIL) 5 MG tablet Take 1 tablet (5 mg total) by mouth daily. 12/14/15   Arnoldo Morale, MD  metFORMIN (GLUCOPHAGE)  500 MG tablet 1 tablet (500 mg) by mouth twice daily for one week then 2 tabs (1000 mg) twice daily Patient taking differently: Take 1,000 mg by mouth 2 (two) times daily.  12/14/15   Arnoldo Morale, MD  methocarbamol (ROBAXIN) 500 MG tablet Take 1 tablet (500 mg total) by mouth every 8 (eight) hours as needed for muscle spasms. 05/28/17 06/02/17  Kinnie Feil, PA-C  naphazoline-pheniramine (NAPHCON-A) 0.025-0.3 % ophthalmic solution Place 2 drops into the left eye every 4 (four) hours as needed for irritation. 08/25/16    Sam, Olivia Canter, PA-C  ondansetron (ZOFRAN) 4 MG tablet Take 1 tablet (4 mg total) by mouth every 6 (six) hours. 04/14/16   Hedges, Dellis Filbert, PA-C  predniSONE (DELTASONE) 10 MG tablet Take 4 tablets (40 mg total) by mouth daily. 05/28/17 06/02/17  Kinnie Feil, PA-C  promethazine (PHENERGAN) 25 MG tablet Take 1 tablet (25 mg total) by mouth every 6 (six) hours as needed for nausea or vomiting. 06/30/16   Pollina, Gwenyth Allegra, MD  TRUEPLUS LANCETS 28G MISC 1 each by Does not apply route 3 (three) times daily before meals. Patient not taking: Reported on 06/30/2016 11/01/15   Arnoldo Morale, MD    Family History Family History  Problem Relation Age of Onset  . Diabetes Mother   . Hypertension Mother   . Dementia Mother   . Diabetes Father   . Hypertension Father     Social History Social History  Substance Use Topics  . Smoking status: Former Smoker    Quit date: 01/25/2007  . Smokeless tobacco: Never Used  . Alcohol use No     Allergies   Lobster [shellfish allergy]   Review of Systems Review of Systems  Constitutional: Negative for fever.  HENT: Negative for congestion and sore throat.   Respiratory: Negative for cough and shortness of breath.   Cardiovascular: Negative for chest pain.  Gastrointestinal: Negative for abdominal pain, constipation, diarrhea, nausea and vomiting.  Genitourinary: Negative for difficulty urinating and dysuria.  Musculoskeletal: Positive for back pain, gait problem and myalgias.  Skin: Negative for wound.  Neurological: Negative for weakness and numbness.     Physical Exam Updated Vital Signs BP 137/70 (BP Location: Left Arm)   Pulse 81   Temp 98.4 F (36.9 C) (Oral)   Resp 19   Ht 5\' 7"  (1.702 m)   Wt 99.3 kg (219 lb)   LMP 05/20/2017 Comment: tubes tied, pregnancy waiver signed  SpO2 97%   BMI 34.30 kg/m   Physical Exam  Constitutional: She is oriented to person, place, and time. She appears well-developed and well-nourished. No  distress.  HENT:  Head: Normocephalic and atraumatic.  Nose: Nose normal.  Eyes: EOM are normal.  Neck:  Full AROM of cervical spine without pain or rigidity No midline cervical spine tenderness No cervical paraspinal muscle tenderness or increased tone  Cardiovascular: Normal rate, S1 normal, S2 normal and normal heart sounds.   Pulses:      Radial pulses are 2+ on the right side, and 2+ on the left side.       Dorsalis pedis pulses are 2+ on the right side, and 2+ on the left side.  Pulmonary/Chest: Effort normal and breath sounds normal. She has no decreased breath sounds.  Abdominal: Soft. Normal appearance and bowel sounds are normal. There is no tenderness.  No suprapubic or CVA tenderness   Musculoskeletal: She exhibits tenderness.       Lumbar back: She exhibits tenderness  and pain.  Bilateral L spine paraspinal muscular tenderness (R>L) Full AROM of T/L spine, pain with flexion and extension R SI joint and sciatic notch tenderness Full PROM hip bilaterally, pain with R hip flexion, IR/ER  Positive SLR (right).  Positive Corky Sox (right).  No T spine midline tenderness No L spine midline tenderness Negative Stinchfield test.   Neurological: She is alert and oriented to person, place, and time.  5/5 strength with flexion/extension of hip, knee and ankle, bilaterally.  Sensation to light touch intact in lower extremities including feet  Skin: Skin is warm and dry. Capillary refill takes less than 2 seconds.  Psychiatric: She has a normal mood and affect. Her speech is normal and behavior is normal. Judgment and thought content normal. Cognition and memory are normal.  Nursing note and vitals reviewed.    ED Treatments / Results  Labs (all labs ordered are listed, but only abnormal results are displayed) Labs Reviewed - No data to display  EKG  EKG Interpretation None       Radiology Dg Hip Unilat  With Pelvis 2-3 Views Right  Result Date: 05/27/2017 CLINICAL DATA:   Right hip pain after fall 2 days prior, difficulty with weight-bearing. EXAM: DG HIP (WITH OR WITHOUT PELVIS) 2-3V RIGHT COMPARISON:  None. FINDINGS: The cortical margins of the bony pelvis and right hip are intact. No fracture. Pubic symphysis and sacroiliac joints are congruent. Both femoral heads are well-seated in the respective acetabula. IMPRESSION: Negative radiographs of the pelvis and right hip. Electronically Signed   By: Jeb Levering M.D.   On: 05/27/2017 22:05    Procedures Procedures (including critical care time)  Medications Ordered in ED Medications  predniSONE (DELTASONE) tablet 60 mg (not administered)  ibuprofen (ADVIL,MOTRIN) tablet 600 mg (not administered)  acetaminophen (TYLENOL) tablet 1,000 mg (not administered)     Initial Impression / Assessment and Plan / ED Course  I have reviewed the triage vital signs and the nursing notes.  Pertinent labs & imaging results that were available during my care of the patient were reviewed by me and considered in my medical decision making (see chart for details).    Patient is a 41 y.o. female presents to the ED with bilateral low back pain that radiates to posterior right lower extremity 2 days since mechanical fall. Patient fell forward landing on her right knee, no direct lumbar spine trauma.  Positive right SLR and tenderness to right SI joint and sciatic notch. Lower extremity neurological exam intact. No suprapubic tenderness or CVA tenderness.  Initial differential diagnoses include radiculopathy, muscular strain or spasms. No red flag symptoms of back pain including: fecal incontinence, urinary retention or overflow incontinence, night sweats, waking from sleep with back pain, unexplained fevers or weight loss, h/o cancer, IVDU, recent trauma.  Doubt compression fx, pyelonephritis, nephrolithiasis, epidural abscess, AAA, dissection, cauda equina.    Final Clinical Impressions(s) / ED Diagnoses   Final diagnoses:    Acute right-sided low back pain with right-sided sciatica   Triage R hip and pelvis x-ray ordered, negative. Don't think I need specific lumbar spine films as there is no direct lumbar spine trauma, really low suspicion for compression fx. Labs not indicated today as abdominal and MSK exam reassuring and no red flag symptoms present.  Suspect pt experiencing lumbar radiculopathy vs spasms.  Conservative measures indicated with PCP follow-up if no improvement with conservative management. ED return precautions discussed with pt who verbalized understanding and was agreeable to dispo plan.  New Prescriptions New Prescriptions   METHOCARBAMOL (ROBAXIN) 500 MG TABLET    Take 1 tablet (500 mg total) by mouth every 8 (eight) hours as needed for muscle spasms.   PREDNISONE (DELTASONE) 10 MG TABLET    Take 4 tablets (40 mg total) by mouth daily.     Kinnie Feil, PA-C 05/28/17 0258    Randal Buba, April, MD 05/28/17 [phone]

## 2017-07-22 ENCOUNTER — Ambulatory Visit: Payer: Self-pay

## 2017-08-09 IMAGING — CR DG CHEST 2V
2 series · 2 of 2 positions shown · non-contrast
Comparison: 11/17/2006

CLINICAL DATA: Midsternal and left-sided chest pain radiating to
the back. Onset last evening while at work.

EXAM:
CHEST  2 VIEW

[w chest pa]
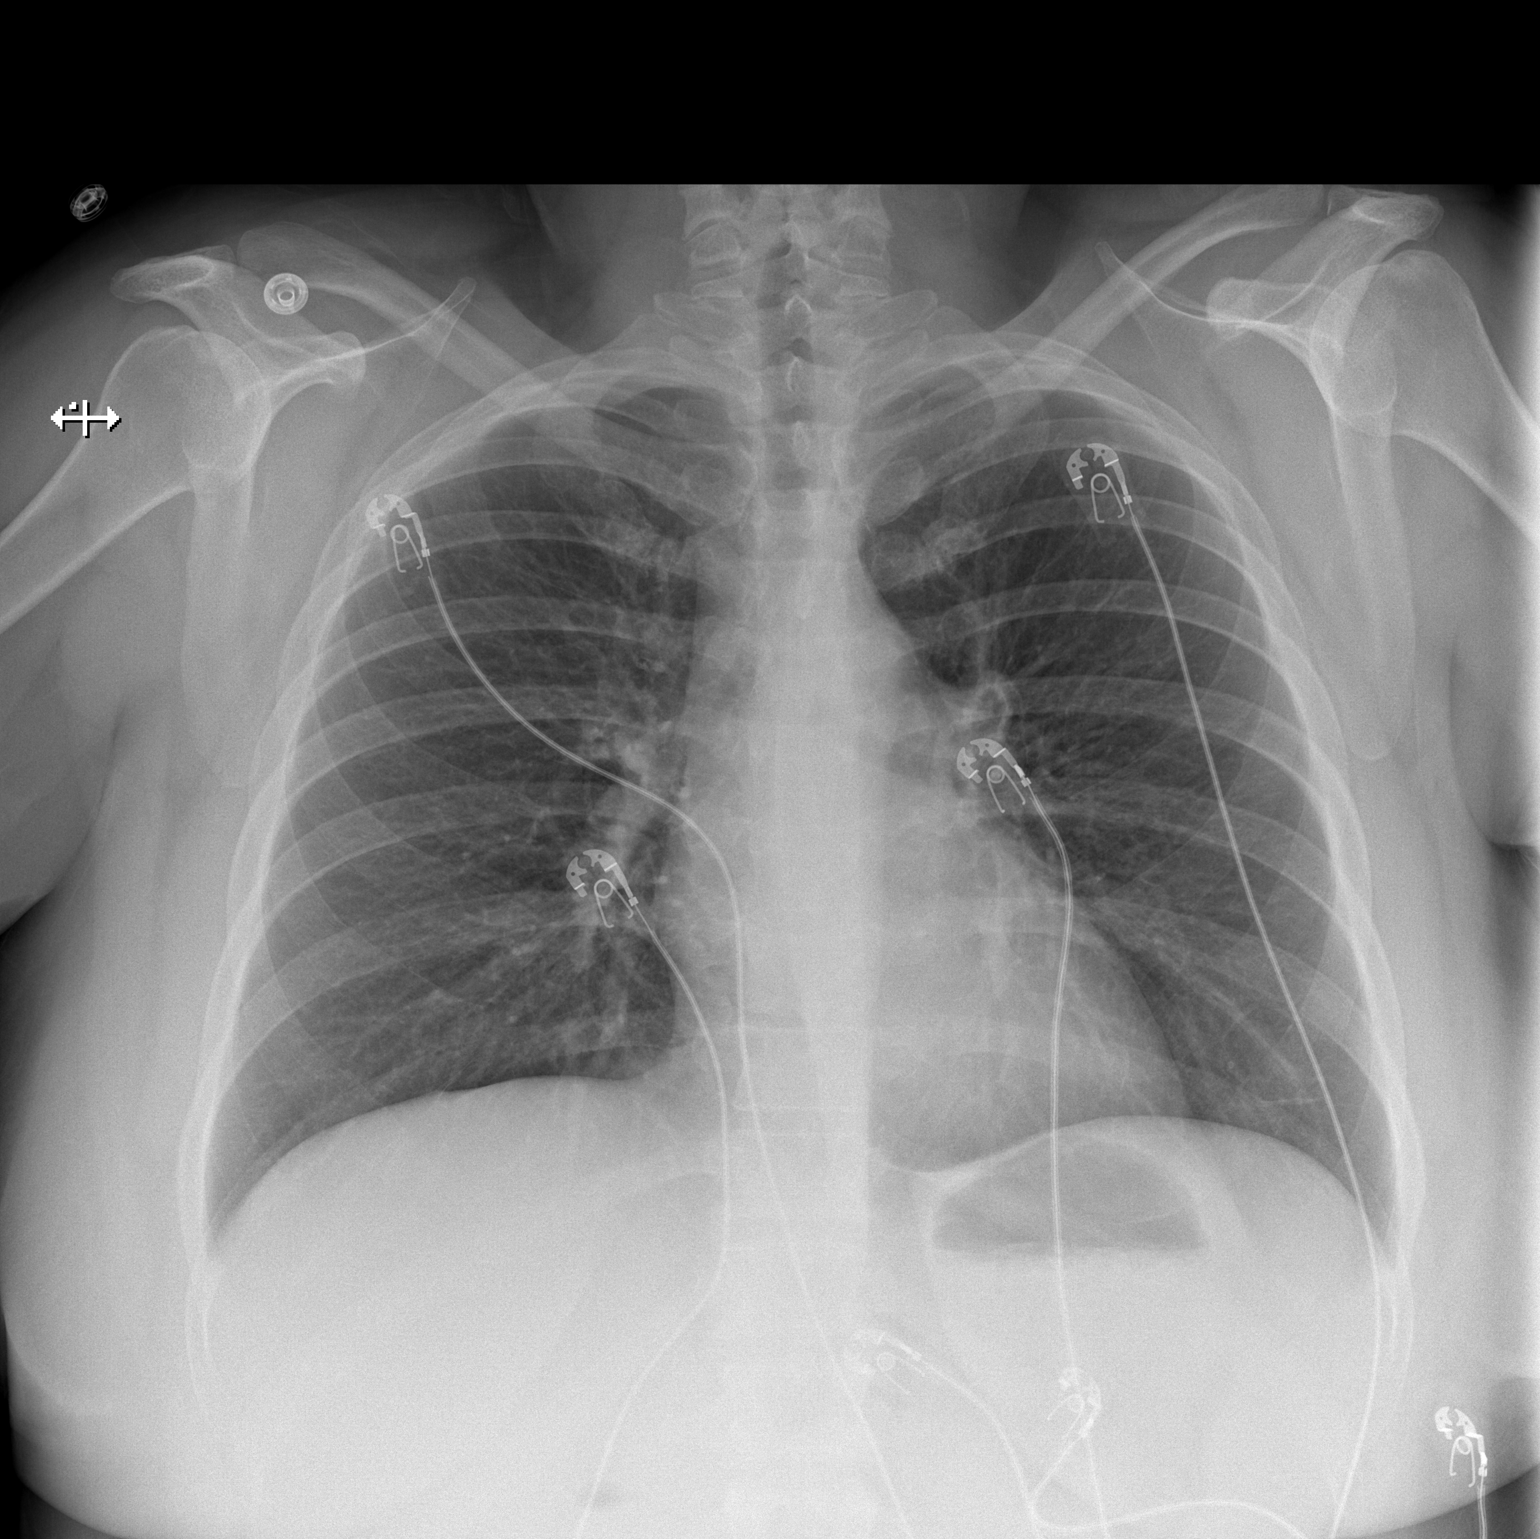

[w chest lat]
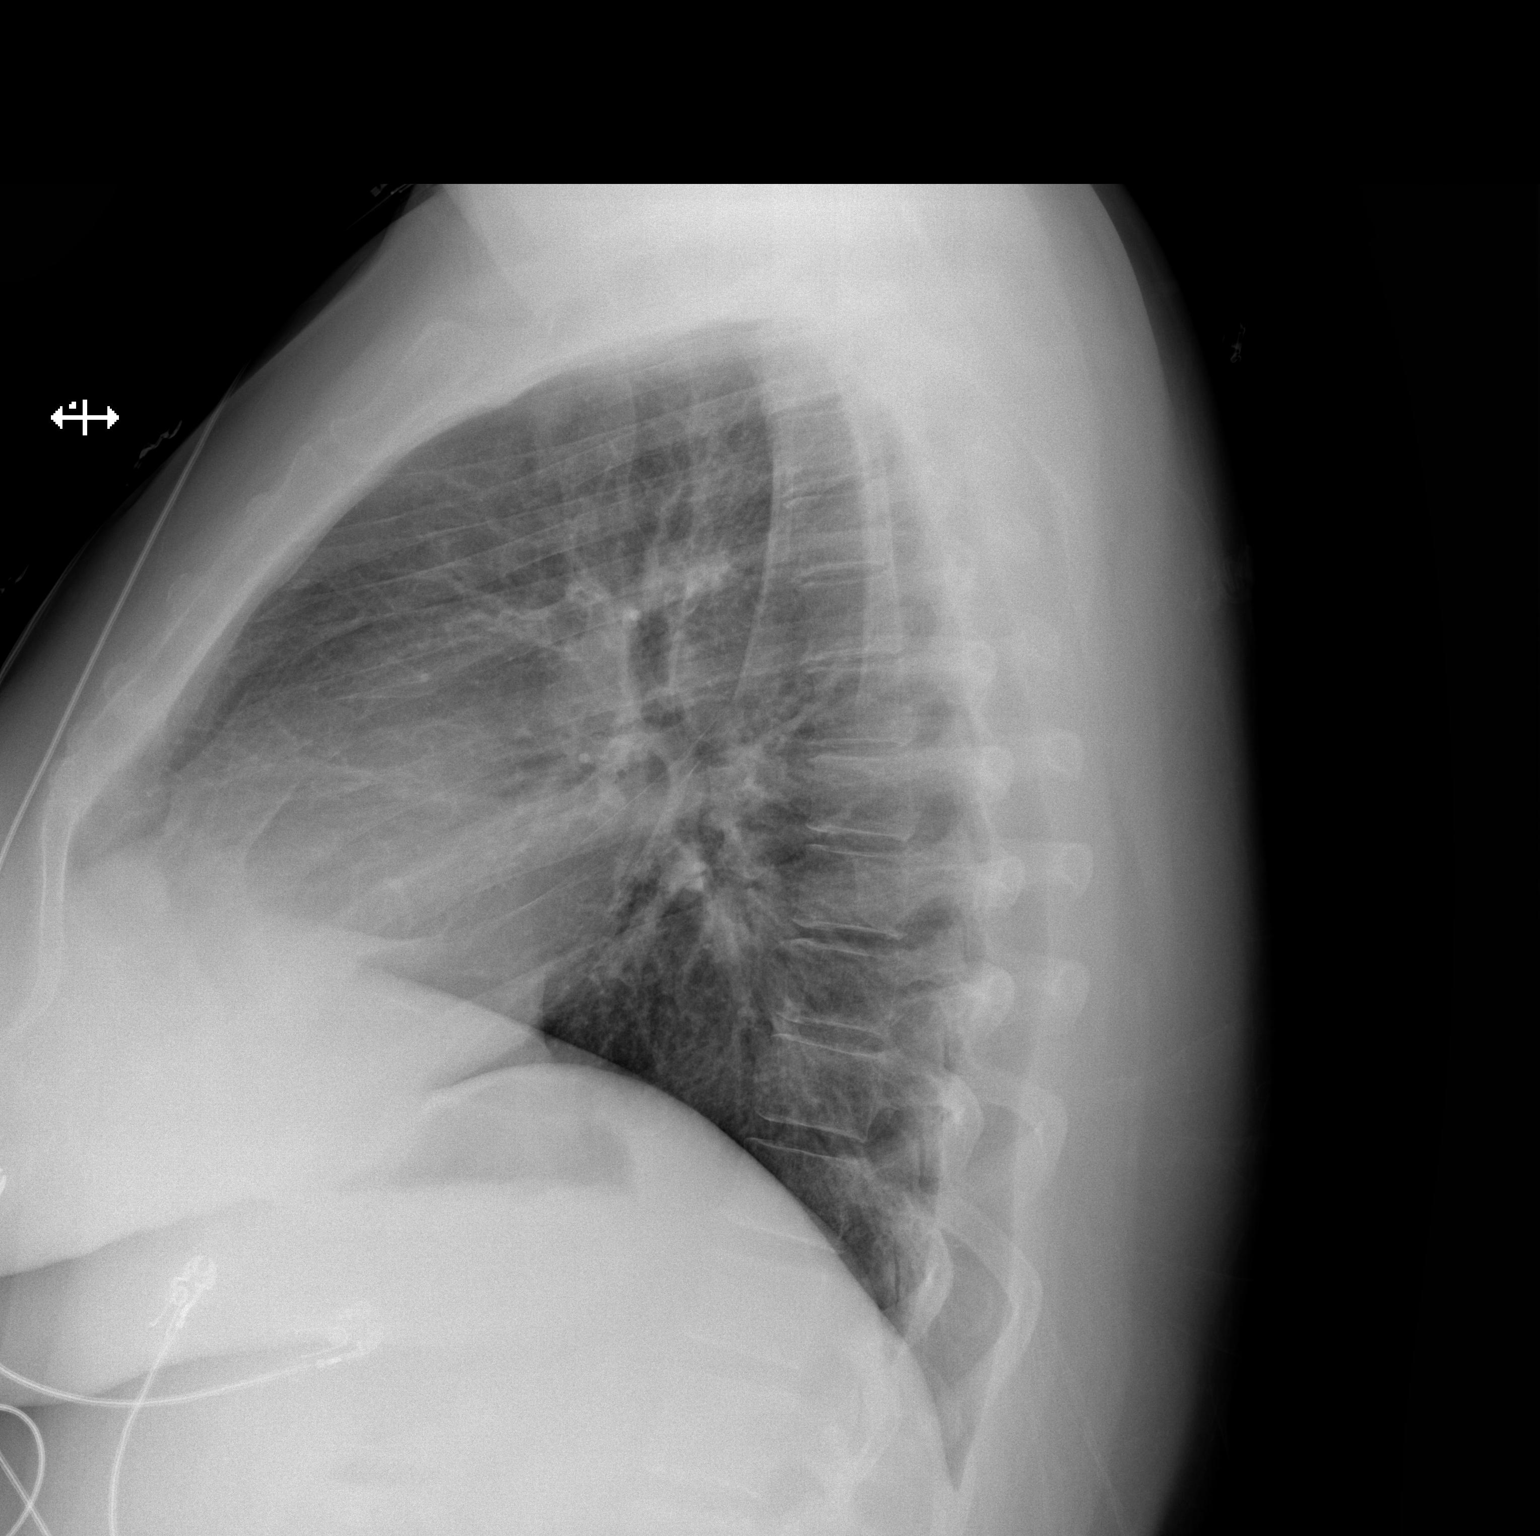

[2 of 2 positions shown; findings below may reference images not displayed]

FINDINGS: The cardiomediastinal contours are normal. Trace subsegmental
atelectasis at the left lung base. Pulmonary vasculature is normal.
No consolidation, pleural effusion, or pneumothorax. No acute
osseous abnormalities are seen.
IMPRESSION: No acute pulmonary process.

## 2017-12-12 ENCOUNTER — Encounter: Payer: Self-pay | Admitting: Family Medicine

## 2017-12-12 ENCOUNTER — Ambulatory Visit: Payer: Self-pay | Attending: Family Medicine | Admitting: Family Medicine

## 2017-12-12 VITALS — BP 154/93 | HR 90 | Temp 98.1°F | Resp 16 | Ht 67.0 in | Wt 215.0 lb

## 2017-12-12 DIAGNOSIS — M5441 Lumbago with sciatica, right side: Secondary | ICD-10-CM | POA: Insufficient documentation

## 2017-12-12 DIAGNOSIS — H539 Unspecified visual disturbance: Secondary | ICD-10-CM | POA: Insufficient documentation

## 2017-12-12 DIAGNOSIS — G8929 Other chronic pain: Secondary | ICD-10-CM | POA: Insufficient documentation

## 2017-12-12 DIAGNOSIS — I252 Old myocardial infarction: Secondary | ICD-10-CM | POA: Insufficient documentation

## 2017-12-12 DIAGNOSIS — H9313 Tinnitus, bilateral: Secondary | ICD-10-CM | POA: Insufficient documentation

## 2017-12-12 DIAGNOSIS — I10 Essential (primary) hypertension: Secondary | ICD-10-CM | POA: Insufficient documentation

## 2017-12-12 DIAGNOSIS — E1165 Type 2 diabetes mellitus with hyperglycemia: Secondary | ICD-10-CM | POA: Insufficient documentation

## 2017-12-12 DIAGNOSIS — H538 Other visual disturbances: Secondary | ICD-10-CM

## 2017-12-12 DIAGNOSIS — R300 Dysuria: Secondary | ICD-10-CM | POA: Insufficient documentation

## 2017-12-12 LAB — POCT URINALYSIS DIPSTICK
BILIRUBIN UA: NEGATIVE
Ketones, UA: NEGATIVE
Leukocytes, UA: NEGATIVE
Nitrite, UA: NEGATIVE
Spec Grav, UA: 1.02 (ref 1.010–1.025)
Urobilinogen, UA: 0.2 E.U./dL
pH, UA: 5.5 (ref 5.0–8.0)

## 2017-12-12 LAB — POCT GLYCOSYLATED HEMOGLOBIN (HGB A1C): Hemoglobin A1C: 10.1

## 2017-12-12 LAB — POCT UA - MICROALBUMIN

## 2017-12-12 LAB — GLUCOSE, POCT (MANUAL RESULT ENTRY): POC Glucose: 304 mg/dl — AB (ref 70–99)

## 2017-12-12 MED ORDER — METFORMIN HCL 1000 MG PO TABS: ORAL_TABLET | ORAL | 2 refills | Status: DC

## 2017-12-12 MED ORDER — IBUPROFEN 600 MG PO TABS: 600.0000 mg | ORAL_TABLET | Freq: Three times a day (TID) | ORAL | 1 refills | Status: DC | PRN

## 2017-12-12 MED ORDER — CYCLOBENZAPRINE HCL 5 MG PO TABS: 5.0000 mg | ORAL_TABLET | Freq: Two times a day (BID) | ORAL | 1 refills | Status: DC | PRN

## 2017-12-12 MED ORDER — TRUE METRIX METER DEVI: 1.0000 | Freq: Three times a day (TID) | 0 refills | Status: DC

## 2017-12-12 MED ORDER — GLIPIZIDE 5 MG PO TABS: 5.0000 mg | ORAL_TABLET | Freq: Every day | ORAL | 2 refills | Status: DC

## 2017-12-12 MED ORDER — METFORMIN HCL 1000 MG PO TABS: 1000.0000 mg | ORAL_TABLET | Freq: Two times a day (BID) | ORAL | 2 refills | Status: DC

## 2017-12-12 MED ORDER — LISINOPRIL 10 MG PO TABS: 10.0000 mg | ORAL_TABLET | Freq: Every day | ORAL | 2 refills | Status: DC

## 2017-12-12 MED ORDER — GLUCOSE BLOOD VI STRP: ORAL_STRIP | 12 refills | Status: DC

## 2017-12-12 MED ORDER — TRUEPLUS LANCETS 28G MISC: 1.0000 | Freq: Three times a day (TID) | 12 refills | Status: DC

## 2017-12-12 MED FILL — ?METFORMIN HCL 1,000 MG TAB: 1000 | 30 days supply | Qty: 60 | Fill #0

## 2017-12-12 MED FILL — LISINOPRIL 10 MG TABS: 10 | 30 days supply | Qty: 30 | Fill #0

## 2017-12-12 MED FILL — TRUEplus LANCETS 28G MISC: 30 days supply | Qty: 100 | Fill #0

## 2017-12-12 MED FILL — ?CYCLOBENZAPRINE 5 MG TABLE: 5 | 15 days supply | Qty: 30 | Fill #0

## 2017-12-12 MED FILL — TRUE METRIX TEST STRIP: 30 days supply | Qty: 100 | Fill #0

## 2017-12-12 MED FILL — ?GLIPIZIDE 5MG TABLET: 5 | 30 days supply | Qty: 30 | Fill #0

## 2017-12-12 MED FILL — IBUPROFEN 600 MG TABLET: 600 | 10 days supply | Qty: 30 | Fill #0

## 2017-12-12 MED FILL — !TRUE METRIX BLOOD GLUCOSE: 365 days supply | Qty: 1 | Fill #0

## 2017-12-12 NOTE — Progress Notes (Signed)
cPatient is here to establish care for diabetes. Patient stated she need medication refills. Patient stated she is having pain and pressure on her abdomen for a week and think it she may have urine infection.

## 2017-12-12 NOTE — Patient Instructions (Signed)
You will be called with your labs results.   Blood Glucose Monitoring, Adult Monitoring your blood sugar (glucose) helps you manage your diabetes. It also helps you and your health care provider determine how well your diabetes management plan is working. Blood glucose monitoring involves checking your blood glucose as often as directed, and keeping a record (log) of your results over time. Why should I monitor my blood glucose? Checking your blood glucose regularly can:  Help you understand how food, exercise, illnesses, and medicines affect your blood glucose.  Let you know what your blood glucose is at any time. You can quickly tell if you are having low blood glucose (hypoglycemia) or high blood glucose (hyperglycemia).  Help you and your health care provider adjust your medicines as needed.  When should I check my blood glucose? Follow instructions from your health care provider about how often to check your blood glucose. This may depend on:  The type of diabetes you have.  How well-controlled your diabetes is.  Medicines you are taking.  If you have type 1 diabetes:  Check your blood glucose at least 2 times a day.  Also check your blood glucose: ? Before every insulin injection. ? Before and after exercise. ? Between meals. ? 2 hours after a meal. ? Occasionally between 2:00 a.m. and 3:00 a.m., as directed. ? Before potentially dangerous tasks, like driving or using heavy machinery. ? At bedtime.  You may need to check your blood glucose more often, up to 6-10 times a day: ? If you use an insulin pump. ? If you need multiple daily injections (MDI). ? If your diabetes is not well-controlled. ? If you are ill. ? If you have a history of severe hypoglycemia. ? If you have a history of not knowing when your blood glucose is getting low (hypoglycemia unawareness). If you have type 2 diabetes:  If you take insulin or other diabetes medicines, check your blood glucose at  least 2 times a day.  If you are on intensive insulin therapy, check your blood glucose at least 4 times a day. Occasionally, you may also need to check between 2:00 a.m. and 3:00 a.m., as directed.  Also check your blood glucose: ? Before and after exercise. ? Before potentially dangerous tasks, like driving or using heavy machinery.  You may need to check your blood glucose more often if: ? Your medicine is being adjusted. ? Your diabetes is not well-controlled. ? You are ill. What is a blood glucose log?  A blood glucose log is a record of your blood glucose readings. It helps you and your health care provider: ? Look for patterns in your blood glucose over time. ? Adjust your diabetes management plan as needed.  Every time you check your blood glucose, write down your result and notes about things that may be affecting your blood glucose, such as your diet and exercise for the day.  Most glucose meters store a record of glucose readings in the meter. Some meters allow you to download your records to a computer. How do I check my blood glucose? Follow these steps to get accurate readings of your blood glucose: Supplies needed   Blood glucose meter.  Test strips for your meter. Each meter has its own strips. You must use the strips that come with your meter.  A needle to prick your finger (lancet). Do not use lancets more than once.  A device that holds the lancet (lancing device).  A journal  or log book to write down your results. Procedure  Wash your hands with soap and water.  Prick the side of your finger (not the tip) with the lancet. Use a different finger each time.  Gently rub the finger until a small drop of blood appears.  Follow instructions that come with your meter for inserting the test strip, applying blood to the strip, and using your blood glucose meter.  Write down your result and any notes. Alternative testing sites  Some meters allow you to use  areas of your body other than your finger (alternative sites) to test your blood.  If you think you may have hypoglycemia, or if you have hypoglycemia unawareness, do not use alternative sites. Use your finger instead.  Alternative sites may not be as accurate as the fingers, because blood flow is slower in these areas. This means that the result you get may be delayed, and it may be different from the result that you would get from your finger.  The most common alternative sites are: ? Forearm. ? Thigh. ? Palm of the hand. Additional tips  Always keep your supplies with you.  If you have questions or need help, all blood glucose meters have a 24-hour "hotline" number that you can call. You may also contact your health care provider.  After you use a few boxes of test strips, adjust (calibrate) your blood glucose meter by following instructions that came with your meter. This information is not intended to replace advice given to you by your health care provider. Make sure you discuss any questions you have with your health care provider. Document Released: 11/15/2003 Document Revised: 06/01/2016 Document Reviewed: 04/23/2016 Elsevier Interactive Patient Education  2017 [employer].

## 2017-12-12 NOTE — Progress Notes (Signed)
Subjective:  Patient ID: Wendy Olson, female    DOB: August 04, 1976  Age: 42 y.o. MRN: [id number]  CC: Establish Care   [name] presents to establish care. PMH of diabetes, hypertension,chronic back pain Symptoms: visual disturbances. Symptoms have gradually worsened last 2 months. Patient denies foot ulcerations, nausea, paresthesia of the feet, polydipsia, visual disturbances and vomitting.  Evaluation to date has been included: fasting blood sugar and hemoglobin A1C.  Home sugars: patient does not check sugars.Treatment to date: no recent interventions. She is not exercising and is not adherent to low salt diet.  She does not check BP at home. Cardiac symptoms none. Patient denies chest pain, claudication, dyspnea and lower extremity edema.  Cardiovascular risk factors: diabetes mellitus, hypertension and sedentary lifestyle. Use of agents associated with hypertension: none. History of target organ damage: angina/ prior myocardial infarction. She c/o roaring sensation in bilateral ears. She is unsure of symptom onset but reports longer than 3 months. History of frequent ear infections I childhood. Denies any history of tympanic tubes. History of chronic back pain: She reports personal history of scoliosis of the spine. Pain is intermittent.  Associated symptoms include tingling of LLE. She denies any history of back injury.  Outpatient Medications Prior to Visit  Medication Sig Dispense Refill  . cetirizine (ZYRTEC) 10 MG tablet Take 1 tablet (10 mg total) by mouth daily. (Patient not taking: Reported on 12/12/2017) 30 tablet 1  . Chlorpheniramine Maleate (ALLERGY PO) Take 1 tablet by mouth daily as needed (allergies.).    Marland Kitchen diphenoxylate-atropine (LOMOTIL) 2.5-0.025 MG tablet Take 2 tablets by mouth 4 (four) times daily as needed for diarrhea or loose stools. (Patient not taking: Reported on 12/12/2017) 20 tablet 0  . fluticasone (FLONASE) 50 MCG/ACT nasal spray Place 1 spray into  both nostrils daily. (Patient not taking: Reported on 12/12/2017) 16 g 2  . naphazoline-pheniramine (NAPHCON-A) 0.025-0.3 % ophthalmic solution Place 2 drops into the left eye every 4 (four) hours as needed for irritation. (Patient not taking: Reported on 12/12/2017) 5 mL 0  . ondansetron (ZOFRAN) 4 MG tablet Take 1 tablet (4 mg total) by mouth every 6 (six) hours. (Patient not taking: Reported on 12/12/2017) 12 tablet 0  . promethazine (PHENERGAN) 25 MG tablet Take 1 tablet (25 mg total) by mouth every 6 (six) hours as needed for nausea or vomiting. (Patient not taking: Reported on 12/12/2017) 30 tablet 0  . amoxicillin (AMOXIL) 875 MG tablet Take 1 tablet (875 mg total) by mouth 2 (two) times daily. 20 tablet 0  . Blood Glucose Monitoring Suppl (TRUE METRIX METER) DEVI 1 each by Does not apply route 3 (three) times daily before meals. (Patient not taking: Reported on 06/30/2016) 1 Device 0  . glipiZIDE (GLUCOTROL) 5 MG tablet Take 1 tablet (5 mg total) by mouth 2 (two) times daily before a meal. (Patient not taking: Reported on 12/12/2017) 60 tablet 3  . glucose blood (TRUE METRIX BLOOD GLUCOSE TEST) test strip Use as directed 3 times daily before meals (Patient not taking: Reported on 06/30/2016) 100 each 12  . ibuprofen (ADVIL,MOTRIN) 800 MG tablet Take 1 tablet (800 mg total) by mouth every 8 (eight) hours as needed. (Patient not taking: Reported on 12/12/2017) 30 tablet 0  . lisinopril (PRINIVIL,ZESTRIL) 5 MG tablet Take 1 tablet (5 mg total) by mouth daily. (Patient not taking: Reported on 12/12/2017) 30 tablet 3  . metFORMIN (GLUCOPHAGE) 500 MG tablet 1 tablet (500 mg) by mouth twice daily for  one week then 2 tabs (1000 mg) twice daily (Patient not taking: Reported on 12/12/2017) 120 tablet 3  . TRUEPLUS LANCETS 28G MISC 1 each by Does not apply route 3 (three) times daily before meals. (Patient not taking: Reported on 06/30/2016) 100 each 12   No facility-administered medications prior to visit.      ROS Review of Systems  Constitutional: Negative.   HENT: Positive for tinnitus.   Eyes: Positive for visual disturbance.  Respiratory: Negative.   Cardiovascular: Negative.   Gastrointestinal: Negative.   Genitourinary: Positive for dysuria.  Musculoskeletal: Positive for back pain.  Skin: Negative.   Neurological: Negative.   Psychiatric/Behavioral: Negative.     Objective:  BP (!) 154/93 (BP Location: Left Arm, Patient Position: Sitting, Cuff Size: Normal)   Pulse 90   Temp 98.1 F (36.7 C) (Oral)   Resp 16   Ht 5\' 7"  (1.702 m)   Wt 215 lb (97.5 kg)   SpO2 98%   BMI 33.67 kg/m   BP/Weight 12/12/2017 4/0/9811 07/27/4781  Systolic BP 956 213 -  Diastolic BP 93 70 -  Wt. (Lbs) 215 - 219  BMI 33.67 34.3 -    Physical Exam  Constitutional: She is oriented to person, place, and time. She appears well-developed and well-nourished.  HENT:  Head: Normocephalic and atraumatic.  Right Ear: External ear normal.  Left Ear: External ear normal.  Nose: Nose normal.  Mouth/Throat: Oropharynx is clear and moist.  Eyes: Conjunctivae are normal. Pupils are equal, round, and reactive to light.  Cardiovascular: Normal rate, regular rhythm, normal heart sounds and intact distal pulses.  Pulmonary/Chest: Effort normal and breath sounds normal.  Abdominal: Soft. Bowel sounds are normal.  Musculoskeletal:       Lumbar back: She exhibits pain.  Neurological: She is alert and oriented to person, place, and time. She has normal reflexes.  Skin: Skin is warm and dry.  Psychiatric: She has a normal mood and affect.  Nursing note and vitals reviewed.  Diabetic Foot Exam - Simple   Simple Foot Form Diabetic Foot exam was performed with the following findings:  Yes 12/12/2017  8:53 AM  Visual Inspection No deformities, no ulcerations, no other skin breakdown bilaterally:  Yes Sensation Testing Intact to touch and monofilament testing bilaterally:  Yes Pulse Check Posterior Tibialis  and Dorsalis pulse intact bilaterally:  Yes Comments      Assessment & Plan:   1. Essential hypertension  - lisinopril (PRINIVIL,ZESTRIL) 10 MG tablet; Take 1 tablet (10 mg total) by mouth daily.  Dispense: 30 tablet; Refill: 2  2. Type 2 diabetes mellitus with hyperglycemia, without long-term current use of insulin (HCC)  - POCT glycosylated hemoglobin (Hb A1C) - POCT UA - Microalbumin - CMP and Liver - Glucose (CBG) - Lipid Panel - Blood Glucose Monitoring Suppl (TRUE METRIX METER) DEVI; 1 each by Does not apply route 3 (three) times daily before meals.  Dispense: 1 Device; Refill: 0 - glucose blood (TRUE METRIX BLOOD GLUCOSE TEST) test strip; Use as directed 3 times daily before meals  Dispense: 100 each; Refill: 12 - metFORMIN (GLUCOPHAGE) 1000 MG tablet; 1 tablet (500 mg) by mouth twice daily for one week then 2 tabs (1000 mg) twice daily  Dispense: 60 tablet; Refill: 2 - TRUEPLUS LANCETS 28G MISC; 1 each by Does not apply route 3 (three) times daily before meals.  Dispense: 100 each; Refill: 12 - glipiZIDE (GLUCOTROL) 5 MG tablet; Take 1 tablet (5 mg total) by mouth daily before  breakfast.  Dispense: 30 tablet; Refill: 2 - Ambulatory referral to Ophthalmology  3. Dysuria  - Urinalysis Dipstick - Urine cytology ancillary only  4. Tinnitus of both ears  - Ambulatory referral to Audiology  5. Chronic bilateral low back pain with right-sided sciatica  - ibuprofen (ADVIL,MOTRIN) 600 MG tablet; Take 1 tablet (600 mg total) by mouth every 8 (eight) hours as needed. Take with food.  Dispense: 30 tablet; Refill: 1 - cyclobenzaprine (FLEXERIL) 5 MG tablet; Take 1 tablet (5 mg total) by mouth 2 (two) times daily as needed for muscle spasms.  Dispense: 30 tablet; Refill: 1  6. Blurred vision, bilateral  - Ambulatory referral to Ophthalmology      Follow-up: Return in about 2 weeks (around 12/26/2017) for DM/HTN.   Alfonse Spruce FNP

## 2017-12-13 LAB — CMP AND LIVER
ALT: 40 IU/L — AB (ref 0–32)
AST: 26 IU/L (ref 0–40)
Albumin: 4.2 g/dL (ref 3.5–5.5)
Alkaline Phosphatase: 186 IU/L — ABNORMAL HIGH (ref 39–117)
BUN: 7 mg/dL (ref 6–24)
Bilirubin Total: 0.4 mg/dL (ref 0.0–1.2)
Bilirubin, Direct: 0.17 mg/dL (ref 0.00–0.40)
CO2: 18 mmol/L — ABNORMAL LOW (ref 20–29)
CREATININE: 0.56 mg/dL — AB (ref 0.57–1.00)
Calcium: 9 mg/dL (ref 8.7–10.2)
Chloride: 101 mmol/L (ref 96–106)
GFR calc Af Amer: 134 mL/min/{1.73_m2} (ref >59)
GFR, EST NON AFRICAN AMERICAN: 116 mL/min/{1.73_m2} (ref >59)
GLUCOSE: 277 mg/dL — AB (ref 65–99)
Potassium: 4 mmol/L (ref 3.5–5.2)
Sodium: 138 mmol/L (ref 134–144)
Total Protein: 6.9 g/dL (ref 6.0–8.5)

## 2017-12-13 LAB — LIPID PANEL
Chol/HDL Ratio: 6.3 ratio — ABNORMAL HIGH (ref 0.0–4.4)
Cholesterol, Total: 284 mg/dL — ABNORMAL HIGH (ref 100–199)
HDL: 45 mg/dL (ref >39)
LDL CALC: 191 mg/dL — AB (ref 0–99)
TRIGLYCERIDES: 240 mg/dL — AB (ref 0–149)
VLDL CHOLESTEROL CAL: 48 mg/dL — AB (ref 5–40)

## 2017-12-16 LAB — URINE CYTOLOGY ANCILLARY ONLY
BACTERIAL VAGINITIS: POSITIVE — AB
Candida vaginitis: NEGATIVE

## 2017-12-17 ENCOUNTER — Other Ambulatory Visit: Payer: Self-pay | Admitting: Family Medicine

## 2017-12-17 DIAGNOSIS — B9689 Other specified bacterial agents as the cause of diseases classified elsewhere: Secondary | ICD-10-CM

## 2017-12-17 DIAGNOSIS — N760 Acute vaginitis: Secondary | ICD-10-CM

## 2017-12-17 DIAGNOSIS — E782 Mixed hyperlipidemia: Secondary | ICD-10-CM

## 2017-12-17 MED ORDER — METRONIDAZOLE 500 MG PO TABS: 500.0000 mg | ORAL_TABLET | Freq: Two times a day (BID) | ORAL | 0 refills | Status: DC

## 2017-12-17 MED ORDER — ATORVASTATIN CALCIUM 20 MG PO TABS: 20.0000 mg | ORAL_TABLET | Freq: Every day | ORAL | 2 refills | Status: DC

## 2017-12-23 ENCOUNTER — Telehealth (INDEPENDENT_AMBULATORY_CARE_PROVIDER_SITE_OTHER): Payer: Self-pay | Admitting: *Deleted

## 2017-12-23 NOTE — Telephone Encounter (Signed)
Patient verified DOB Patient is aware of lipid (prescribed atorvastatin) and liver levels being elevated and needing to decrease saturated fat intake while increasing physical exercise.  Patient is also aware of BV being positive and yeast being negative. Patient states she will pick up antibiotic when she reports to her appointment on Thursday. No further questions at this time.

## 2017-12-23 NOTE — Telephone Encounter (Signed)
-----   Message from Alfonse Spruce, [place] sent at 12/17/2017  1:34 PM EST ----- Lipid levels were elevated. This can increase your risk of heart disease overtime. You will be prescribed atorvastatin.  Liver enzymes slightly decreased. Start eating a diet low in saturated fat. Limit your intake of fried foods, red meats, and whole milk.  Bacterial vaginosis was positive. BV is caused by an overgrowth of germs in the vagina. To reduce your risk of developing BV don't douche, don't use scented soap or sprays, and use protection during sexual intercourse. Yeast is negative.

## 2017-12-26 ENCOUNTER — Ambulatory Visit: Payer: Self-pay | Admitting: Family Medicine

## 2018-01-08 ENCOUNTER — Encounter: Payer: Self-pay | Admitting: Nurse Practitioner

## 2018-01-08 ENCOUNTER — Ambulatory Visit: Payer: PRIVATE HEALTH INSURANCE | Attending: Nurse Practitioner | Admitting: Nurse Practitioner

## 2018-01-08 VITALS — BP 140/94 | HR 104 | Temp 98.8°F | Ht 67.0 in | Wt 217.8 lb

## 2018-01-08 DIAGNOSIS — L600 Ingrowing nail: Secondary | ICD-10-CM | POA: Diagnosis not present

## 2018-01-08 DIAGNOSIS — I10 Essential (primary) hypertension: Secondary | ICD-10-CM | POA: Diagnosis not present

## 2018-01-08 DIAGNOSIS — E782 Mixed hyperlipidemia: Secondary | ICD-10-CM

## 2018-01-08 DIAGNOSIS — E1165 Type 2 diabetes mellitus with hyperglycemia: Secondary | ICD-10-CM

## 2018-01-08 DIAGNOSIS — B9689 Other specified bacterial agents as the cause of diseases classified elsewhere: Secondary | ICD-10-CM

## 2018-01-08 DIAGNOSIS — J45909 Unspecified asthma, uncomplicated: Secondary | ICD-10-CM | POA: Insufficient documentation

## 2018-01-08 DIAGNOSIS — F419 Anxiety disorder, unspecified: Secondary | ICD-10-CM | POA: Insufficient documentation

## 2018-01-08 DIAGNOSIS — Z91013 Allergy to seafood: Secondary | ICD-10-CM | POA: Insufficient documentation

## 2018-01-08 DIAGNOSIS — Z79899 Other long term (current) drug therapy: Secondary | ICD-10-CM | POA: Insufficient documentation

## 2018-01-08 DIAGNOSIS — N760 Acute vaginitis: Secondary | ICD-10-CM | POA: Diagnosis not present

## 2018-01-08 DIAGNOSIS — M549 Dorsalgia, unspecified: Secondary | ICD-10-CM | POA: Insufficient documentation

## 2018-01-08 DIAGNOSIS — R197 Diarrhea, unspecified: Secondary | ICD-10-CM | POA: Diagnosis not present

## 2018-01-08 DIAGNOSIS — Z794 Long term (current) use of insulin: Secondary | ICD-10-CM | POA: Insufficient documentation

## 2018-01-08 DIAGNOSIS — Z7689 Persons encountering health services in other specified circumstances: Secondary | ICD-10-CM | POA: Insufficient documentation

## 2018-01-08 LAB — GLUCOSE, POCT (MANUAL RESULT ENTRY): POC Glucose: 105 mg/dl — AB (ref 70–99)

## 2018-01-08 MED ORDER — GLIMEPIRIDE 2 MG PO TABS: 2.0000 mg | ORAL_TABLET | Freq: Every day | ORAL | 3 refills | Status: DC

## 2018-01-08 MED ORDER — GLUCOSE BLOOD VI STRP: ORAL_STRIP | 12 refills | Status: DC

## 2018-01-08 MED ORDER — LISINOPRIL 20 MG PO TABS: 20.0000 mg | ORAL_TABLET | Freq: Every day | ORAL | 3 refills | Status: DC

## 2018-01-08 MED ORDER — GLIPIZIDE 10 MG PO TABS: 10.0000 mg | ORAL_TABLET | Freq: Every day | ORAL | 3 refills | Status: DC

## 2018-01-08 MED ORDER — DIPHENOXYLATE-ATROPINE 2.5-0.025 MG PO TABS: 2.0000 | ORAL_TABLET | Freq: Four times a day (QID) | ORAL | 1 refills | Status: DC | PRN

## 2018-01-08 MED ORDER — METRONIDAZOLE 500 MG PO TABS: 500.0000 mg | ORAL_TABLET | Freq: Two times a day (BID) | ORAL | 0 refills | Status: DC

## 2018-01-08 MED ORDER — ATORVASTATIN CALCIUM 20 MG PO TABS: 20.0000 mg | ORAL_TABLET | Freq: Every day | ORAL | 2 refills | Status: DC

## 2018-01-08 MED ORDER — CEPHALEXIN 500 MG PO CAPS: 500.0000 mg | ORAL_CAPSULE | Freq: Four times a day (QID) | ORAL | 0 refills | Status: AC

## 2018-01-08 MED FILL — TRUE METRIX TEST STRIP: 30 days supply | Qty: 100 | Fill #0

## 2018-01-08 MED FILL — ?ATORVASTATIN 20MG TABL: 20 | 30 days supply | Qty: 30 | Fill #0

## 2018-01-08 MED FILL — ?GLIPIZIDE 10 MG TABLET: 10 | 30 days supply | Qty: 30 | Fill #0

## 2018-01-08 MED FILL — ?METRONIDAZOLE 500MG TABS: 500 | 7 days supply | Qty: 14 | Fill #0

## 2018-01-08 MED FILL — CEPHALEXIN 500 MG CAPSULE: 500 | 5 days supply | Qty: 20 | Fill #0

## 2018-01-08 MED FILL — LISINOPRIL 20 MG TAB: 20 | 30 days supply | Qty: 30 | Fill #0

## 2018-01-08 NOTE — Progress Notes (Signed)
Assessment & Plan:  [name] was seen today for establish care and back pain.  Diagnoses and all orders for this visit:  Diarrhea, unspecified type -     diphenoxylate-atropine (LOMOTIL) 2.5-0.025 MG tablet; Take 2 tablets by mouth 4 (four) times daily as needed for diarrhea or loose stools. METFORMIN WAS DC'D today   Type 2 diabetes mellitus with hyperglycemia, without long-term current use of insulin (HCC) -     Glucose (CBG) -     glipiZIDE (GLUCOTROL) 10 MG tablet; Take 1 tablet (10 mg total) by mouth daily before breakfast. -     glucose blood (TRUE METRIX BLOOD GLUCOSE TEST) test strip; Use as directed 3 times daily before meals Lantus 10 units at bedtime   Essential hypertension -     lisinopril (PRINIVIL,ZESTRIL) 20 MG tablet; Take 1 tablet (20 mg total) by mouth daily. Continue all antihypertensives as prescribed.  Remember to bring in your blood pressure log with you for your follow up appointment.  DASH/Mediterranean Diets are healthier choices for HTN.    Mixed hyperlipidemia -     atorvastatin (LIPITOR) 20 MG tablet; Take 1 tablet (20 mg total) by mouth daily. Work on a low fat, heart healthy diet and participate in regular aerobic exercise program to control as well by working out at least 150 minutes per week. No fried foods. No junk foods, sodas, sugary drinks, unhealthy snacking, or smoking.   Ingrown left big toenail -     cephALEXin (KEFLEX) 500 MG capsule; Take 1 capsule (500 mg total) by mouth 4 (four) times daily for 5 days. Instructed patient to follow up with podiatry for foot care She was instructed not to cut her own toenails   BV (bacterial vaginosis) -     metroNIDAZOLE (FLAGYL) 500 MG tablet; Take 1 tablet (500 mg total) by mouth 2 (two) times daily.     Patient has been counseled on age-appropriate routine health concerns for screening and prevention. These are reviewed and up-to-date. Referrals have been placed accordingly. Immunizations are  up-to-date or declined.    Subjective:   Chief Complaint  Patient presents with  . Establish Care    Patient is here to establish care for diabetes and hypertension.   . Back Pain    Patient have her usual back pain.    [name] 42 y.o. female presents to office today to establish care. She had an episode of bowel incontinence with diarrhea here in the clinic today. She has soiled her pants as well. Apparently she has been experiencing chronic diarrhea on and off for several years and had not previously discussed this with her previous provider. Unfortunately she was prescribed the maximum dose of metformin which has only worsened her diarrhea.   Left Toe Pain She has in ingrown toenail on the left hallux. She cuts her own toes and states she cut her toenail down too low. She has complaints of pain, redness and swelling.   Diarrhea Patient complains of diarrhea. Onset of diarrhea was several years ago. Diarrhea is occurring approximately a few times per day. Patient describes diarrhea as watery and uncontrollable. Diarrhea has been associated with abdominal pain described as aching, stabbing a few hours before the onset of the diarrhea.  Patient denies blood in stool, fever, recent antibiotic use, recent travel or unintentional weight loss.  Previous visits for diarrhea: none in this office. Evaluation to date: none. Treatment to date: NONE. She does endorse 6/10 abdominal pain a few  hours before a bowel movement. The pain goes away sometimes after she has a bowel movement. The stools are yellowish in color.    Diabetes Mellitus Chronic. She was diagnosed with diabetes about 2.5 years ago. She is checking her blood sugars at home twice a day. 1 hour before she eats and 4 hours after she eats. Blood sugars averaging 100-160. She denies any hypo or hyperglycemia symptoms. She is not current on her eye exam.  Lab Results  Component Value Date   HGBA1C 10.1 12/12/2017    Essential  Hypertension Chronic. Blood pressure poorly controlled on lisinopril 10mg . Will increase to 20mg  today. She denies chest pain, shortness of breath, palpitations, lightheadedness, dizziness, headaches or BLE edema. She endorses medication compliance. BP Readings from Last 3 Encounters:  01/08/18 (!) 140/94  12/12/17 (!) 154/93  05/28/17 137/70    Hyperlipidemia Poorly controlled. She was just recently started on atorvastatin. Will recheck lipids in 6 months. She endorses medication compliance. Denies statin intolerance.  Lab Results  Component Value Date   CHOL 284 (H) 12/12/2017   HDL 45 12/12/2017   LDLCALC 191 (H) 12/12/2017   TRIG 240 (H) 12/12/2017   CHOLHDL 6.3 (H) 12/12/2017    Review of Systems  Constitutional: Negative for fever, malaise/fatigue and weight loss.  HENT: Negative.  Negative for nosebleeds.   Eyes: Negative.  Negative for blurred vision, double vision and photophobia.  Respiratory: Negative.  Negative for cough and shortness of breath.   Cardiovascular: Negative.  Negative for chest pain, palpitations and leg swelling.  Gastrointestinal: Positive for abdominal pain and diarrhea. Negative for blood in stool, constipation, heartburn, melena, nausea and vomiting.  Genitourinary: Negative.   Musculoskeletal: Negative.  Negative for myalgias.       SEE HPI  Neurological: Negative.  Negative for dizziness, focal weakness, seizures and headaches.  Endo/Heme/Allergies: Negative for environmental allergies.  Psychiatric/Behavioral: Negative.  Negative for suicidal ideas.    Past Medical History:  Diagnosis Date  . 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 Anxiety   . Asthma   . Diabetes mellitus without complication ([place])    type 2  . Hypertension   . Seasonal allergies     Past Surgical History:  Procedure Laterality Date  . TUBAL LIGATION  2009    Family History  Problem Relation Age of Onset  . Diabetes Mother   . Hypertension Mother   . Dementia Mother   .  Diabetes Father   . Hypertension Father     Social History Reviewed with no changes to be made today.   Outpatient Medications Prior to Visit  Medication Sig Dispense Refill  . Blood Glucose Monitoring Suppl (TRUE METRIX METER) DEVI 1 each by Does not apply route 3 (three) times daily before meals. 1 Device 0  . cyclobenzaprine (FLEXERIL) 5 MG tablet Take 1 tablet (5 mg total) by mouth 2 (two) times daily as needed for muscle spasms. 30 tablet 1  . ibuprofen (ADVIL,MOTRIN) 600 MG tablet Take 1 tablet (600 mg total) by mouth every 8 (eight) hours as needed. Take with food. 30 tablet 1  . TRUEPLUS LANCETS 28G MISC 1 each by Does not apply route 3 (three) times daily before meals. 100 each 12  . atorvastatin (LIPITOR) 20 MG tablet Take 1 tablet (20 mg total) by mouth daily. 30 tablet 2  . glucose blood (TRUE METRIX BLOOD GLUCOSE TEST) test strip Use as directed 3 times daily before meals 100 each 12  . lisinopril (PRINIVIL,ZESTRIL) 10 MG  tablet Take 1 tablet (10 mg total) by mouth daily. 30 tablet 2  . metFORMIN (GLUCOPHAGE) 1000 MG tablet Take 1 tablet (1,000 mg total) by mouth 2 (two) times daily with a meal. 60 tablet 2  . cetirizine (ZYRTEC) 10 MG tablet Take 1 tablet (10 mg total) by mouth daily. (Patient not taking: Reported on 12/12/2017) 30 tablet 1  . Chlorpheniramine Maleate (ALLERGY PO) Take 1 tablet by mouth daily as needed (allergies.).    Marland Kitchen fluticasone (FLONASE) 50 MCG/ACT nasal spray Place 1 spray into both nostrils daily. (Patient not taking: Reported on 12/12/2017) 16 g 2  . naphazoline-pheniramine (NAPHCON-A) 0.025-0.3 % ophthalmic solution Place 2 drops into the left eye every 4 (four) hours as needed for irritation. (Patient not taking: Reported on 12/12/2017) 5 mL 0  . ondansetron (ZOFRAN) 4 MG tablet Take 1 tablet (4 mg total) by mouth every 6 (six) hours. (Patient not taking: Reported on 12/12/2017) 12 tablet 0  . promethazine (PHENERGAN) 25 MG tablet Take 1 tablet (25 mg total)  by mouth every 6 (six) hours as needed for nausea or vomiting. (Patient not taking: Reported on 12/12/2017) 30 tablet 0  . diphenoxylate-atropine (LOMOTIL) 2.5-0.025 MG tablet Take 2 tablets by mouth 4 (four) times daily as needed for diarrhea or loose stools. (Patient not taking: Reported on 12/12/2017) 20 tablet 0  . glipiZIDE (GLUCOTROL) 5 MG tablet Take 1 tablet (5 mg total) by mouth daily before breakfast. (Patient not taking: Reported on 01/08/2018) 30 tablet 2  . metroNIDAZOLE (FLAGYL) 500 MG tablet Take 1 tablet (500 mg total) by mouth 2 (two) times daily. (Patient not taking: Reported on 01/08/2018) 14 tablet 0   No facility-administered medications prior to visit.     Allergies  Allergen Reactions  . Lobster [Shellfish Allergy] Anaphylaxis    Just lobster        Objective:    BP (!) 140/94 (BP Location: Right Arm, Patient Position: Sitting, Cuff Size: Large)   Pulse (!) 104   Temp 98.8 F (37.1 C) (Oral)   Ht 5\' 7"  (1.702 m)   Wt 217 lb 12.8 oz (98.8 kg)   SpO2 96%   BMI 34.11 kg/m  Wt Readings from Last 3 Encounters:  01/08/18 217 lb 12.8 oz (98.8 kg)  12/12/17 215 lb (97.5 kg)  05/27/17 219 lb (99.3 kg)    Physical Exam  Constitutional: She is oriented to person, place, and time. She appears well-developed and well-nourished. She is cooperative.  HENT:  Head: Normocephalic and atraumatic.  Eyes: EOM are normal.  Neck: Normal range of motion.  Cardiovascular: Normal rate, regular rhythm, normal heart sounds and intact distal pulses. Exam reveals no gallop and no friction rub.  No murmur heard. Pulmonary/Chest: Effort normal and breath sounds normal. No tachypnea. No respiratory distress. She has no decreased breath sounds. She has no wheezes. She has no rhonchi. She has no rales. She exhibits no tenderness.  Abdominal: Soft. Bowel sounds are normal. She exhibits no distension and no mass. There is no tenderness. There is no rebound and no guarding.  Musculoskeletal:  Normal range of motion. She exhibits no edema.       Feet:  Neurological: She is alert and oriented to person, place, and time. Coordination normal.  Skin: Skin is warm and dry.  Psychiatric: She has a normal mood and affect. Her behavior is normal. Judgment and thought content normal.  Nursing note and vitals reviewed.      Patient has been counseled extensively  about nutrition and exercise as well as the importance of adherence with medications and regular follow-up. The patient was given clear instructions to go to ER or return to medical center if symptoms don't improve, worsen or new problems develop. The patient verbalized understanding.   Follow-up: Return in about 2 weeks (around 01/22/2018) for BP recheck .   Gildardo Pounds, FNP-BC [facility] [place], [place]   01/12/2018, 8:30 PM

## 2018-01-08 NOTE — Patient Instructions (Addendum)
Diarrhea, Adult Diarrhea is frequent loose and watery bowel movements. Diarrhea can make you feel weak and cause you to become dehydrated. Dehydration can make you tired and thirsty, cause you to have a dry mouth, and decrease how often you urinate. Diarrhea typically lasts 2-3 days. However, it can last longer if it is a sign of something more serious. It is important to treat your diarrhea as told by your health care provider. Follow these instructions at home: Eating and drinking  Follow these recommendations as told by your health care provider:  Take an oral rehydration solution (ORS). This is a drink that is sold at pharmacies and retail stores.  Drink clear fluids, such as water, ice chips, diluted fruit juice, and low-calorie sports drinks.  Eat bland, easy-to-digest foods in small amounts as you are able. These foods include bananas, applesauce, rice, lean meats, toast, and crackers.  Avoid drinking fluids that contain a lot of sugar or caffeine, such as energy drinks, sports drinks, and soda.  Avoid alcohol.  Avoid spicy or fatty foods.  General instructions  Drink enough fluid to keep your urine clear or pale yellow.  Wash your hands often. If soap and water are not available, use hand sanitizer.  Make sure that all people in your household wash their hands well and often.  Take over-the-counter and prescription medicines only as told by your health care provider.  Rest at home while you recover.  Watch your condition for any changes.  Take a warm bath to relieve any burning or pain from frequent diarrhea episodes.  Keep all follow-up visits as told by your health care provider. This is important. Contact a health care provider if:  You have a fever.  Your diarrhea gets worse.  You have new symptoms.  You cannot keep fluids down.  You feel light-headed or dizzy.  You have a headache  You have muscle cramps. Get help right away if:  You have chest  pain.  You feel extremely weak or you faint.  You have bloody or black stools or stools that look like tar.  You have severe pain, cramping, or bloating in your abdomen.  You have trouble breathing or you are breathing very quickly.  Your heart is beating very quickly.  Your skin feels cold and clammy.  You feel confused.  You have signs of dehydration, such as: ? Dark urine, very little urine, or no urine. ? Cracked lips. ? Dry mouth. ? Sunken eyes. ? Sleepiness. ? Weakness. This information is not intended to replace advice given to you by your health care provider. Make sure you discuss any questions you have with your health care provider. Document Released: 11/02/2002 Document Revised: 03/22/2016 Document Reviewed: 07/19/2015 Elsevier Interactive Patient Education  2018 [place].  Fecal Incontinence Fecal incontinence, also called accidental bowel leakage, is not being able to control your bowels. This condition happens because the nerves or muscles around the anus do not work the way they should. This affects their ability to hold stool. What are the causes? This condition may be caused by:  Damage to the muscles at the end of the rectum (sphincter).  Damage to the nerves that control bowel movements.  Diarrhea.  Chronic constipation.  Pelvic floor dysfunction. This means the muscles in the pelvis do not work well.  Loss of bowel storage capacity.  What increases the risk? This condition is more likely to develop in people who:  Are born with bowels or a pelvis that has  not formed correctly.  Have had rectal surgery.  Have had radiation treatment for certain cancers.  Have irritable bowel syndrome (IBS).  Have an inflammatory bowel disease (IBD), such as Crohn disease.  Have been pregnant, had a vaginal delivery, or had surgery that damaged the pelvic floor muscles.  Have a complicated childbirth, spinal cord injury, or other trauma that causes  nerve damage.  Have a condition that can affect nerve function, such as diabetes, Parkinson disease, or multiple sclerosis.  Have a condition where the rectum drops down into the anus or vagina (prolapse).  Are older.  What are the signs or symptoms? The main symptom of this condition is not being able to control your bowels. You also might not be able get to the bathroom before a bowel movement. How is this diagnosed? This condition is diagnosed with a medical history and physical exam. You may also have tests, including:  Blood tests.  Urine tests.  A rectal exam.  Ultrasound.  MRI.  Colonoscopy. This is an exam that looks at your large intestine (colon).  Anal manometry. This is a test that measures the strength of the anal sphincter.  Anal electromyogram (EMG). This is a test that uses small electrodes to check for nerve damage.  How is this treated? Treatment varies depending on the cause and severity of your condition. Treatment may also focus on addressing any underlying causes of this condition. Treatment may include:  Medicines. This may include medicines to: ? Prevent diarrhea. ? Help with constipation (laxatives). ? Treat any underlying conditions.  Physical therapy.  Fiber supplements. These can help manage your bowel movements.  Nerve stimulation.  Injectable gel to promote tissue growth and better muscle control.  Surgery. You may need: ? Sphincter repair surgery. ? Diversion surgery. This procedure lets feces pass out of your body through a hole in your abdomen.  Follow these instructions at home: Diet  Follow instructions from your health care provider about any eating or drinking restrictions. Work with a dietitian to come up with a healthy diet and to help you avoid the foods that can make your condition worse. Keep a diet diary to find out which foods or drinks could be making your fecal incontinence worse.  Drink enough fluid to keep your  urine clear or pale yellow. Lifestyle  If you smoke, talk to your health care provider about quitting. This may help your condition.  If you are overweight, talk to your health care provider about how to safely lose weight. This may help your condition.  Increase your physical activity as told by your health care provider. This may help your condition. Always talk to your health care provider before starting a new exercise program.  Carry a change of clothes and supplies to clean up quickly if you have an episode of fetal incontinence.  Consider joining a fecal incontinence support group. You can find a support group online or in your local community. General instructions  Take over-the-counter and prescription medicines only as told by your health care provider. This includes any supplements.  Apply a moisture barrier, such as petroleum jelly, to your rectum. This protects the skin from irritation caused by ongoing leaking or diarrhea.  Tell your health care provider if you are upset or depressed about your condition. Where to find more information: American Academy of [employer] Physicians: www.AromatherapyParty.no International Foundation for Functional Gastrointestinal Disorders: www.iffgd.org Contact a health care provider if:  You have a fever.  You have redness, swelling,  or pain around your rectum.  Your pain is getting worse or you lose feeling in your rectal area.  You have blood in your stool.  You feel sad or hopeless.  You avoid social or work situations. Get help right away if:  You stop having bowel movements.  You cannot eat or drink without vomiting.  You have rectal bleeding that does not stop.  You have severe pain that is getting worse.  You have symptoms of dehydration, including: ? Sleepiness or fatigue. ? Producing little or no urine, tears, or sweat. ? Dizziness. ? Dry mouth. ? Unusual irritability. ? Headache. ? Inability to think clearly. This  information is not intended to replace advice given to you by your health care provider. Make sure you discuss any questions you have with your health care provider. Document Released: 10/24/2004 Document Revised: 04/19/2016 Document Reviewed: 04/20/2015 Elsevier Interactive Patient Education  [employer].

## 2018-01-12 ENCOUNTER — Encounter: Payer: Self-pay | Admitting: Nurse Practitioner

## 2018-01-12 MED ORDER — INSULIN PEN NEEDLE 31G X 5 MM MISC: 1 refills | Status: DC

## 2018-01-12 MED ORDER — INSULIN GLARGINE 100 UNIT/ML SOLOSTAR PEN
10.0000 [IU] | PEN_INJECTOR | Freq: Every day | SUBCUTANEOUS | 6 refills | Status: DC
Start: 2018-01-12 — End: 2018-02-25

## 2018-01-22 ENCOUNTER — Ambulatory Visit: Payer: Self-pay | Admitting: Pharmacist

## 2018-01-22 NOTE — Progress Notes (Deleted)
   S:    Patient arrives ***.    Presents to the clinic for hypertension evaluation.   Patient {Actions; denies-reports:120008} adherence with medications.  Current BP Medications include:  Lisinopril 20 mg daily  Antihypertensives tried in the past include: ***  Dietary habits include:  .[email or web address]   O:   Last 3 Office BP readings: BP Readings from Last 3 Encounters:  01/08/18 (!) 140/94  12/12/17 (!) 154/93  05/28/17 137/70    BMET    Component Value Date/Time   NA 138 12/12/2017 0936   K 4.0 12/12/2017 0936   CL 101 12/12/2017 0936   CO2 18 (L) 12/12/2017 0936   GLUCOSE 277 (H) 12/12/2017 0936   GLUCOSE 351 (H) 06/30/2016 0255   BUN 7 12/12/2017 0936   CREATININE 0.56 (L) 12/12/2017 0936   CALCIUM 9.0 12/12/2017 0936   GFRNONAA 116 12/12/2017 0936   GFRAA 134 12/12/2017 0936    A/P: Hypertension longstanding currently *** on current medications.  {Meds adjust:18428} ***.   Results reviewed and written information provided.   Total time in face-to-face counseling *** minutes.   F/U Clinic Visit with Dr. Marland Kitchen  Patient seen with Reino Bellis, PharmD Candidate

## 2018-01-28 ENCOUNTER — Ambulatory Visit: Payer: Self-pay | Admitting: Pharmacist

## 2018-02-04 ENCOUNTER — Telehealth: Payer: Self-pay | Admitting: Nurse Practitioner

## 2018-02-04 DIAGNOSIS — E1165 Type 2 diabetes mellitus with hyperglycemia: Secondary | ICD-10-CM

## 2018-02-04 MED ORDER — GLIPIZIDE 10 MG PO TABS: 10.0000 mg | ORAL_TABLET | Freq: Every day | ORAL | 2 refills | Status: DC

## 2018-02-04 NOTE — Telephone Encounter (Signed)
Pt called to request a refill on her  -glipiZIDE (GLUCOTROL) 10 MG tablet  Please send to  [facility] if approved Please follow up

## 2018-02-04 NOTE — Telephone Encounter (Signed)
Refilled (first script sent to [facility], resent this one to [facility])

## 2018-02-14 ENCOUNTER — Encounter (HOSPITAL_COMMUNITY): Payer: Self-pay

## 2018-02-14 ENCOUNTER — Emergency Department (HOSPITAL_COMMUNITY): Payer: Self-pay

## 2018-02-14 ENCOUNTER — Other Ambulatory Visit: Payer: Self-pay

## 2018-02-14 ENCOUNTER — Emergency Department (HOSPITAL_COMMUNITY)
Admission: EM | Admit: 2018-02-14 | Discharge: 2018-02-14 | Disposition: A | Discharge status: Home / Self Care | Payer: Self-pay | Attending: Emergency Medicine | Admitting: Emergency Medicine

## 2018-02-14 DIAGNOSIS — J40 Bronchitis, not specified as acute or chronic: Secondary | ICD-10-CM

## 2018-02-14 DIAGNOSIS — Z87891 Personal history of nicotine dependence: Secondary | ICD-10-CM | POA: Insufficient documentation

## 2018-02-14 DIAGNOSIS — J029 Acute pharyngitis, unspecified: Secondary | ICD-10-CM | POA: Insufficient documentation

## 2018-02-14 DIAGNOSIS — R05 Cough: Secondary | ICD-10-CM | POA: Insufficient documentation

## 2018-02-14 DIAGNOSIS — H6692 Otitis media, unspecified, left ear: Secondary | ICD-10-CM

## 2018-02-14 DIAGNOSIS — I10 Essential (primary) hypertension: Secondary | ICD-10-CM | POA: Insufficient documentation

## 2018-02-14 DIAGNOSIS — Z794 Long term (current) use of insulin: Secondary | ICD-10-CM | POA: Insufficient documentation

## 2018-02-14 DIAGNOSIS — Z79899 Other long term (current) drug therapy: Secondary | ICD-10-CM | POA: Insufficient documentation

## 2018-02-14 DIAGNOSIS — M7918 Myalgia, other site: Secondary | ICD-10-CM | POA: Insufficient documentation

## 2018-02-14 DIAGNOSIS — E119 Type 2 diabetes mellitus without complications: Secondary | ICD-10-CM | POA: Insufficient documentation

## 2018-02-14 LAB — RAPID STREP SCREEN (MED CTR MEBANE ONLY): Streptococcus, Group A Screen (Direct): NEGATIVE

## 2018-02-14 MED ORDER — BENZONATATE 100 MG PO CAPS: 100.0000 mg | ORAL_CAPSULE | Freq: Three times a day (TID) | ORAL | 0 refills | Status: DC

## 2018-02-14 MED ORDER — ALBUTEROL SULFATE HFA 108 (90 BASE) MCG/ACT IN AERS
2.0000 | INHALATION_SPRAY | RESPIRATORY_TRACT | Status: DC | PRN
  Administered 2018-02-14: 2 via RESPIRATORY_TRACT
  Filled 2018-02-14: qty 6.7

## 2018-02-14 MED ORDER — AMOXICILLIN 500 MG PO CAPS: 500.0000 mg | ORAL_CAPSULE | Freq: Three times a day (TID) | ORAL | 0 refills | Status: DC

## 2018-02-14 NOTE — ED Triage Notes (Signed)
Patient c/oa cough x 5 days, generalized body aches, otalgia,  sore throat and fever since yesterday.

## 2018-02-14 NOTE — ED Provider Notes (Signed)
[facility] DEPT Provider Note   CSN: [id number] Arrival date & time: 02/14/18  [place]     History   Chief Complaint Chief Complaint  Patient presents with  . Cough  . Fever  . Generalized Body Aches  . Otalgia  . Sore Throat    [name] is a 41 y.o. female who presents to the ED with cough and congestion that started 5 days ago with generalized body aches, ear pain bilateral and sore throat. Patient also reports fever that started yesterday.   The history is provided by the patient. No language interpreter was used.  Cough  This is a new problem. The current episode started more than 2 days ago. The problem has been gradually worsening. The cough is productive of sputum. The maximum temperature recorded prior to her arrival was 100 to 100.9 F. Associated symptoms include chills, ear pain, headaches, sore throat and wheezing. Pertinent negatives include no eye redness. She has tried nothing for the symptoms. She is not a smoker.  Fever   Associated symptoms include diarrhea, headaches, sore throat and cough. Pertinent negatives include no vomiting (occasionally with cough).  Otalgia  Associated symptoms include headaches, sore throat, diarrhea and cough. Pertinent negatives include no abdominal pain, no vomiting (occasionally with cough) and no rash.  Sore Throat  Associated symptoms include headaches. Pertinent negatives include no abdominal pain.    Past Medical History:  Diagnosis Date  . 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 Anxiety   . Asthma   . Diabetes mellitus without complication ([place])    type 2  . Hypertension   . Seasonal allergies     Patient Active Problem List   Diagnosis Date Noted  . Sore throat 12/30/2016  . Hypertension 12/14/2015  . Type 2 diabetes mellitus with hyperglycemia, without long-term current use of insulin ([place]) 11/01/2015    Past Surgical History:  Procedure Laterality Date  . TUBAL LIGATION  2009     OB  History   None      Home Medications    Prior to Admission medications   Medication Sig Start Date End Date Taking? Authorizing Provider  amoxicillin (AMOXIL) 500 MG capsule Take 1 capsule (500 mg total) by mouth 3 (three) times daily. 02/14/18   Ashley Murrain, NP  atorvastatin (LIPITOR) 20 MG tablet Take 1 tablet (20 mg total) by mouth daily. 01/08/18   Gildardo Pounds, NP  benzonatate (TESSALON) 100 MG capsule Take 1 capsule (100 mg total) by mouth every 8 (eight) hours. 02/14/18   Ashley Murrain, NP  Blood Glucose Monitoring Suppl (TRUE METRIX METER) DEVI 1 each by Does not apply route 3 (three) times daily before meals. 12/12/17   Alfonse Spruce, FNP  Chlorpheniramine Maleate (ALLERGY PO) Take 1 tablet by mouth daily as needed (allergies.).    [provider]  cyclobenzaprine (FLEXERIL) 5 MG tablet Take 1 tablet (5 mg total) by mouth 2 (two) times daily as needed for muscle spasms. 12/12/17   Alfonse Spruce, FNP  diphenoxylate-atropine (LOMOTIL) 2.5-0.025 MG tablet Take 2 tablets by mouth 4 (four) times daily as needed for diarrhea or loose stools. 01/08/18   Gildardo Pounds, NP  fluticasone (FLONASE) 50 MCG/ACT nasal spray Place 1 spray into both nostrils daily. Patient not taking: Reported on 12/12/2017 12/30/16   Avie Echevaria B, PA-C  glipiZIDE (GLUCOTROL) 10 MG tablet Take 1 tablet (10 mg total) by mouth daily before breakfast. 02/04/18   Raul Del,  Vernia Buff, NP  glucose blood (TRUE METRIX BLOOD GLUCOSE TEST) test strip Use as directed 3 times daily before meals 01/08/18   Gildardo Pounds, NP  ibuprofen (ADVIL,MOTRIN) 600 MG tablet Take 1 tablet (600 mg total) by mouth every 8 (eight) hours as needed. Take with food. 12/12/17   Alfonse Spruce, FNP  Insulin Glargine (LANTUS) 100 UNIT/ML Solostar Pen Inject 10 Units into the skin daily at 10 pm. 01/12/18 02/11/18  Gildardo Pounds, NP  Insulin Pen Needle (B-D UF III MINI PEN NEEDLES) 31G X 5 MM MISC Use as instructed  01/12/18   Gildardo Pounds, NP  lisinopril (PRINIVIL,ZESTRIL) 20 MG tablet Take 1 tablet (20 mg total) by mouth daily. 01/08/18 02/07/18  Gildardo Pounds, NP  metroNIDAZOLE (FLAGYL) 500 MG tablet Take 1 tablet (500 mg total) by mouth 2 (two) times daily. 01/08/18   Gildardo Pounds, NP  naphazoline-pheniramine (NAPHCON-A) 0.025-0.3 % ophthalmic solution Place 2 drops into the left eye every 4 (four) hours as needed for irritation. Patient not taking: Reported on 12/12/2017 08/25/16   Sam, Olivia Canter, PA-C  TRUEPLUS LANCETS 28G MISC 1 each by Does not apply route 3 (three) times daily before meals. 12/12/17   Alfonse Spruce, FNP    Family History Family History  Problem Relation Age of Onset  . Diabetes Mother   . Hypertension Mother   . Dementia Mother   . Diabetes Father   . Hypertension Father     Social History Social History   Tobacco Use  . Smoking status: Former Smoker    Last attempt to quit: 01/25/2007    Years since quitting: 11.0  . Smokeless tobacco: Never Used  Substance Use Topics  . Alcohol use: No  . Drug use: No     Allergies   Lobster [shellfish allergy]   Review of Systems Review of Systems  Constitutional: Positive for chills and fever.  HENT: Positive for ear pain and sore throat.   Eyes: Negative for discharge and redness.  Respiratory: Positive for cough and wheezing.   Gastrointestinal: Positive for diarrhea. Negative for abdominal pain, nausea and vomiting (occasionally with cough).  Genitourinary: Negative for dysuria, frequency and urgency.  Musculoskeletal: Negative for neck stiffness.  Skin: Negative for rash and wound.  Neurological: Positive for headaches. Negative for syncope.  Psychiatric/Behavioral: Negative for confusion.     Physical Exam Updated Vital Signs BP 134/88 (BP Location: Right Arm)   Pulse (!) 114   Temp 100 F (37.8 C) (Oral)   Resp 18   Ht 5' 6.5" (1.689 m)   Wt 95.3 kg (210 lb)   LMP 02/12/2018   SpO2 99%    BMI 33.39 kg/m   Physical Exam  Constitutional: She appears well-developed and well-nourished. No distress.  HENT:  Head: Normocephalic and atraumatic.  Right Ear: Tympanic membrane normal.  Left Ear: There is tenderness. Tympanic membrane is erythematous.  Nose: Mucosal edema and rhinorrhea present.  Mouth/Throat: Uvula is midline and mucous membranes are normal. Posterior oropharyngeal erythema present. No posterior oropharyngeal edema.  Eyes: EOM are normal.  Neck: Neck supple.  Cardiovascular: Regular rhythm. Tachycardia present.  Pulmonary/Chest: She has decreased breath sounds. Wheezes: occasional. She has no rales.  Abdominal: Soft. There is no tenderness.  Musculoskeletal: Normal range of motion.  Neurological: She is alert.  Skin: Skin is warm and dry.  Psychiatric: She has a normal mood and affect. Her behavior is normal.  Nursing note and vitals reviewed.  ED Treatments / Results  Labs (all labs ordered are listed, but only abnormal results are displayed) Labs Reviewed  RAPID STREP SCREEN (NOT AT [facility])  CULTURE, GROUP A STREP [facility])    EKG None  Radiology Dg Chest 2 View  Result Date: 02/14/2018 CLINICAL DATA:  Cough and short of breath EXAM: CHEST - 2 VIEW COMPARISON:  12/25/2015 FINDINGS: The heart size and mediastinal contours are within normal limits. Both lungs are clear. The visualized skeletal structures are unremarkable. IMPRESSION: No active cardiopulmonary disease. Electronically Signed   By: Donavan Foil M.D.   On: 02/14/2018 21:08    Procedures Procedures (including critical care time)  Medications Ordered in ED Medications  albuterol (PROVENTIL HFA;VENTOLIN HFA) 108 (90 Base) MCG/ACT inhaler 2 puff (2 puffs Inhalation Given 02/14/18 2107)     Initial Impression / Assessment and Plan / ED Course  I have reviewed the triage vital signs and the nursing notes. 42 y.o. female with cough and congestion, ear ache and fever stable for d/c without  any focal infiltrate on cxr, negative strep screen and patient does not appear toxic. Will treat for otitis medica and bronchitis. Patient to f/u with her PCP in the next few days or return here for worsening symptoms.   Final Clinical Impressions(s) / ED Diagnoses   Final diagnoses:  Acute otitis media, left  Bronchitis    ED Discharge Orders        Ordered    amoxicillin (AMOXIL) 500 MG capsule  3 times daily     02/14/18 2115    benzonatate (TESSALON) 100 MG capsule  Every 8 hours     02/14/18 2115       Debroah Baller [place], NP 02/14/18 2119    Tegeler, Gwenyth Allegra, MD 02/14/18 [phone]

## 2018-02-14 NOTE — Discharge Instructions (Signed)
Follow up with your doctor to make sure the ear infection is improving. Take the medication as directed. Use the inhaler 2 puffs every 4 hours as needed. Return if symptoms worsen.

## 2018-02-17 LAB — CULTURE, GROUP A STREP (THRC)

## 2018-02-25 ENCOUNTER — Ambulatory Visit: Payer: Self-pay | Attending: Nurse Practitioner | Admitting: Nurse Practitioner

## 2018-02-25 ENCOUNTER — Encounter: Payer: Self-pay | Admitting: Nurse Practitioner

## 2018-02-25 VITALS — BP 116/77 | HR 82 | Temp 98.1°F | Ht 67.0 in | Wt 219.8 lb

## 2018-02-25 DIAGNOSIS — Z760 Encounter for issue of repeat prescription: Secondary | ICD-10-CM | POA: Insufficient documentation

## 2018-02-25 DIAGNOSIS — F419 Anxiety disorder, unspecified: Secondary | ICD-10-CM | POA: Insufficient documentation

## 2018-02-25 DIAGNOSIS — N760 Acute vaginitis: Secondary | ICD-10-CM

## 2018-02-25 DIAGNOSIS — E1165 Type 2 diabetes mellitus with hyperglycemia: Secondary | ICD-10-CM

## 2018-02-25 DIAGNOSIS — I10 Essential (primary) hypertension: Secondary | ICD-10-CM

## 2018-02-25 DIAGNOSIS — Z794 Long term (current) use of insulin: Secondary | ICD-10-CM | POA: Insufficient documentation

## 2018-02-25 DIAGNOSIS — R197 Diarrhea, unspecified: Secondary | ICD-10-CM

## 2018-02-25 DIAGNOSIS — G8929 Other chronic pain: Secondary | ICD-10-CM

## 2018-02-25 DIAGNOSIS — Z91013 Allergy to seafood: Secondary | ICD-10-CM | POA: Insufficient documentation

## 2018-02-25 DIAGNOSIS — M5441 Lumbago with sciatica, right side: Secondary | ICD-10-CM | POA: Insufficient documentation

## 2018-02-25 DIAGNOSIS — J45909 Unspecified asthma, uncomplicated: Secondary | ICD-10-CM | POA: Insufficient documentation

## 2018-02-25 DIAGNOSIS — Z79899 Other long term (current) drug therapy: Secondary | ICD-10-CM | POA: Insufficient documentation

## 2018-02-25 DIAGNOSIS — E782 Mixed hyperlipidemia: Secondary | ICD-10-CM

## 2018-02-25 LAB — GLUCOSE, POCT (MANUAL RESULT ENTRY): POC Glucose: 272 mg/dl — AB (ref 70–99)

## 2018-02-25 MED ORDER — ATORVASTATIN CALCIUM 20 MG PO TABS: 20.0000 mg | ORAL_TABLET | Freq: Every day | ORAL | 0 refills | Status: DC

## 2018-02-25 MED ORDER — DIPHENOXYLATE-ATROPINE 2.5-0.025 MG PO TABS: 2.0000 | ORAL_TABLET | Freq: Four times a day (QID) | ORAL | 1 refills | Status: DC | PRN

## 2018-02-25 MED ORDER — INSULIN GLARGINE 100 UNIT/ML SOLOSTAR PEN: 10.0000 [IU] | PEN_INJECTOR | Freq: Every day | SUBCUTANEOUS | 6 refills | Status: DC

## 2018-02-25 MED ORDER — LISINOPRIL 20 MG PO TABS: 20.0000 mg | ORAL_TABLET | Freq: Every day | ORAL | 3 refills | Status: DC

## 2018-02-25 MED ORDER — CYCLOBENZAPRINE HCL 5 MG PO TABS: 5.0000 mg | ORAL_TABLET | Freq: Two times a day (BID) | ORAL | 1 refills | Status: DC | PRN

## 2018-02-25 MED ORDER — GLIPIZIDE 10 MG PO TABS: 10.0000 mg | ORAL_TABLET | Freq: Every day | ORAL | 1 refills | Status: DC

## 2018-02-25 MED FILL — !LANTUS SOLOSTAR 100UNITS/M: 100 | 28 days supply | Qty: 3 | Fill #0

## 2018-02-25 MED FILL — ATORVASTATIN 20 MG TABLET: 20 | 30 days supply | Qty: 30 | Fill #1

## 2018-02-25 MED FILL — glipiZIDE 10 MG TABS: 10 | 30 days supply | Qty: 30 | Fill #0

## 2018-02-25 MED FILL — LISINOPRIL 20 MG TAB: 20 | 30 days supply | Qty: 30 | Fill #1

## 2018-02-25 MED FILL — CYCLOBENZAPRINE 5 MG TABLET: 5 | 15 days supply | Qty: 30 | Fill #1

## 2018-02-25 NOTE — Patient Instructions (Addendum)
Diabetes blood sugar goals  Fasting in AM before breakfast which means at least 8 hrs of no eating or drinking) except water or unsweetened coffee or tea): 90-110 2 hrs after meals: < 160,   Hypoglycemia or low blood sugar: < 70 (You should not have hypoglycemia.)  Aim for 30 minutes of exercise most days. Rethink what you drink. Water is great! Aim for 2-3 Carb Choices per meal (30-45 grams) +/- 1 either way  Aim for 0-15 Carbs per snack if hungry  Include protein in moderation with your meals and snacks  Consider reading food labels for Total Carbohydrate and Fat Grams of foods  Consider checking BG at alternate times per day  Continue taking medication as directed Be mindful about how much sugar you are adding to beverages and other foods. Fruit Punch - find one with no sugar  Measure and decrease portions of carbohydrate foods  Make your plate and don't go back for seconds 

## 2018-02-25 NOTE — Progress Notes (Signed)
Assessment & Plan:  Wendy Olson was seen today for follow-up and medication refill.  Diagnoses and all orders for this visit:  Type 2 diabetes mellitus with hyperglycemia, without long-term current use of insulin (HCC) -     Glucose (CBG) -     glipiZIDE (GLUCOTROL) 10 MG tablet; Take 1 tablet (10 mg total) by mouth daily before breakfast. -     Insulin Glargine (LANTUS) 100 UNIT/ML Solostar Pen; Inject 10 Units into the skin daily at 10 pm. Diabetes is poorly controlled. Advised patient to keep a fasting blood sugar log fast, 2 hours post lunch and bedtime which will be reviewed at the next office visit.  Mixed hyperlipidemia -     atorvastatin (LIPITOR) 20 MG tablet; Take 1 tablet (20 mg total) by mouth daily. Work on a low fat, heart healthy diet and participate in regular aerobic exercise program to control as well by working out at least 150 minutes per week. No fried foods. No junk foods, sodas, sugary drinks, unhealthy snacking, or smoking.   Chronic bilateral low back pain with right-sided sciatica -     cyclobenzaprine (FLEXERIL) 5 MG tablet; Take 1 tablet (5 mg total) by mouth 2 (two) times daily as needed for muscle spasms. Work on losing weight to help reduce back pain. May alternate with heat and ice application for pain relief. May also alternate with acetaminophen and Ibuprofen as prescribed for back pain. Other alternatives include massage, acupuncture and water aerobics.  You must stay active and avoid a sedentary lifestyle.   Essential hypertension -     lisinopril (PRINIVIL,ZESTRIL) 20 MG tablet; Take 1 tablet (20 mg total) by mouth daily. Continue all antihypertensives as prescribed.  Remember to bring in your blood pressure log with you for your follow up appointment.  DASH/Mediterranean Diets are healthier choices for HTN.    Diarrhea, unspecified type Chronic. Controlled with lomitil. Will need GI consult to evaluate for IBS once seen by financial counselor and  approved for financial assistance.  -     diphenoxylate-atropine (LOMOTIL) 2.5-0.025 MG tablet; Take 2 tablets by mouth 4 (four) times daily as needed for diarrhea or loose stools.  Acute vaginitis -     Cervicovaginal ancillary only    Patient has been counseled on age-appropriate routine health concerns for screening and prevention. These are reviewed and up-to-date. Referrals have been placed accordingly. Immunizations are up-to-date or declined.    Subjective:   Chief Complaint  Patient presents with  . Follow-up    Pt is here for a blood pressure re-check.   . Medication Refill   [name] 42 y.o. female presents to office today for follow up to hypertension.  Essential Hypertension Chronic. Well controlled. Medication compliant taking lisinopril 20mg  daily. Denies chest pain, shortness of breath, palpitations, lightheadedness, dizziness, headaches or BLE edema.  BP Readings from Last 3 Encounters:  02/25/18 116/77  02/14/18 134/88  01/08/18 (!) 140/94    Vaginitis Patient complains of an abnormal vaginal discharge for 1 week. Vaginal symptoms include burning, discharge described as milky, local irritation and odor.Vulvar symptoms include none.STI Risk: Very low risk of STD exposure. Other associated symptoms: left flank pain.   Hyperlipidemia Not well controlled. LDL not at goal of <80. Medication compliant with taking atorvastatin however she is not diet or exercise compliant. Denies statin intolerance. She endorses myalgia however this is chronic in nature and prior to initiation of statin.  Lab Results  Component Value Date   [facility]  191 (H) 12/12/2017    Review of Systems  Constitutional: Negative for fever, malaise/fatigue and weight loss.  HENT: Negative.  Negative for nosebleeds.   Eyes: Negative.  Negative for blurred vision, double vision and photophobia.  Respiratory: Negative.  Negative for cough and shortness of breath.   Cardiovascular:  Negative.  Negative for chest pain, palpitations and leg swelling.  Gastrointestinal: Positive for diarrhea. Negative for heartburn, nausea and vomiting.  Genitourinary:       SEE HPI  Musculoskeletal: Positive for back pain and myalgias. Negative for falls.  Neurological: Negative.  Negative for dizziness, focal weakness, seizures and headaches.  Psychiatric/Behavioral: Negative.  Negative for suicidal ideas.    Past Medical History:  Diagnosis Date  . 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 Anxiety   . Asthma   . Diabetes mellitus without complication ([place])    type 2  . Hypertension   . Seasonal allergies     Past Surgical History:  Procedure Laterality Date  . TUBAL LIGATION  2009    Family History  Problem Relation Age of Onset  . Diabetes Mother   . Hypertension Mother   . Dementia Mother   . Diabetes Father   . Hypertension Father     Social History Reviewed with no changes to be made today.   Outpatient Medications Prior to Visit  Medication Sig Dispense Refill  . amoxicillin (AMOXIL) 500 MG capsule Take 1 capsule (500 mg total) by mouth 3 (three) times daily. 30 capsule 0  . Blood Glucose Monitoring Suppl (TRUE METRIX METER) DEVI 1 each by Does not apply route 3 (three) times daily before meals. 1 Device 0  . glucose blood (TRUE METRIX BLOOD GLUCOSE TEST) test strip Use as directed 3 times daily before meals 100 each 12  . Insulin Pen Needle (B-D UF III MINI PEN NEEDLES) 31G X 5 MM MISC Use as instructed 100 each 1  . TRUEPLUS LANCETS 28G MISC 1 each by Does not apply route 3 (three) times daily before meals. 100 each 12  . atorvastatin (LIPITOR) 20 MG tablet Take 1 tablet (20 mg total) by mouth daily. 30 tablet 2  . diphenoxylate-atropine (LOMOTIL) 2.5-0.025 MG tablet Take 2 tablets by mouth 4 (four) times daily as needed for diarrhea or loose stools. 40 tablet 1  . glipiZIDE (GLUCOTROL) 10 MG tablet Take 1 tablet (10 mg total) by mouth daily before breakfast. 30 tablet 2  .  Chlorpheniramine Maleate (ALLERGY PO) Take 1 tablet by mouth daily as needed (allergies.).    Marland Kitchen ibuprofen (ADVIL,MOTRIN) 600 MG tablet Take 1 tablet (600 mg total) by mouth every 8 (eight) hours as needed. Take with food. (Patient not taking: Reported on 02/25/2018) 30 tablet 1  . benzonatate (TESSALON) 100 MG capsule Take 1 capsule (100 mg total) by mouth every 8 (eight) hours. (Patient not taking: Reported on 02/25/2018) 21 capsule 0  . cyclobenzaprine (FLEXERIL) 5 MG tablet Take 1 tablet (5 mg total) by mouth 2 (two) times daily as needed for muscle spasms. (Patient not taking: Reported on 02/25/2018) 30 tablet 1  . fluticasone (FLONASE) 50 MCG/ACT nasal spray Place 1 spray into both nostrils daily. (Patient not taking: Reported on 12/12/2017) 16 g 2  . Insulin Glargine (LANTUS) 100 UNIT/ML Solostar Pen Inject 10 Units into the skin daily at 10 pm. 3 mL 6  . lisinopril (PRINIVIL,ZESTRIL) 20 MG tablet Take 1 tablet (20 mg total) by mouth daily. 30 tablet 3  . metroNIDAZOLE (FLAGYL) 500 MG tablet  Take 1 tablet (500 mg total) by mouth 2 (two) times daily. (Patient not taking: Reported on 02/25/2018) 14 tablet 0  . naphazoline-pheniramine (NAPHCON-A) 0.025-0.3 % ophthalmic solution Place 2 drops into the left eye every 4 (four) hours as needed for irritation. (Patient not taking: Reported on 12/12/2017) 5 mL 0   No facility-administered medications prior to visit.     Allergies  Allergen Reactions  . Lobster [Shellfish Allergy] Anaphylaxis    Just lobster        Objective:    BP 116/77 (BP Location: Right Arm, Patient Position: Sitting, Cuff Size: Normal)   Pulse 82   Temp 98.1 F (36.7 C) (Oral)   Ht 5\' 7"  (1.702 m)   Wt 219 lb 12.8 oz (99.7 kg)   LMP 02/12/2018   SpO2 97%   BMI 34.43 kg/m  Wt Readings from Last 3 Encounters:  02/25/18 219 lb 12.8 oz (99.7 kg)  02/14/18 210 lb (95.3 kg)  01/08/18 217 lb 12.8 oz (98.8 kg)    Physical Exam  Constitutional: She is oriented to person, place,  and time. She appears well-developed and well-nourished. She is cooperative.  HENT:  Head: Normocephalic and atraumatic.  Eyes: EOM are normal.  Neck: Normal range of motion.  Cardiovascular: Normal rate, regular rhythm and normal heart sounds. Exam reveals no gallop and no friction rub.  No murmur heard. Pulmonary/Chest: Effort normal and breath sounds normal. No tachypnea. No respiratory distress. She has no decreased breath sounds. She has no wheezes. She has no rhonchi. She has no rales. She exhibits no tenderness.  Abdominal: Bowel sounds are normal.  Musculoskeletal: Normal range of motion. She exhibits no edema.       Lumbar back: She exhibits pain.  Neurological: She is alert and oriented to person, place, and time. Coordination normal.  Skin: Skin is warm and dry.  Psychiatric: She has a normal mood and affect. Her behavior is normal. Judgment and thought content normal.  Nursing note and vitals reviewed.     Patient has been counseled extensively about nutrition and exercise as well as the importance of adherence with medications and regular follow-up. The patient was given clear instructions to go to ER or return to medical center if symptoms don't improve, worsen or new problems develop. The patient verbalized understanding.   Follow-up: Return in about 1 month (around 03/25/2018) for HTN/HPL/DM.   Gildardo Pounds, FNP-BC [facility] and [place], [place]   02/25/2018, 1:16 PM

## 2018-02-26 ENCOUNTER — Other Ambulatory Visit: Payer: Self-pay | Admitting: Nurse Practitioner

## 2018-02-26 ENCOUNTER — Encounter: Payer: Self-pay | Admitting: Nurse Practitioner

## 2018-02-26 LAB — CERVICOVAGINAL ANCILLARY ONLY
Bacterial vaginitis: POSITIVE — AB
CHLAMYDIA, DNA PROBE: NEGATIVE
Candida vaginitis: POSITIVE — AB
Neisseria Gonorrhea: NEGATIVE
Trichomonas: NEGATIVE

## 2018-02-26 MED ORDER — FLUCONAZOLE 150 MG PO TABS: 150.0000 mg | ORAL_TABLET | Freq: Once | ORAL | 0 refills | Status: AC

## 2018-02-26 MED ORDER — METRONIDAZOLE 500 MG PO TABS: 500.0000 mg | ORAL_TABLET | Freq: Two times a day (BID) | ORAL | 0 refills | Status: AC

## 2018-04-01 ENCOUNTER — Ambulatory Visit: Payer: Self-pay | Admitting: Nurse Practitioner

## 2018-04-07 ENCOUNTER — Encounter (HOSPITAL_COMMUNITY): Payer: Self-pay | Admitting: Emergency Medicine

## 2018-04-07 ENCOUNTER — Other Ambulatory Visit: Payer: Self-pay

## 2018-04-07 ENCOUNTER — Emergency Department (HOSPITAL_COMMUNITY)
Admission: EM | Admit: 2018-04-07 | Discharge: 2018-04-07 | Disposition: A | Discharge status: Home / Self Care | Payer: Self-pay | Attending: Emergency Medicine | Admitting: Emergency Medicine

## 2018-04-07 ENCOUNTER — Emergency Department (HOSPITAL_COMMUNITY): Payer: Self-pay

## 2018-04-07 DIAGNOSIS — Z794 Long term (current) use of insulin: Secondary | ICD-10-CM | POA: Insufficient documentation

## 2018-04-07 DIAGNOSIS — E119 Type 2 diabetes mellitus without complications: Secondary | ICD-10-CM | POA: Insufficient documentation

## 2018-04-07 DIAGNOSIS — J45909 Unspecified asthma, uncomplicated: Secondary | ICD-10-CM | POA: Insufficient documentation

## 2018-04-07 DIAGNOSIS — I10 Essential (primary) hypertension: Secondary | ICD-10-CM | POA: Insufficient documentation

## 2018-04-07 DIAGNOSIS — Z87891 Personal history of nicotine dependence: Secondary | ICD-10-CM | POA: Insufficient documentation

## 2018-04-07 DIAGNOSIS — R51 Headache: Secondary | ICD-10-CM | POA: Insufficient documentation

## 2018-04-07 DIAGNOSIS — R519 Headache, unspecified: Secondary | ICD-10-CM

## 2018-04-07 DIAGNOSIS — Z79899 Other long term (current) drug therapy: Secondary | ICD-10-CM | POA: Insufficient documentation

## 2018-04-07 LAB — CBC WITH DIFFERENTIAL/PLATELET
BASOS ABS: 0 10*3/uL (ref 0.0–0.1)
BASOS PCT: 1 %
EOS ABS: 0 10*3/uL (ref 0.0–0.7)
Eosinophils Relative: 1 %
HCT: 37.9 % (ref 36.0–46.0)
Hemoglobin: 13.6 g/dL (ref 12.0–15.0)
Lymphocytes Relative: 16 %
Lymphs Abs: 0.7 10*3/uL (ref 0.7–4.0)
MCH: 30.4 pg (ref 26.0–34.0)
MCHC: 35.9 g/dL (ref 30.0–36.0)
MCV: 84.6 fL (ref 78.0–100.0)
Monocytes Absolute: 0.4 10*3/uL (ref 0.1–1.0)
Monocytes Relative: 9 %
NEUTROS ABS: 3.1 10*3/uL (ref 1.7–7.7)
Neutrophils Relative %: 73 %
Platelets: 242 10*3/uL (ref 150–400)
RBC: 4.48 MIL/uL (ref 3.87–5.11)
RDW: 12.2 % (ref 11.5–15.5)
WBC: 4.1 10*3/uL (ref 4.0–10.5)

## 2018-04-07 LAB — COMPREHENSIVE METABOLIC PANEL
ALBUMIN: 4.1 g/dL (ref 3.5–5.0)
ALK PHOS: 153 U/L — AB (ref 38–126)
ALT: 39 U/L (ref 14–54)
ANION GAP: 9 (ref 5–15)
AST: 27 U/L (ref 15–41)
BUN: 7 mg/dL (ref 6–20)
CALCIUM: 9.2 mg/dL (ref 8.9–10.3)
CO2: 24 mmol/L (ref 22–32)
Chloride: 103 mmol/L (ref 101–111)
Creatinine, Ser: 0.55 mg/dL (ref 0.44–1.00)
GFR calc Af Amer: >60 mL/min (ref >60)
GFR calc non Af Amer: >60 mL/min (ref >60)
GLUCOSE: 191 mg/dL — AB (ref 65–99)
POTASSIUM: 3.9 mmol/L (ref 3.5–5.1)
Sodium: 136 mmol/L (ref 135–145)
TOTAL PROTEIN: 7.4 g/dL (ref 6.5–8.1)
Total Bilirubin: 0.8 mg/dL (ref 0.3–1.2)

## 2018-04-07 MED ORDER — SODIUM CHLORIDE 0.9 % IV BOLUS
1000.0000 mL | Freq: Once | INTRAVENOUS | Status: AC
Start: 2018-04-07 — End: 2018-04-07
  Administered 2018-04-07: 1000 mL via INTRAVENOUS

## 2018-04-07 MED ORDER — ONDANSETRON HCL 4 MG/2ML IJ SOLN
4.0000 mg | Freq: Once | INTRAMUSCULAR | Status: AC
  Administered 2018-04-07: 4 mg via INTRAVENOUS
  Filled 2018-04-07: qty 2

## 2018-04-07 MED ORDER — DIPHENHYDRAMINE HCL 50 MG/ML IJ SOLN
12.5000 mg | Freq: Once | INTRAMUSCULAR | Status: AC
  Administered 2018-04-07: 12.5 mg via INTRAVENOUS
  Filled 2018-04-07: qty 1

## 2018-04-07 MED ORDER — KETOROLAC TROMETHAMINE 15 MG/ML IJ SOLN
15.0000 mg | Freq: Once | INTRAMUSCULAR | Status: AC
  Administered 2018-04-07: 15 mg via INTRAVENOUS
  Filled 2018-04-07: qty 1

## 2018-04-07 NOTE — ED Provider Notes (Signed)
[facility] DEPT Provider Note   CSN: [id number] Arrival date & time: 04/07/18  1125     History   Chief Complaint Chief Complaint  Patient presents with  . Hypertension    [name] is a 42 y.o. female who presents with a headache. PMH significant for HTN, HLD, DM. She states that last night she developed a headache. It is posterior and frontal. She denies similar headaches. She took 2 Tylenol and went to sleep. When she woke up she still had the headache. She thought maybe her symptoms were related to her blood pressure or blood sugar so took her medicine. She still felt bad and then checked her BP and it was 494 systolic. She then felt dizzy and off balance therefore decided to come to the ED. She denies fever, LOC, trauma, neck stiffness, acute onset, worst headache of life. She also endorses chest pain, SOB, generalized abdominal pain, numbness of the left side of her face, right sided leg weakness.   HPI  Past Medical History:  Diagnosis Date  . 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 Anxiety   . Asthma   . Diabetes mellitus without complication ([place])    type 2  . Hypertension   . Seasonal allergies     Patient Active Problem List   Diagnosis Date Noted  . Sore throat 12/30/2016  . Hypertension 12/14/2015  . Type 2 diabetes mellitus with hyperglycemia, without long-term current use of insulin ([place]) 11/01/2015    Past Surgical History:  Procedure Laterality Date  . TUBAL LIGATION  2009     OB History   None      Home Medications    Prior to Admission medications   Medication Sig Start Date End Date Taking? Authorizing Provider  acetaminophen (TYLENOL) 500 MG tablet Take 500 mg by mouth every 6 (six) hours as needed for headache.   Yes [provider]  diphenoxylate-atropine (LOMOTIL) 2.5-0.025 MG tablet Take 2 tablets by mouth 4 (four) times daily as needed for diarrhea or loose stools. 02/25/18  Yes Gildardo Pounds, NP    glipiZIDE (GLUCOTROL) 10 MG tablet Take 1 tablet (10 mg total) by mouth daily before breakfast. 02/25/18  Yes Gildardo Pounds, NP  lisinopril (PRINIVIL,ZESTRIL) 20 MG tablet Take 20 mg by mouth daily.   Yes [provider]  amoxicillin (AMOXIL) 500 MG capsule Take 1 capsule (500 mg total) by mouth 3 (three) times daily. Patient not taking: Reported on 04/07/2018 02/14/18   Ashley Murrain, NP  atorvastatin (LIPITOR) 20 MG tablet Take 1 tablet (20 mg total) by mouth daily. Patient not taking: Reported on 04/07/2018 02/25/18   Gildardo Pounds, NP  Blood Glucose Monitoring Suppl (TRUE METRIX METER) DEVI 1 each by Does not apply route 3 (three) times daily before meals. 12/12/17   Alfonse Spruce, FNP  cyclobenzaprine (FLEXERIL) 5 MG tablet Take 1 tablet (5 mg total) by mouth 2 (two) times daily as needed for muscle spasms. Patient not taking: Reported on 04/07/2018 02/25/18   Gildardo Pounds, NP  glucose blood (TRUE METRIX BLOOD GLUCOSE TEST) test strip Use as directed 3 times daily before meals 01/08/18   Gildardo Pounds, NP  ibuprofen (ADVIL,MOTRIN) 600 MG tablet Take 1 tablet (600 mg total) by mouth every 8 (eight) hours as needed. Take with food. Patient not taking: Reported on 02/25/2018 12/12/17   Alfonse Spruce, FNP  Insulin Glargine (LANTUS) 100 UNIT/ML Solostar Pen Inject 10 Units  into the skin daily at 10 pm. 02/25/18 03/27/18  Gildardo Pounds, NP  Insulin Pen Needle (B-D UF III MINI PEN NEEDLES) 31G X 5 MM MISC Use as instructed 01/12/18   Gildardo Pounds, NP  lisinopril (PRINIVIL,ZESTRIL) 20 MG tablet Take 1 tablet (20 mg total) by mouth daily. 02/25/18 03/27/18  Gildardo Pounds, NP  TRUEPLUS LANCETS 28G MISC 1 each by Does not apply route 3 (three) times daily before meals. 12/12/17   Alfonse Spruce, FNP    Family History Family History  Problem Relation Age of Onset  . Diabetes Mother   . Hypertension Mother   . Dementia Mother   . Diabetes Father   . Hypertension Father      Social History Social History   Tobacco Use  . Smoking status: Former Smoker    Last attempt to quit: 01/25/2007    Years since quitting: 11.2  . Smokeless tobacco: Never Used  Substance Use Topics  . Alcohol use: No  . Drug use: No     Allergies   Lobster [shellfish allergy] and Reglan [metoclopramide]   Review of Systems Review of Systems  Constitutional: Negative for fever.  Respiratory: Positive for shortness of breath.   Cardiovascular: Positive for chest pain.  Gastrointestinal: Positive for abdominal pain, constipation and nausea. Negative for diarrhea and vomiting.  Genitourinary: Negative for dysuria and hematuria.  Neurological: Positive for dizziness, weakness, numbness and headaches. Negative for syncope.  All other systems reviewed and are negative.    Physical Exam Updated Vital Signs BP 124/90 (BP Location: Left Arm)   Pulse 98   Temp 98.5 F (36.9 C) (Oral)   Resp 14   Ht 5\' 6"  (1.676 m)   Wt 99.3 kg (219 lb)   LMP 04/07/2018   SpO2 98%   BMI 35.35 kg/m   Physical Exam  Constitutional: She is oriented to person, place, and time. She appears well-developed and well-nourished. No distress.  HENT:  Head: Normocephalic and atraumatic.  Eyes: Pupils are equal, round, and reactive to light. Conjunctivae are normal. Right eye exhibits no discharge. Left eye exhibits no discharge. No scleral icterus.  Neck: Normal range of motion.  Cardiovascular: Normal rate and regular rhythm.  Pulmonary/Chest: Effort normal and breath sounds normal. No respiratory distress.  Abdominal: Soft. Bowel sounds are normal. She exhibits no distension. There is tenderness (mild, generalized).  Neurological: She is alert and oriented to person, place, and time.  Lying on stretcher in NAD. GCS 15. Speaks in a clear voice. Cranial nerves II through XII grossly intact. 5/5 strength in all extremities. Sensation fully intact.  Bilateral finger-to-nose intact. Mildly off balance   Skin: Skin is warm and dry.  Psychiatric: She has a normal mood and affect. Her behavior is normal.  Nursing note and vitals reviewed.    ED Treatments / Results  Labs (all labs ordered are listed, but only abnormal results are displayed) Labs Reviewed  COMPREHENSIVE METABOLIC PANEL - Abnormal; Notable for the following components:      Result Value   Glucose, Bld 191 (*)    Alkaline Phosphatase 153 (*)    All other components within normal limits  CBC WITH DIFFERENTIAL/PLATELET  URINALYSIS, ROUTINE W REFLEX MICROSCOPIC  PREGNANCY, URINE    EKG None  Radiology Ct Head Wo Contrast  Result Date: 04/07/2018 CLINICAL DATA:  Headache. EXAM: CT HEAD WITHOUT CONTRAST TECHNIQUE: Contiguous axial images were obtained from the base of the skull through the vertex without intravenous contrast.  COMPARISON:  None. FINDINGS: Brain: No evidence of acute infarction, hemorrhage, hydrocephalus, extra-axial collection or mass lesion/mass effect. Vascular: No hyperdense vessel or unexpected calcification. Skull: Normal. Negative for fracture or focal lesion. Sinuses/Orbits: No acute finding. Other: None. IMPRESSION: Normal head CT. Electronically Signed   By: Marijo Conception, M.D.   On: 04/07/2018 17:46    Procedures Procedures (including critical care time)  Medications Ordered in ED Medications  sodium chloride 0.9 % bolus 1,000 mL (0 mLs Intravenous Stopped 04/07/18 1743)  ketorolac (TORADOL) 15 MG/ML injection 15 mg (15 mg Intravenous Given 04/07/18 1646)  diphenhydrAMINE (BENADRYL) injection 12.5 mg (12.5 mg Intravenous Given 04/07/18 1647)  ondansetron (ZOFRAN) injection 4 mg (4 mg Intravenous Given 04/07/18 1646)     Initial Impression / Assessment and Plan / ED Course  I have reviewed the triage vital signs and the nursing notes.  Pertinent labs & imaging results that were available during my care of the patient were reviewed by me and considered in my medical decision making (see chart  for details).  42 year old female presents primarily for headache and dizziness in the setting of her blood pressure being elevated earlier. Her vitals are normal in the ED. She has a pan-positive ROS. Her neuro exam is remarkable for being mildly off balance but is otherwise normal. Will get CT head, labs, and give migraine cocktail.  EKG, CT, Labs are normal. She feels better after headache cocktail. Will d/c with PCP follow up.  Final Clinical Impressions(s) / ED Diagnoses   Final diagnoses:  Bad headache    ED Discharge Orders    None       Recardo Evangelist, PA-C 04/07/18 1958    Julianne Rice, MD 04/10/18 0800

## 2018-04-07 NOTE — ED Triage Notes (Signed)
Per [facility] pt complaint of headache; checked BP; elevated; compliant with BP medication.

## 2018-05-06 DIAGNOSIS — I10 Essential (primary) hypertension: Secondary | ICD-10-CM

## 2018-05-13 ENCOUNTER — Encounter: Payer: Self-pay | Admitting: Nurse Practitioner

## 2018-05-13 ENCOUNTER — Ambulatory Visit: Payer: Self-pay | Attending: Nurse Practitioner | Admitting: Nurse Practitioner

## 2018-05-13 VITALS — BP 142/90 | HR 82 | Temp 98.2°F | Ht 67.0 in | Wt 209.6 lb

## 2018-05-13 DIAGNOSIS — Z79899 Other long term (current) drug therapy: Secondary | ICD-10-CM | POA: Insufficient documentation

## 2018-05-13 DIAGNOSIS — Z760 Encounter for issue of repeat prescription: Secondary | ICD-10-CM | POA: Insufficient documentation

## 2018-05-13 DIAGNOSIS — K047 Periapical abscess without sinus: Secondary | ICD-10-CM | POA: Insufficient documentation

## 2018-05-13 DIAGNOSIS — Z8249 Family history of ischemic heart disease and other diseases of the circulatory system: Secondary | ICD-10-CM | POA: Insufficient documentation

## 2018-05-13 DIAGNOSIS — F419 Anxiety disorder, unspecified: Secondary | ICD-10-CM | POA: Insufficient documentation

## 2018-05-13 DIAGNOSIS — Z888 Allergy status to other drugs, medicaments and biological substances status: Secondary | ICD-10-CM | POA: Insufficient documentation

## 2018-05-13 DIAGNOSIS — G8929 Other chronic pain: Secondary | ICD-10-CM | POA: Insufficient documentation

## 2018-05-13 DIAGNOSIS — I10 Essential (primary) hypertension: Secondary | ICD-10-CM | POA: Insufficient documentation

## 2018-05-13 DIAGNOSIS — E1165 Type 2 diabetes mellitus with hyperglycemia: Secondary | ICD-10-CM | POA: Insufficient documentation

## 2018-05-13 DIAGNOSIS — M5441 Lumbago with sciatica, right side: Secondary | ICD-10-CM | POA: Insufficient documentation

## 2018-05-13 DIAGNOSIS — Z91013 Allergy to seafood: Secondary | ICD-10-CM | POA: Insufficient documentation

## 2018-05-13 DIAGNOSIS — M5442 Lumbago with sciatica, left side: Secondary | ICD-10-CM | POA: Insufficient documentation

## 2018-05-13 DIAGNOSIS — J45909 Unspecified asthma, uncomplicated: Secondary | ICD-10-CM | POA: Insufficient documentation

## 2018-05-13 DIAGNOSIS — Z794 Long term (current) use of insulin: Secondary | ICD-10-CM | POA: Insufficient documentation

## 2018-05-13 DIAGNOSIS — E782 Mixed hyperlipidemia: Secondary | ICD-10-CM | POA: Insufficient documentation

## 2018-05-13 LAB — POCT GLYCOSYLATED HEMOGLOBIN (HGB A1C): HEMOGLOBIN A1C: 8.5 % — AB (ref 4.0–5.6)

## 2018-05-13 LAB — GLUCOSE, POCT (MANUAL RESULT ENTRY): POC Glucose: 261 mg/dl — AB (ref 70–99)

## 2018-05-13 MED ORDER — INSULIN PEN NEEDLE 31G X 5 MM MISC: 1 refills | Status: DC

## 2018-05-13 MED ORDER — IBUPROFEN 800 MG PO TABS: 800.0000 mg | ORAL_TABLET | Freq: Three times a day (TID) | ORAL | 1 refills | Status: DC | PRN

## 2018-05-13 MED ORDER — LISINOPRIL 20 MG PO TABS: 20.0000 mg | ORAL_TABLET | Freq: Every day | ORAL | 1 refills | Status: DC

## 2018-05-13 MED ORDER — CHLORHEXIDINE GLUCONATE 0.12 % MT SOLN: 15.0000 mL | Freq: Two times a day (BID) | OROMUCOSAL | 0 refills | Status: DC

## 2018-05-13 MED ORDER — GLIPIZIDE 10 MG PO TABS: 10.0000 mg | ORAL_TABLET | Freq: Every day | ORAL | 1 refills | Status: DC

## 2018-05-13 MED ORDER — GABAPENTIN 300 MG PO CAPS: 300.0000 mg | ORAL_CAPSULE | Freq: Three times a day (TID) | ORAL | 3 refills | Status: DC

## 2018-05-13 MED ORDER — INSULIN GLARGINE 100 UNIT/ML SOLOSTAR PEN: 10.0000 [IU] | PEN_INJECTOR | Freq: Every day | SUBCUTANEOUS | 11 refills | Status: DC

## 2018-05-13 MED ORDER — ATORVASTATIN CALCIUM 20 MG PO TABS: 20.0000 mg | ORAL_TABLET | Freq: Every day | ORAL | 3 refills | Status: DC

## 2018-05-13 MED ORDER — AMOXICILLIN 500 MG PO CAPS: 500.0000 mg | ORAL_CAPSULE | Freq: Three times a day (TID) | ORAL | 0 refills | Status: DC

## 2018-05-13 MED FILL — glipiZIDE 10 MG TABS: 10 | 30 days supply | Qty: 30 | Fill #0

## 2018-05-13 MED FILL — TRUEPLUS PEN NDL 31GX3/16": 31G X 5 MM | 25 days supply | Qty: 100 | Fill #0

## 2018-05-13 MED FILL — IBUPROFEN 800 MG TABLET: 800 | 20 days supply | Qty: 60 | Fill #0

## 2018-05-13 MED FILL — ATORVASTATIN 20 MG TABLET: 20 | 30 days supply | Qty: 30 | Fill #0

## 2018-05-13 MED FILL — AMOXICILLIN 500 MG CAPSULE: 500 | 10 days supply | Qty: 30 | Fill #0

## 2018-05-13 MED FILL — TRUEPLUS PEN NDL 31GX3/16: 31G X 5 MM | 25 days supply | Qty: 100 | Fill #0

## 2018-05-13 MED FILL — GABAPENTIN 300 MG CAPSULE: 300 | 30 days supply | Qty: 90 | Fill #0

## 2018-05-13 MED FILL — LISINOPRIL 20 MG TAB: 20 | 30 days supply | Qty: 30 | Fill #0

## 2018-05-13 MED FILL — $LANTUS SOLOSTAR 100 UNITS/: 100 | 28 days supply | Qty: 3 | Fill #0

## 2018-05-13 NOTE — Progress Notes (Signed)
26

## 2018-05-13 NOTE — Progress Notes (Signed)
Assessment & Plan:  Wendy Olson was seen today for follow-up, back pain and medication refill.  Diagnoses and all orders for this visit:  Type 2 diabetes mellitus with hyperglycemia, without long-term current use of insulin ([facility]) -     Glucose (CBG) -     HgB A1c -     glipiZIDE (GLUCOTROL) 10 MG tablet; Take 1 tablet (10 mg total) by mouth daily before breakfast. -     Insulin Glargine (LANTUS) 100 UNIT/ML Solostar Pen; Inject 10 Units into the skin daily at 10 pm. -     gabapentin (NEURONTIN) 300 MG capsule; Take 1 capsule (300 mg total) by mouth 3 (three) times daily. -     Insulin Pen Needle (B-D UF III MINI PEN NEEDLES) 31G X 5 MM MISC; Use as instructed Continue blood sugar control as discussed in office today, low carbohydrate diet, and regular physical exercise as tolerated, 150 minutes per week (30 min each day, 5 days per week, or 50 min 3 days per week). Keep blood sugar logs with fasting goal of 80-130 mg/dl, post prandial less than 180.  For Hypoglycemia: BS <60 and Hyperglycemia BS >400; contact the clinic ASAP. Annual eye exams and foot exams are recommended.  Infection of tooth -     amoxicillin (AMOXIL) 500 MG capsule; Take 1 capsule (500 mg total) by mouth 3 (three) times daily. -     chlorhexidine (PERIDEX) 0.12 % solution; Use as directed 15 mLs in the mouth or throat 2 (two) times daily.  Essential hypertension -     lisinopril (PRINIVIL,ZESTRIL) 20 MG tablet; Take 1 tablet (20 mg total) by mouth daily. Continue all antihypertensives as prescribed.  Remember to bring in your blood pressure log with you for your follow up appointment.  DASH/Mediterranean Diets are healthier choices for HTN.   Chronic bilateral low back pain with bilateral sciatica -     ibuprofen (ADVIL,MOTRIN) 800 MG tablet; Take 1 tablet (800 mg total) by mouth every 8 (eight) hours as needed. Work on losing weight to help reduce back pain. May alternate with heat and ice application for pain relief.  May also alternate with acetaminophen  as prescribed for back pain. Other alternatives include massage, acupuncture and water aerobics.  You must stay active and avoid a sedentary lifestyle.    Mixed hyperlipidemia -     atorvastatin (LIPITOR) 20 MG tablet; Take 1 tablet (20 mg total) by mouth daily. INSTRUCTIONS: Work on a low fat, heart healthy diet and participate in regular aerobic exercise program by working out at least 150 minutes per week. No fried foods. No junk foods, sodas, sugary drinks, unhealthy snacking, alcohol or smoking.     Patient has been counseled on age-appropriate routine health concerns for screening and prevention. These are reviewed and up-to-date. Referrals have been placed accordingly. Immunizations are up-to-date or declined.    Subjective:   Chief Complaint  Patient presents with  . Follow-up    Pt. is here to diabetes and hypertension.   . Back Pain    Pt. stated she is having back pain and shock in her feet.   . Medication Refill   [name] 42 y.o. female presents to office today for follow up to DM, HTN, HPL. She has complaints of right sided jaw pain today as well as chronic low back pain with sciatica.   Tooth Pain Onset a few days ago however she has very poor dentition and reports longstanding dental problems; specifically  on the right side. She endorses a previous history of abscess near the top right back molars. She has right sided jaw swelling and pain with chewing and upon opening her mouth.    Diabetes Mellitus Type 2 Chronic. Improving. She is checking her blood sugars at home with average readings: 100-200s. She has switched from regular sodas to diet sodas. She is losing weight. She has been out of her glipizide and lantus for a few weeks now. She denies any hypoglycemic symptoms but does endorse neuropathy in both feet. She has not made an appointment with the financial counselor. She is due for eye exam however she is uninsured.  She is taking an ACE inhibitor.  Lab Results  Component Value Date   HGBA1C 8.5 (A) 05/13/2018   Essential Hypertension Chronic. Not well controlled today however she has been out of her lisinopril for several weeks now. Denies chest pain, shortness of breath, palpitations, lightheadedness, dizziness, headaches or BLE edema. Will not make any adjustments with her antihypertensive today.  BP Readings from Last 3 Encounters:  05/13/18 (!) 142/90  04/07/18 122/75  02/25/18 116/77    Back Pain with sciatica Chronic and ongoing for many years now. Endorses sciatica to bilateral hips with new sensation of a "catch" in her left hip. Aggravating factors: bending, sitting or standing for prolonged periods of time. Pain is constant. Relieving factors: Motrin. She denies any involuntary loss of bowel or bladder.   Hyperlipidemia Patient presents for follow up to hyperlipidemia.  She is medication compliant. She is not diet compliant and denies skin xanthelasma or statin intolerance including myalgias.  Lab Results  Component Value Date   CHOL 284 (H) 12/12/2017   Lab Results  Component Value Date   HDL 45 12/12/2017   Lab Results  Component Value Date   LDLCALC 191 (H) 12/12/2017   Lab Results  Component Value Date   TRIG 240 (H) 12/12/2017   Lab Results  Component Value Date   CHOLHDL 6.3 (H) 12/12/2017     Review of Systems  Constitutional: Negative for fever, malaise/fatigue and weight loss.  HENT: Negative.  Negative for nosebleeds.        Tooth pain  Eyes: Negative.  Negative for blurred vision, double vision and photophobia.  Respiratory: Negative.  Negative for cough and shortness of breath.   Cardiovascular: Negative.  Negative for chest pain, palpitations and leg swelling.  Gastrointestinal: Positive for diarrhea (controlled with lomotil). Negative for heartburn, nausea and vomiting.  Musculoskeletal: Positive for back pain and myalgias. Negative for falls.    Neurological: Positive for sensory change (bilateral feet). Negative for dizziness, focal weakness, seizures and headaches.  Psychiatric/Behavioral: Negative.  Negative for suicidal ideas.    Past Medical History:  Diagnosis Date  . 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 Anxiety   . Asthma   . Diabetes mellitus without complication ([place])    type 2  . Hypertension   . Seasonal allergies     Past Surgical History:  Procedure Laterality Date  . TUBAL LIGATION  2009    Family History  Problem Relation Age of Onset  . Diabetes Mother   . Hypertension Mother   . Dementia Mother   . Diabetes Father   . Hypertension Father     Social History Reviewed with no changes to be made today.   Outpatient Medications Prior to Visit  Medication Sig Dispense Refill  . acetaminophen (TYLENOL) 500 MG tablet Take 500 mg by mouth every 6 (six) hours  as needed for headache.    . Blood Glucose Monitoring Suppl (TRUE METRIX METER) DEVI 1 each by Does not apply route 3 (three) times daily before meals. 1 Device 0  . diphenoxylate-atropine (LOMOTIL) 2.5-0.025 MG tablet Take 2 tablets by mouth 4 (four) times daily as needed for diarrhea or loose stools. 40 tablet 1  . glucose blood (TRUE METRIX BLOOD GLUCOSE TEST) test strip Use as directed 3 times daily before meals 100 each 12  . TRUEPLUS LANCETS 28G MISC 1 each by Does not apply route 3 (three) times daily before meals. 100 each 12  . glipiZIDE (GLUCOTROL) 10 MG tablet Take 1 tablet (10 mg total) by mouth daily before breakfast. 90 tablet 1  . Insulin Pen Needle (B-D UF III MINI PEN NEEDLES) 31G X 5 MM MISC Use as instructed 100 each 1  . lisinopril (PRINIVIL,ZESTRIL) 20 MG tablet Take 20 mg by mouth daily.     No facility-administered medications prior to visit.     Allergies  Allergen Reactions  . Lobster [Shellfish Allergy] Anaphylaxis    Just lobster   . Reglan [Metoclopramide]     akasthesia       Objective:    BP (!) 142/90 (BP Location: Right  Arm, Patient Position: Sitting, Cuff Size: Normal)   Pulse 82   Temp 98.2 F (36.8 C) (Oral)   Ht 5\' 7"  (1.702 m)   Wt 209 lb 9.6 oz (95.1 kg)   SpO2 96%   BMI 32.83 kg/m  Wt Readings from Last 3 Encounters:  05/13/18 209 lb 9.6 oz (95.1 kg)  04/07/18 219 lb (99.3 kg)  02/25/18 219 lb 12.8 oz (99.7 kg)    Physical Exam  Constitutional: She is oriented to person, place, and time. She appears well-developed and well-nourished. She is cooperative.  HENT:  Head: Normocephalic and atraumatic.  Mouth/Throat: Abnormal dentition. Dental caries present.  There is marked swelling and erythema of the upper right gum line with pain endorsed when pressure applied to right upper molars.   Eyes: EOM are normal.  Neck: Normal range of motion.  Cardiovascular: Normal rate, regular rhythm, normal heart sounds and intact distal pulses. Exam reveals no gallop and no friction rub.  No murmur heard. Pulmonary/Chest: Effort normal and breath sounds normal. No tachypnea. No respiratory distress. She has no decreased breath sounds. She has no wheezes. She has no rhonchi. She has no rales. She exhibits no tenderness.  Abdominal: Bowel sounds are normal.  Musculoskeletal: Normal range of motion. She exhibits no edema.       Lumbar back: She exhibits pain.  Neurological: She is alert and oriented to person, place, and time. Coordination normal.  Skin: Skin is warm and dry.  Psychiatric: She has a normal mood and affect. Her behavior is normal. Judgment and thought content normal.  Nursing note and vitals reviewed.      Patient has been counseled extensively about nutrition and exercise as well as the importance of adherence with medications and regular follow-up. The patient was given clear instructions to go to ER or return to medical center if symptoms don't improve, worsen or new problems develop. The patient verbalized understanding.   Follow-up: Return in about 3 months (around 08/13/2018).   Gildardo Pounds, FNP-BC [facility], [place]   05/13/2018, 12:56 PM

## 2018-05-14 ENCOUNTER — Other Ambulatory Visit: Payer: Self-pay | Admitting: Nurse Practitioner

## 2018-05-14 DIAGNOSIS — K047 Periapical abscess without sinus: Secondary | ICD-10-CM

## 2018-05-14 MED ORDER — CHLORHEXIDINE GLUCONATE 0.12 % MT SOLN: 15.0000 mL | Freq: Two times a day (BID) | OROMUCOSAL | 0 refills | Status: DC

## 2018-05-15 ENCOUNTER — Encounter: Payer: Self-pay | Admitting: Nurse Practitioner

## 2018-05-16 ENCOUNTER — Encounter: Payer: Self-pay | Admitting: Nurse Practitioner

## 2018-05-23 ENCOUNTER — Ambulatory Visit: Payer: Self-pay | Attending: Nurse Practitioner

## 2018-05-30 ENCOUNTER — Encounter: Payer: Self-pay | Admitting: Nurse Practitioner

## 2018-06-09 ENCOUNTER — Encounter: Payer: Self-pay | Admitting: Nurse Practitioner

## 2018-07-12 ENCOUNTER — Other Ambulatory Visit: Payer: Self-pay | Admitting: Nurse Practitioner

## 2018-07-12 ENCOUNTER — Encounter: Payer: Self-pay | Admitting: Nurse Practitioner

## 2018-07-12 DIAGNOSIS — K047 Periapical abscess without sinus: Secondary | ICD-10-CM

## 2018-07-14 MED ORDER — CHLORHEXIDINE GLUCONATE 0.12 % MT SOLN: 15.0000 mL | Freq: Two times a day (BID) | OROMUCOSAL | 0 refills | Status: DC

## 2018-08-19 ENCOUNTER — Ambulatory Visit: Payer: Self-pay | Admitting: Nurse Practitioner

## 2018-09-29 ENCOUNTER — Other Ambulatory Visit: Payer: Self-pay | Admitting: Nurse Practitioner

## 2018-09-29 DIAGNOSIS — E1165 Type 2 diabetes mellitus with hyperglycemia: Secondary | ICD-10-CM

## 2018-09-29 NOTE — Telephone Encounter (Signed)
Refill request

## 2018-09-30 NOTE — Telephone Encounter (Signed)
Patient had plenty of refills at the pharmacy, she hasn't refilled anything since June 2019 though, refill was submitted.

## 2018-10-17 ENCOUNTER — Encounter (HOSPITAL_COMMUNITY): Payer: Self-pay | Admitting: Emergency Medicine

## 2018-10-17 ENCOUNTER — Emergency Department (HOSPITAL_COMMUNITY)
Admission: EM | Admit: 2018-10-17 | Discharge: 2018-10-17 | Disposition: A | Discharge status: Home / Self Care | Payer: Self-pay | Attending: Emergency Medicine | Admitting: Emergency Medicine

## 2018-10-17 ENCOUNTER — Other Ambulatory Visit: Payer: Self-pay

## 2018-10-17 ENCOUNTER — Emergency Department (HOSPITAL_COMMUNITY): Payer: Self-pay

## 2018-10-17 DIAGNOSIS — Z87891 Personal history of nicotine dependence: Secondary | ICD-10-CM | POA: Insufficient documentation

## 2018-10-17 DIAGNOSIS — M5412 Radiculopathy, cervical region: Secondary | ICD-10-CM | POA: Insufficient documentation

## 2018-10-17 DIAGNOSIS — R110 Nausea: Secondary | ICD-10-CM | POA: Insufficient documentation

## 2018-10-17 DIAGNOSIS — Z79899 Other long term (current) drug therapy: Secondary | ICD-10-CM | POA: Insufficient documentation

## 2018-10-17 DIAGNOSIS — R202 Paresthesia of skin: Secondary | ICD-10-CM | POA: Insufficient documentation

## 2018-10-17 DIAGNOSIS — E119 Type 2 diabetes mellitus without complications: Secondary | ICD-10-CM | POA: Insufficient documentation

## 2018-10-17 DIAGNOSIS — I10 Essential (primary) hypertension: Secondary | ICD-10-CM | POA: Insufficient documentation

## 2018-10-17 DIAGNOSIS — J45909 Unspecified asthma, uncomplicated: Secondary | ICD-10-CM | POA: Insufficient documentation

## 2018-10-17 DIAGNOSIS — R1032 Left lower quadrant pain: Secondary | ICD-10-CM | POA: Insufficient documentation

## 2018-10-17 LAB — CBC WITH DIFFERENTIAL/PLATELET
Abs Immature Granulocytes: 0.04 10*3/uL (ref 0.00–0.07)
Basophils Absolute: 0 10*3/uL (ref 0.0–0.1)
Basophils Relative: 1 %
EOS ABS: 0.1 10*3/uL (ref 0.0–0.5)
EOS PCT: 2 %
HEMATOCRIT: 38.5 % (ref 36.0–46.0)
HEMOGLOBIN: 13.4 g/dL (ref 12.0–15.0)
Immature Granulocytes: 1 %
LYMPHS ABS: 1.7 10*3/uL (ref 0.7–4.0)
LYMPHS PCT: 29 %
MCH: 29.8 pg (ref 26.0–34.0)
MCHC: 34.8 g/dL (ref 30.0–36.0)
MCV: 85.6 fL (ref 80.0–100.0)
MONO ABS: 0.4 10*3/uL (ref 0.1–1.0)
Monocytes Relative: 7 %
Neutro Abs: 3.7 10*3/uL (ref 1.7–7.7)
Neutrophils Relative %: 60 %
Platelets: 277 10*3/uL (ref 150–400)
RBC: 4.5 MIL/uL (ref 3.87–5.11)
RDW: 12.2 % (ref 11.5–15.5)
WBC: 6.1 10*3/uL (ref 4.0–10.5)
nRBC: 0 % (ref 0.0–0.2)

## 2018-10-17 LAB — COMPREHENSIVE METABOLIC PANEL
ALBUMIN: 3.5 g/dL (ref 3.5–5.0)
ALT: 21 U/L (ref 0–44)
AST: 18 U/L (ref 15–41)
Alkaline Phosphatase: 110 U/L (ref 38–126)
Anion gap: 6 (ref 5–15)
BUN: 11 mg/dL (ref 6–20)
CHLORIDE: 106 mmol/L (ref 98–111)
CO2: 24 mmol/L (ref 22–32)
CREATININE: 0.52 mg/dL (ref 0.44–1.00)
Calcium: 8.9 mg/dL (ref 8.9–10.3)
GFR calc Af Amer: >60 mL/min (ref >60)
GFR calc non Af Amer: >60 mL/min (ref >60)
GLUCOSE: 221 mg/dL — AB (ref 70–99)
POTASSIUM: 3.8 mmol/L (ref 3.5–5.1)
SODIUM: 136 mmol/L (ref 135–145)
Total Bilirubin: 0.2 mg/dL — ABNORMAL LOW (ref 0.3–1.2)
Total Protein: 6.3 g/dL — ABNORMAL LOW (ref 6.5–8.1)

## 2018-10-17 LAB — URINALYSIS, ROUTINE W REFLEX MICROSCOPIC
BILIRUBIN URINE: NEGATIVE
Glucose, UA: >=500 mg/dL — AB
Hgb urine dipstick: NEGATIVE
KETONES UR: 5 mg/dL — AB
Nitrite: NEGATIVE
PROTEIN: NEGATIVE mg/dL
Specific Gravity, Urine: 1.025 (ref 1.005–1.030)
pH: 6 (ref 5.0–8.0)

## 2018-10-17 LAB — PREGNANCY, URINE: Preg Test, Ur: NEGATIVE

## 2018-10-17 LAB — LIPASE, BLOOD: LIPASE: 32 U/L (ref 11–51)

## 2018-10-17 MED ORDER — IOPAMIDOL (ISOVUE-300) INJECTION 61%
INTRAVENOUS | Status: AC
  Filled 2018-10-17: qty 100

## 2018-10-17 MED ORDER — IOPAMIDOL (ISOVUE-300) INJECTION 61%
100.0000 mL | Freq: Once | INTRAVENOUS | Status: AC | PRN
  Administered 2018-10-17: 100 mL via INTRAVENOUS

## 2018-10-17 MED ORDER — MORPHINE SULFATE (PF) 4 MG/ML IV SOLN
4.0000 mg | Freq: Once | INTRAVENOUS | Status: AC
  Administered 2018-10-17: 4 mg via INTRAVENOUS
  Filled 2018-10-17: qty 1

## 2018-10-17 MED ORDER — ONDANSETRON HCL 4 MG/2ML IJ SOLN
4.0000 mg | Freq: Once | INTRAMUSCULAR | Status: AC
  Administered 2018-10-17: 4 mg via INTRAVENOUS
  Filled 2018-10-17: qty 2

## 2018-10-17 MED ORDER — SODIUM CHLORIDE 0.9 % IV BOLUS
1000.0000 mL | Freq: Once | INTRAVENOUS | Status: AC
  Administered 2018-10-17: 1000 mL via INTRAVENOUS

## 2018-10-17 MED ORDER — SODIUM CHLORIDE (PF) 0.9 % IJ SOLN
INTRAMUSCULAR | Status: AC
  Filled 2018-10-17: qty 50

## 2018-10-17 MED ORDER — NAPROXEN 500 MG PO TABS: 500.0000 mg | ORAL_TABLET | Freq: Two times a day (BID) | ORAL | 0 refills | Status: DC

## 2018-10-17 NOTE — ED Triage Notes (Addendum)
Pt from home with c/o right arm numbness that began yesterday at 0400. No deficits noted during neuro assessment. Pt reported lower left abdominal pain that began yesterday. Pt denies emesis or urinary symptoms. Pt reports diarrhea this morning and nausea.

## 2018-10-17 NOTE — ED Provider Notes (Signed)
[facility] DEPT Provider Note   CSN: [id number] Arrival date & time: 10/17/18  2229     History   Chief Complaint Chief Complaint  Patient presents with  . Abdominal Pain  . Numbness    right arm    [name] is a 42 y.o. female.  Wendy Olson is a 42 y.o. Female with history of diabetes, hypertension, asthma and seasonal allergies, who presents to the emergency department for evaluation of 2 to 3 days of left-sided abdominal pain, most prevalent in the left lower quadrant.  Patient reports pain started as a dull ache that radiated back to her back but has since worsened.  She reports some associated nausea this morning and a few episodes of loose stools, no melena or hematochezia.  She has not had any vomiting.  Pain is a constant ache that intermittently worsens, she initially thought this was just a back spasm but it has not improved over the past few days.  She denies associated fevers or chills.  No dysuria or urinary frequency, no hematuria or flank pain.  No associated chest pain or shortness of breath.  She has not taken anything to treat her symptoms prior to arrival.  Patient also notes some decreased sensation in the right arm.  This is been present since early yesterday morning.  She reports that for the past 2 to 3 days she has had a dull aching pain that shoots through the right arm, starting at the right neck.  She denies any recent injury or trauma to the neck or arm.  Reports she is able to move the arm with some discomfort, and has some sensation in the arm but it feels decreased when compared to the left.  No similar symptoms in any of her other extremities.  Patient reports history of headaches, but no new or different headache associated with this arm pain, no new vision changes.  Patient does have history of diabetes and has some chronic neuropathy in both of her feet but otherwise no other neurologic issues.     Past  Medical History:  Diagnosis Date  . 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 Anxiety   . Asthma   . Diabetes mellitus without complication ([place])    type 2  . Hypertension   . Seasonal allergies     Patient Active Problem List   Diagnosis Date Noted  . Sore throat 12/30/2016  . Hypertension 12/14/2015  . Type 2 diabetes mellitus with hyperglycemia, without long-term current use of insulin ([place]) 11/01/2015    Past Surgical History:  Procedure Laterality Date  . TUBAL LIGATION  2009     OB History   None      Home Medications    Prior to Admission medications   Medication Sig Start Date End Date Taking? Authorizing Provider  acetaminophen (TYLENOL) 500 MG tablet Take 500 mg by mouth every 6 (six) hours as needed for headache.    [provider]  amoxicillin (AMOXIL) 500 MG capsule Take 1 capsule (500 mg total) by mouth 3 (three) times daily. 05/13/18   Gildardo Pounds, NP  atorvastatin (LIPITOR) 20 MG tablet Take 1 tablet (20 mg total) by mouth daily. 05/13/18   Gildardo Pounds, NP  Blood Glucose Monitoring Suppl (TRUE METRIX METER) DEVI 1 each by Does not apply route 3 (three) times daily before meals. 12/12/17   Alfonse Spruce, FNP  chlorhexidine (PERIDEX) 0.12 % solution Use as directed 15  mLs in the mouth or throat 2 (two) times daily. 07/14/18   Gildardo Pounds, NP  diphenoxylate-atropine (LOMOTIL) 2.5-0.025 MG tablet Take 2 tablets by mouth 4 (four) times daily as needed for diarrhea or loose stools. 02/25/18   Gildardo Pounds, NP  gabapentin (NEURONTIN) 300 MG capsule Take 1 capsule (300 mg total) by mouth 3 (three) times daily. 05/13/18   Gildardo Pounds, NP  glipiZIDE (GLUCOTROL) 10 MG tablet Take 1 tablet (10 mg total) by mouth daily before breakfast. 05/13/18   Gildardo Pounds, NP  glucose blood (TRUE METRIX BLOOD GLUCOSE TEST) test strip Use as directed 3 times daily before meals 01/08/18   Gildardo Pounds, NP  ibuprofen (ADVIL,MOTRIN) 800 MG tablet Take 1 tablet (800 mg  total) by mouth every 8 (eight) hours as needed. 05/13/18   Gildardo Pounds, NP  Insulin Glargine (LANTUS) 100 UNIT/ML Solostar Pen Inject 10 Units into the skin daily at 10 pm. 05/13/18 06/12/18  Gildardo Pounds, NP  Insulin Pen Needle (B-D UF III MINI PEN NEEDLES) 31G X 5 MM MISC Use as instructed 05/13/18   Gildardo Pounds, NP  lisinopril (PRINIVIL,ZESTRIL) 20 MG tablet Take 1 tablet (20 mg total) by mouth daily. 05/13/18   Gildardo Pounds, NP  TRUEPLUS LANCETS 28G MISC 1 each by Does not apply route 3 (three) times daily before meals. 12/12/17   Alfonse Spruce, FNP    Family History Family History  Problem Relation Age of Onset  . Diabetes Mother   . Hypertension Mother   . Dementia Mother   . Diabetes Father   . Hypertension Father     Social History Social History   Tobacco Use  . Smoking status: Former Smoker    Last attempt to quit: 01/25/2007    Years since quitting: 11.7  . Smokeless tobacco: Never Used  Substance Use Topics  . Alcohol use: No  . Drug use: No     Allergies   Lobster [shellfish allergy] and Reglan [metoclopramide]   Review of Systems Review of Systems  Constitutional: Negative for chills and fever.  HENT: Negative.   Eyes: Negative for visual disturbance.  Respiratory: Negative for cough and shortness of breath.   Cardiovascular: Negative for chest pain.  Gastrointestinal: Positive for abdominal pain, diarrhea and nausea. Negative for blood in stool, constipation and vomiting.  Genitourinary: Negative for dysuria, flank pain, frequency and hematuria.  Musculoskeletal: Positive for arthralgias, back pain, myalgias and neck pain.  Skin: Negative for color change and rash.  Neurological: Positive for numbness. Negative for dizziness, syncope, speech difficulty, weakness, light-headedness and headaches.  All other systems reviewed and are negative.    Physical Exam Updated Vital Signs BP (!) 143/88 (BP Location: Left Arm)   Pulse 78    Temp 98.4 F (36.9 C) (Oral)   Resp 18   LMP 09/09/2018   SpO2 100%   Physical Exam  Constitutional: She is oriented to person, place, and time. She appears well-developed and well-nourished. She does not appear ill. No distress.  HENT:  Head: Normocephalic and atraumatic.  Eyes: Right eye exhibits no discharge. Left eye exhibits no discharge.  Neck:  No focal midline tenderness over the C-spine but there is some tenderness over the right paraspinal muscles without overlying skin changes or palpable deformity  Cardiovascular: Normal rate, regular rhythm, normal heart sounds and intact distal pulses. Exam reveals no gallop.  No murmur heard. Pulmonary/Chest: Effort normal. No respiratory distress. She has no  wheezes. She has no rhonchi. She has no rales.  Respirations equal and unlabored, patient able to speak in full sentences, lungs clear to auscultation bilaterally  Abdominal: Soft. Normal appearance and bowel sounds are normal. She exhibits no distension. There is tenderness in the left upper quadrant and left lower quadrant. There is guarding. There is no rigidity, no rebound and no CVA tenderness.  Abdomen is soft, nondistended, bowel sounds present throughout, there is focal tenderness in the left upper lower quadrants with guarding across the left lower quadrant, right side of the abdomen is unremarkable, there is no rigidity or rebound tenderness, no CVA tenderness bilaterally.  Musculoskeletal:  Mild tenderness throughout the right arm, no overlying erythema, swelling or warmth.  Some pain with range of motion, decreased sensation when compared to left arm, but sensation is still present, 2+ radial pulse and good capillary refill, 5/5 strength throughout the right arm.  Neurological: She is alert and oriented to person, place, and time. No cranial nerve deficit. She exhibits normal muscle tone. Coordination normal.  Skin: Skin is warm and dry. Capillary refill takes less than 2  seconds. She is not diaphoretic.  Psychiatric: She has a normal mood and affect. Her behavior is normal.  Nursing note and vitals reviewed.    ED Treatments / Results  Labs (all labs ordered are listed, but only abnormal results are displayed) Labs Reviewed  COMPREHENSIVE METABOLIC PANEL - Abnormal; Notable for the following components:      Result Value   Glucose, Bld 221 (*)    Total Protein 6.3 (*)    Total Bilirubin 0.2 (*)    All other components within normal limits  URINALYSIS, ROUTINE W REFLEX MICROSCOPIC - Abnormal; Notable for the following components:   Glucose, UA >=500 (*)    Ketones, ur 5 (*)    Leukocytes, UA SMALL (*)    Bacteria, UA RARE (*)    All other components within normal limits  LIPASE, BLOOD  PREGNANCY, URINE  CBC WITH DIFFERENTIAL/PLATELET    EKG None  Radiology Ct Abdomen Pelvis W Contrast  Result Date: 10/17/2018 CLINICAL DATA:  Left lower quadrant pain. EXAM: CT ABDOMEN AND PELVIS WITH CONTRAST TECHNIQUE: Multidetector CT imaging of the abdomen and pelvis was performed using the standard protocol following bolus administration of intravenous contrast. CONTRAST:  159mL ISOVUE-300 IOPAMIDOL (ISOVUE-300) INJECTION 61% COMPARISON:  None. FINDINGS: Lower chest: No acute abnormality. Hepatobiliary: Diffuse low-density throughout the liver compatible with fatty infiltration. No focal abnormality. Gallbladder unremarkable. Pancreas: No focal abnormality or ductal dilatation. Spleen: No focal abnormality.  Normal size. Adrenals/Urinary Tract: No adrenal abnormality. No focal renal abnormality. No stones or hydronephrosis. Urinary bladder is unremarkable. Stomach/Bowel: Normal appendix. Stomach, large and small bowel grossly unremarkable. Vascular/Lymphatic: Aortic atherosclerosis. No enlarged abdominal or pelvic lymph nodes. Reproductive: Uterus and adnexa unremarkable.  No mass. Other: No free fluid or free air. Musculoskeletal: No acute bony abnormality  IMPRESSION: No acute findings in the abdomen or pelvis. Fatty infiltration of the liver. Aortic atherosclerosis. Electronically Signed   By: Rolm Baptise M.D.   On: 10/17/2018 09:08    Procedures Procedures (including critical care time)  Medications Ordered in ED Medications  iopamidol (ISOVUE-300) 61 % injection (  Hold 10/17/18 0840)  sodium chloride (PF) 0.9 % injection (0 mLs  Hold 10/17/18 0841)  sodium chloride 0.9 % bolus 1,000 mL (0 mLs Intravenous Stopped 10/17/18 0841)  ondansetron (ZOFRAN) injection 4 mg (4 mg Intravenous Given 10/17/18 0739)  morphine 4 MG/ML injection  4 mg (4 mg Intravenous Given 10/17/18 0739)  iopamidol (ISOVUE-300) 61 % injection 100 mL (100 mLs Intravenous Contrast Given 10/17/18 0846)     Initial Impression / Assessment and Plan / ED Course  I have reviewed the triage vital signs and the nursing notes.  Pertinent labs & imaging results that were available during my care of the patient were reviewed by me and considered in my medical decision making (see chart for details).  Patient presents for evaluation of left-sided abdominal pain with associated nausea, no vomiting, no melena or hematochezia, no fevers.  This is been present worsening for 2 to 3 days she initially thought it was a muscle cramp due to some pain she had been having in her back.  She also reports she has had 2 to 3 days of dull aching pain in her right arm with associated decreased sensation, no similar symptoms in any other extremities, no other neurologic symptoms.  On exam patient with normal vitals and appears to be in no acute distress.  She is focally tender in the left lower quadrant with some guarding, will get abdominal labs and CT abdomen pelvis.  Patient does have some decreased sensation over the right arm associated with pain with range of motion and she has some tenderness over the right paraspinal muscles, no sensory deficits in any other extremities, I suspect cervical  radiculopathy, she has 5/5 strength in this extremity, feel this is stable for outpatient work-up with her primary care doctor, will have her treat with anti-inflammatories, would like to avoid steroids given patient's diabetes is not tightly controlled.  Evaluation is overall reassuring, no leukocytosis and normal hemoglobin, no acute electrolyte derangements aside from a glucose of 221, normal renal and liver function and normal lipase, negative pregnancy, urinalysis small amount of leukocytes and a few WBCs and rare bacteria patient is not having any urinary symptoms, and has no CVA tenderness.  On reevaluation after fluids, pain medication and Zofran, patient reports significant improvement in her symptoms.  CT scan shows no evidence of acute on exam the abdomen or pelvis, there is some fatty infiltration of the liver.  Discussed reassuring CT scan with patient, on repeat exam she has no tenderness or guarding throughout the abdomen, no signs of a surgical abdomen.  Patient's pain and symptoms may be related to gas pains given few episodes of diarrhea versus muscle spasm extending from the back.  Will treat with anti-inflammatories and have patient follow-up with her primary care doctor.  I have discussed return precautions with the patient she expresses understanding and is in agreement with plan.  Stable for discharge home at this time.  Final Clinical Impressions(s) / ED Diagnoses   Final diagnoses:  Left lower quadrant abdominal pain  Nausea  Cervical radiculopathy  Paresthesia    ED Discharge Orders         Ordered    naproxen (NAPROSYN) 500 MG tablet  2 times daily     10/17/18 0933           Jacqlyn Larsen, PA-C 10/17/18 [id number]    Virgel Manifold, MD 10/17/18 [phone]

## 2018-10-17 NOTE — Discharge Instructions (Signed)
Your evaluation today is overall reassuring, CT scan does not show any acute problem in your abdomen, this may be due to muscle spasm, or gas pains.  The pain and decreased sensation in her right arm is likely due to a cervical radiculopathy, I would like for you to follow-up with your primary care doctor regarding this for further evaluation, but in the meantime please take Aleve twice daily, you can also use over-the-counter salon pas to cane patches to help with pain.  Return to the emergency department for fevers, worsening abdominal pain, persistent vomiting, blood in the stools, weakness of the right arm or numbness or weakness in any of your other extremities or any other new or concerning symptoms.

## 2018-10-17 NOTE — ED Notes (Signed)
Patient transported to CT 

## 2019-03-01 ENCOUNTER — Encounter (HOSPITAL_COMMUNITY): Payer: Self-pay | Admitting: *Deleted

## 2019-03-01 ENCOUNTER — Emergency Department (HOSPITAL_COMMUNITY)
Admission: EM | Admit: 2019-03-01 | Discharge: 2019-03-01 | Disposition: A | Discharge status: Home / Self Care | Payer: Self-pay | Attending: Emergency Medicine | Admitting: Emergency Medicine

## 2019-03-01 ENCOUNTER — Other Ambulatory Visit: Payer: Self-pay

## 2019-03-01 DIAGNOSIS — R739 Hyperglycemia, unspecified: Secondary | ICD-10-CM

## 2019-03-01 DIAGNOSIS — J069 Acute upper respiratory infection, unspecified: Secondary | ICD-10-CM

## 2019-03-01 DIAGNOSIS — Z794 Long term (current) use of insulin: Secondary | ICD-10-CM | POA: Insufficient documentation

## 2019-03-01 DIAGNOSIS — B9789 Other viral agents as the cause of diseases classified elsewhere: Secondary | ICD-10-CM | POA: Insufficient documentation

## 2019-03-01 DIAGNOSIS — E119 Type 2 diabetes mellitus without complications: Secondary | ICD-10-CM | POA: Insufficient documentation

## 2019-03-01 DIAGNOSIS — I10 Essential (primary) hypertension: Secondary | ICD-10-CM

## 2019-03-01 DIAGNOSIS — Z79899 Other long term (current) drug therapy: Secondary | ICD-10-CM | POA: Insufficient documentation

## 2019-03-01 DIAGNOSIS — Z87891 Personal history of nicotine dependence: Secondary | ICD-10-CM | POA: Insufficient documentation

## 2019-03-01 LAB — CBG MONITORING, ED: Glucose-Capillary: 257 mg/dL — ABNORMAL HIGH (ref 70–99)

## 2019-03-01 MED ORDER — METFORMIN HCL 500 MG PO TABS: 500.0000 mg | ORAL_TABLET | Freq: Two times a day (BID) | ORAL | 0 refills | Status: DC

## 2019-03-01 MED ORDER — AMLODIPINE BESYLATE 5 MG PO TABS: 5.0000 mg | ORAL_TABLET | Freq: Every day | ORAL | 0 refills | Status: DC

## 2019-03-01 MED ORDER — GLIPIZIDE 5 MG PO TABS: 5.0000 mg | ORAL_TABLET | Freq: Every day | ORAL | 0 refills | Status: DC

## 2019-03-01 NOTE — ED Triage Notes (Signed)
Sore throat x 2 days, cough and headache started last night, chills. Diarrhea. Weakness with [facility]

## 2019-03-01 NOTE — Discharge Instructions (Addendum)
It was our pleasure to provide your ER care today - we hope that you feel better.  Take diabetes meds as prescribed. Follow diabetic meal plan. Check sugars 4x/day and record values. Follow up with primary care doctor in the next couple weeks.   Take blood pressure medication as prescribed - follow up with primary care doctor in next couple weeks.   For recent symptoms, quarantine self at home for atleast 7 days - see informational sheet.

## 2019-03-01 NOTE — ED Provider Notes (Addendum)
[facility] DEPT Provider Note   CSN: [id number] Arrival date & time: 03/01/19  1823    History   Chief Complaint Chief Complaint  Patient presents with  . Cough  . Sore Throat  . Headache    [name] is a 43 y.o. female.     Patient c/o 2 day history scratchy throat, runny nose, non prod cough, and 1-2 loose stools. Symptoms acute onset 2 days ago, symptoms mild-mod, persistent. No chest pain or sob. No abd pain or vomiting. No fevers. States felt chills. No known covid contact or recent travel. No rash. Hx htn and dm, states out of meds x months. No recent wt loss or polyuria/polydipsia.   The history is provided by the patient.  Cough  Associated symptoms: headaches and sore throat   Associated symptoms: no chest pain, no fever, no rash and no shortness of breath   Sore Throat  Associated symptoms include headaches. Pertinent negatives include no chest pain, no abdominal pain and no shortness of breath.  Headache  Associated symptoms: congestion, cough, diarrhea and sore throat   Associated symptoms: no abdominal pain, no back pain, no fever, no neck pain, no neck stiffness and no vomiting     Past Medical History:  Diagnosis Date  . 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 Anxiety   . Asthma   . Diabetes mellitus without complication ([place])    type 2  . Hypertension   . Seasonal allergies     Patient Active Problem List   Diagnosis Date Noted  . Sore throat 12/30/2016  . Hypertension 12/14/2015  . Type 2 diabetes mellitus with hyperglycemia, without long-term current use of insulin ([place]) 11/01/2015    Past Surgical History:  Procedure Laterality Date  . TUBAL LIGATION  2009     OB History   No obstetric history on file.      Home Medications    Prior to Admission medications   Medication Sig Start Date End Date Taking? Authorizing Provider  acetaminophen (TYLENOL) 325 MG tablet Take 650 mg by mouth every 6 (six) hours as  needed for mild pain or headache.    [provider]  amoxicillin (AMOXIL) 500 MG capsule Take 1 capsule (500 mg total) by mouth 3 (three) times daily. Patient not taking: Reported on 10/17/2018 05/13/18   Gildardo Pounds, NP  atorvastatin (LIPITOR) 20 MG tablet Take 1 tablet (20 mg total) by mouth daily. 05/13/18   Gildardo Pounds, NP  Blood Glucose Monitoring Suppl (TRUE METRIX METER) DEVI 1 each by Does not apply route 3 (three) times daily before meals. 12/12/17   Alfonse Spruce, FNP  chlorhexidine (PERIDEX) 0.12 % solution Use as directed 15 mLs in the mouth or throat 2 (two) times daily. Patient not taking: Reported on 10/17/2018 07/14/18   Gildardo Pounds, NP  diphenoxylate-atropine (LOMOTIL) 2.5-0.025 MG tablet Take 2 tablets by mouth 4 (four) times daily as needed for diarrhea or loose stools. Patient not taking: Reported on 10/17/2018 02/25/18   Gildardo Pounds, NP  gabapentin (NEURONTIN) 300 MG capsule Take 1 capsule (300 mg total) by mouth 3 (three) times daily. 05/13/18   Gildardo Pounds, NP  glipiZIDE (GLUCOTROL) 10 MG tablet Take 1 tablet (10 mg total) by mouth daily before breakfast. 05/13/18   Gildardo Pounds, NP  glucose blood (TRUE METRIX BLOOD GLUCOSE TEST) test strip Use as directed 3 times daily before meals 01/08/18   Gildardo Pounds,  NP  hydroxypropyl methylcellulose / hypromellose (ISOPTO TEARS / GONIOVISC) 2.5 % ophthalmic solution Place 2 drops into both eyes as needed for dry eyes.    [provider]  ibuprofen (ADVIL,MOTRIN) 800 MG tablet Take 1 tablet (800 mg total) by mouth every 8 (eight) hours as needed. Patient not taking: Reported on 10/17/2018 05/13/18   Gildardo Pounds, NP  Insulin Glargine (LANTUS) 100 UNIT/ML Solostar Pen Inject 10 Units into the skin daily at 10 pm. 05/13/18 10/17/18  Gildardo Pounds, NP  Insulin Pen Needle (B-D UF III MINI PEN NEEDLES) 31G X 5 MM MISC Use as instructed 05/13/18   Gildardo Pounds, NP  lisinopril  (PRINIVIL,ZESTRIL) 20 MG tablet Take 1 tablet (20 mg total) by mouth daily. 05/13/18   Gildardo Pounds, NP  naproxen (NAPROSYN) 500 MG tablet Take 1 tablet (500 mg total) by mouth 2 (two) times daily. 10/17/18   Jacqlyn Larsen, PA-C  TRUEPLUS LANCETS 28G MISC 1 each by Does not apply route 3 (three) times daily before meals. 12/12/17   Alfonse Spruce, FNP    Family History Family History  Problem Relation Age of Onset  . Diabetes Mother   . Hypertension Mother   . Dementia Mother   . Diabetes Father   . Hypertension Father     Social History Social History   Tobacco Use  . Smoking status: Former Smoker    Last attempt to quit: 01/25/2007    Years since quitting: 12.1  . Smokeless tobacco: Never Used  Substance Use Topics  . Alcohol use: No  . Drug use: No     Allergies   Lobster [shellfish allergy] and Reglan [metoclopramide]   Review of Systems Review of Systems  Constitutional: Negative for fever.  HENT: Positive for congestion and sore throat.   Eyes: Negative for redness.  Respiratory: Positive for cough. Negative for shortness of breath.   Cardiovascular: Negative for chest pain.  Gastrointestinal: Positive for diarrhea. Negative for abdominal pain and vomiting.  Endocrine: Negative for polyuria.  Genitourinary: Negative for dysuria and flank pain.  Musculoskeletal: Negative for back pain, neck pain and neck stiffness.  Skin: Negative for rash.  Neurological: Positive for headaches.  Hematological: Does not bruise/bleed easily.  Psychiatric/Behavioral: Negative for confusion.     Physical Exam Updated Vital Signs BP (!) 136/113 (BP Location: Left Arm)   Pulse (!) 102   Temp 98.4 F (36.9 C) (Oral)   Resp 18   Ht 1.702 m (5\' 7" )   Wt 94.8 kg   SpO2 98%   BMI 32.73 kg/m   Physical Exam Vitals signs and nursing note reviewed.  Constitutional:      Appearance: Normal appearance. She is well-developed.  HENT:     Head: Atraumatic.     Right  Ear: Tympanic membrane normal.     Left Ear: Tympanic membrane normal.     Nose: Nose normal.     Mouth/Throat:     Mouth: Mucous membranes are moist.  Eyes:     General: No scleral icterus.    Conjunctiva/sclera: Conjunctivae normal.     Pupils: Pupils are equal, round, and reactive to light.  Neck:     Musculoskeletal: Normal range of motion and neck supple. No neck rigidity or muscular tenderness.     Trachea: No tracheal deviation.  Cardiovascular:     Rate and Rhythm: Normal rate and regular rhythm.     Pulses: Normal pulses.     Heart sounds: Normal  heart sounds. No murmur. No friction rub. No gallop.   Pulmonary:     Effort: Pulmonary effort is normal. No respiratory distress.     Breath sounds: Normal breath sounds.  Abdominal:     General: Bowel sounds are normal. There is no distension.     Palpations: Abdomen is soft.     Tenderness: There is no abdominal tenderness. There is no guarding.  Genitourinary:    Comments: No cva tenderness.  Musculoskeletal:        General: No swelling.  Skin:    General: Skin is warm and dry.     Findings: No rash.  Neurological:     Mental Status: She is alert.     Comments: Alert, speech normal.   Psychiatric:        Mood and Affect: Mood normal.      ED Treatments / Results  Labs (all labs ordered are listed, but only abnormal results are displayed) Results for orders placed or performed during the hospital encounter of 03/01/19  POC CBG, ED  Result Value Ref Range   Glucose-Capillary 257 (H) 70 - 99 mg/dL    EKG None  Radiology No results found.  Procedures Procedures (including critical care time)  Medications Ordered in ED Medications - No data to display   Initial Impression / Assessment and Plan / ED Course  I have reviewed the triage vital signs and the nursing notes.  Pertinent labs & imaging results that were available during my care of the patient were reviewed by me and considered in my medical  decision making (see chart for details).  Reviewed nursing notes and prior charts for additional history.   Hx dm, but off meds x months, will check cbg.  Wendy Olson was evaluated in Emergency Department on 03/01/2019 for the symptoms described in the history of present illness. She was evaluated in the context of the global COVID-19 pandemic, which necessitated consideration that the patient might be at risk for infection with the SARS-CoV-2 virus that causes COVID-19. Institutional protocols and algorithms that pertain to the evaluation of patients at risk for COVID-19 are in a state of rapid change based on information released by regulatory bodies including the CDC and federal and state organizations. These policies and algorithms were followed during the patient's care in the ED.  Give recent symptoms, discussed home quarantine w pt.   Pt requests work note.   Labs reviewed - glucose 257, mod elev. No polyuria, polydipsia or other symptoms.  Confirmed only med allergy is reglan. Will restart pts metformin and glipizide (note that patient has been off all diabetes meds, including glipizide and insulin x several months). Recheck bp elev, will give rx.   Pt currently appears stable for d/c.     Final Clinical Impressions(s) / ED Diagnoses   Final diagnoses:  None    ED Discharge Orders    None           Lajean Saver, MD 03/01/19 1942

## 2019-05-25 ENCOUNTER — Other Ambulatory Visit: Payer: Self-pay

## 2019-05-25 ENCOUNTER — Encounter (HOSPITAL_COMMUNITY): Payer: Self-pay | Admitting: Student

## 2019-05-25 ENCOUNTER — Emergency Department (HOSPITAL_COMMUNITY)
Admission: EM | Admit: 2019-05-25 | Discharge: 2019-05-25 | Disposition: A | Discharge status: Home / Self Care | Payer: Self-pay | Attending: Emergency Medicine | Admitting: Emergency Medicine

## 2019-05-25 ENCOUNTER — Emergency Department (HOSPITAL_COMMUNITY): Payer: Self-pay

## 2019-05-25 DIAGNOSIS — Z794 Long term (current) use of insulin: Secondary | ICD-10-CM | POA: Insufficient documentation

## 2019-05-25 DIAGNOSIS — R072 Precordial pain: Secondary | ICD-10-CM | POA: Insufficient documentation

## 2019-05-25 DIAGNOSIS — Z79899 Other long term (current) drug therapy: Secondary | ICD-10-CM | POA: Insufficient documentation

## 2019-05-25 DIAGNOSIS — E119 Type 2 diabetes mellitus without complications: Secondary | ICD-10-CM | POA: Insufficient documentation

## 2019-05-25 DIAGNOSIS — R079 Chest pain, unspecified: Secondary | ICD-10-CM

## 2019-05-25 DIAGNOSIS — Z87891 Personal history of nicotine dependence: Secondary | ICD-10-CM | POA: Insufficient documentation

## 2019-05-25 DIAGNOSIS — I10 Essential (primary) hypertension: Secondary | ICD-10-CM | POA: Insufficient documentation

## 2019-05-25 DIAGNOSIS — J45909 Unspecified asthma, uncomplicated: Secondary | ICD-10-CM | POA: Insufficient documentation

## 2019-05-25 LAB — BASIC METABOLIC PANEL
Anion gap: 11 (ref 5–15)
BUN: 7 mg/dL (ref 6–20)
CO2: 20 mmol/L — ABNORMAL LOW (ref 22–32)
Calcium: 8.6 mg/dL — ABNORMAL LOW (ref 8.9–10.3)
Chloride: 105 mmol/L (ref 98–111)
Creatinine, Ser: 0.6 mg/dL (ref 0.44–1.00)
GFR calc Af Amer: >60 mL/min (ref >60)
GFR calc non Af Amer: >60 mL/min (ref >60)
Glucose, Bld: 271 mg/dL — ABNORMAL HIGH (ref 70–99)
Potassium: 3.6 mmol/L (ref 3.5–5.1)
Sodium: 136 mmol/L (ref 135–145)

## 2019-05-25 LAB — CBC
HCT: 37.8 % (ref 36.0–46.0)
Hemoglobin: 13.4 g/dL (ref 12.0–15.0)
MCH: 30.5 pg (ref 26.0–34.0)
MCHC: 35.4 g/dL (ref 30.0–36.0)
MCV: 85.9 fL (ref 80.0–100.0)
Platelets: 279 10*3/uL (ref 150–400)
RBC: 4.4 MIL/uL (ref 3.87–5.11)
RDW: 12 % (ref 11.5–15.5)
WBC: 5.7 10*3/uL (ref 4.0–10.5)
nRBC: 0 % (ref 0.0–0.2)

## 2019-05-25 LAB — I-STAT BETA HCG BLOOD, ED (MC, WL, AP ONLY): I-stat hCG, quantitative: <5 m[IU]/mL (ref <5)

## 2019-05-25 LAB — D-DIMER, QUANTITATIVE (NOT AT ARMC): D-Dimer, Quant: 0.35 ug/mL-FEU (ref 0.00–0.50)

## 2019-05-25 LAB — TROPONIN I (HIGH SENSITIVITY)
Troponin I (High Sensitivity): <2 ng/L (ref <18)
Troponin I (High Sensitivity): 3 ng/L (ref <18)

## 2019-05-25 MED ORDER — MORPHINE SULFATE (PF) 4 MG/ML IV SOLN
4.0000 mg | Freq: Once | INTRAVENOUS | Status: AC
  Administered 2019-05-25: 4 mg via INTRAVENOUS
  Filled 2019-05-25: qty 1

## 2019-05-25 MED ORDER — ONDANSETRON HCL 4 MG/2ML IJ SOLN
4.0000 mg | Freq: Once | INTRAMUSCULAR | Status: AC
  Administered 2019-05-25: 08:00:00 4 mg via INTRAVENOUS
  Filled 2019-05-25: qty 2

## 2019-05-25 NOTE — ED Triage Notes (Signed)
Pt arrives to ED from home with complaints of substernal non radiating chest pain since yesterday. Pt received x1 nitro and 324mg  Asprin via EMS with little chest pain relief.

## 2019-05-25 NOTE — ED Notes (Signed)
Patient verbalizes understanding of discharge instructions. Opportunity for questioning and answers were provided. Armband removed by staff, pt discharged from ED.  

## 2019-05-25 NOTE — Discharge Instructions (Signed)
You were seen in the emergency department today for chest pain. Your work-up in the emergency department has been overall reassuring. Your labs have been fairly normal and or similar to previous blood work you have had done. Your blood sugar was high- be sure to have this repeated by primary care. Your EKG and the enzyme we use to check your heart did not show an acute heart attack at this time. Your chest x-ray was normal.   We would like you to follow up closely with your primary care provider and/or the cardiologist provided in your discharge instructions within 1-3 days. Return to the ER immediately should you experience any new or worsening symptoms including but not limited to return of pain, worsened pain, vomiting, shortness of breath, dizziness, lightheadedness, passing out, or any other concerns that you may have.

## 2019-05-25 NOTE — ED Provider Notes (Signed)
[facility] EMERGENCY DEPARTMENT Provider Note   CSN: [id number] Arrival date & time: 05/25/19  6712     History   Chief Complaint Chief Complaint  Patient presents with  . Chest Pain    [name] is a 43 y.o. female with a hx of asthma, anxiety, insulin dependent T2DM, HTN,& hyperlipidemia who presents to the ED via EMS w/ complaints of chest pain that began @ 13:00 yesterday afternoon. Patient states pain is located in the substernal area, at times radiates to the jaw, it is constant, worse with a deep breath & with exertion, was mildly alleviated by ASA & NTG given by EMS en route. Current pain is a 6/10 in severity. She notes the discomfort feels sharp & like heaviness. Associated sxs include nausea, diaphoresis, dyspnea & intermittent lightheadedness. Denies personal hx of CAD. States she has unknown early family heart hx but does not think this was an MI. Denies syncope, leg pain/swelling, hemoptysis, recent surgery/trauma, recent long travel, hormone use, personal hx of cancer, or hx of DVT/PE.        HPI  Past Medical History:  Diagnosis Date  . 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 Anxiety   . Asthma   . Diabetes mellitus without complication ([place])    type 2  . Hypertension   . Seasonal allergies     Patient Active Problem List   Diagnosis Date Noted  . Sore throat 12/30/2016  . Hypertension 12/14/2015  . Type 2 diabetes mellitus with hyperglycemia, without long-term current use of insulin ([place]) 11/01/2015    Past Surgical History:  Procedure Laterality Date  . TUBAL LIGATION  2009     OB History   No obstetric history on file.      Home Medications    Prior to Admission medications   Medication Sig Start Date End Date Taking? Authorizing Provider  acetaminophen (TYLENOL) 325 MG tablet Take 650 mg by mouth every 6 (six) hours as needed for mild pain or headache.    [provider]  amLODipine (NORVASC) 5 MG tablet Take 1 tablet  (5 mg total) by mouth daily. 03/01/19   Lajean Saver, MD  amoxicillin (AMOXIL) 500 MG capsule Take 1 capsule (500 mg total) by mouth 3 (three) times daily. Patient not taking: Reported on 10/17/2018 05/13/18   Gildardo Pounds, NP  atorvastatin (LIPITOR) 20 MG tablet Take 1 tablet (20 mg total) by mouth daily. 05/13/18   Gildardo Pounds, NP  Blood Glucose Monitoring Suppl (TRUE METRIX METER) DEVI 1 each by Does not apply route 3 (three) times daily before meals. 12/12/17   Alfonse Spruce, FNP  chlorhexidine (PERIDEX) 0.12 % solution Use as directed 15 mLs in the mouth or throat 2 (two) times daily. Patient not taking: Reported on 10/17/2018 07/14/18   Gildardo Pounds, NP  diphenoxylate-atropine (LOMOTIL) 2.5-0.025 MG tablet Take 2 tablets by mouth 4 (four) times daily as needed for diarrhea or loose stools. Patient not taking: Reported on 10/17/2018 02/25/18   Gildardo Pounds, NP  gabapentin (NEURONTIN) 300 MG capsule Take 1 capsule (300 mg total) by mouth 3 (three) times daily. 05/13/18   Gildardo Pounds, NP  glipiZIDE (GLUCOTROL) 10 MG tablet Take 1 tablet (10 mg total) by mouth daily before breakfast. 05/13/18   Gildardo Pounds, NP  glipiZIDE (GLUCOTROL) 5 MG tablet Take 1 tablet (5 mg total) by mouth daily before breakfast. 03/01/19   Lajean Saver, MD  glucose blood (TRUE  METRIX BLOOD GLUCOSE TEST) test strip Use as directed 3 times daily before meals 01/08/18   Gildardo Pounds, NP  hydroxypropyl methylcellulose / hypromellose (ISOPTO TEARS / GONIOVISC) 2.5 % ophthalmic solution Place 2 drops into both eyes as needed for dry eyes.    [provider]  ibuprofen (ADVIL,MOTRIN) 800 MG tablet Take 1 tablet (800 mg total) by mouth every 8 (eight) hours as needed. Patient not taking: Reported on 10/17/2018 05/13/18   Gildardo Pounds, NP  Insulin Glargine (LANTUS) 100 UNIT/ML Solostar Pen Inject 10 Units into the skin daily at 10 pm. 05/13/18 10/17/18  Gildardo Pounds, NP  Insulin Pen Needle  (B-D UF III MINI PEN NEEDLES) 31G X 5 MM MISC Use as instructed 05/13/18   Gildardo Pounds, NP  lisinopril (PRINIVIL,ZESTRIL) 20 MG tablet Take 1 tablet (20 mg total) by mouth daily. 05/13/18   Gildardo Pounds, NP  metFORMIN (GLUCOPHAGE) 500 MG tablet Take 1 tablet (500 mg total) by mouth 2 (two) times daily with a meal. 03/01/19   Lajean Saver, MD  naproxen (NAPROSYN) 500 MG tablet Take 1 tablet (500 mg total) by mouth 2 (two) times daily. 10/17/18   Jacqlyn Larsen, PA-C  TRUEPLUS LANCETS 28G MISC 1 each by Does not apply route 3 (three) times daily before meals. 12/12/17   Alfonse Spruce, FNP    Family History Family History  Problem Relation Age of Onset  . Diabetes Mother   . Hypertension Mother   . Dementia Mother   . Diabetes Father   . Hypertension Father     Social History Social History   Tobacco Use  . Smoking status: Former Smoker    Quit date: 01/25/2007    Years since quitting: 12.3  . Smokeless tobacco: Never Used  Substance Use Topics  . Alcohol use: No  . Drug use: No     Allergies   Lobster [shellfish allergy] and Reglan [metoclopramide]   Review of Systems Review of Systems  Constitutional: Negative for chills and fever.  Respiratory: Positive for shortness of breath. Negative for cough.   Cardiovascular: Positive for chest pain. Negative for leg swelling.  Gastrointestinal: Positive for nausea. Negative for abdominal pain, constipation and vomiting.  Genitourinary: Negative for dysuria.  Neurological: Positive for light-headedness (intermittent). Negative for dizziness and syncope.  All other systems reviewed and are negative.   Physical Exam Updated Vital Signs BP 134/84 (BP Location: Right Arm)   Pulse 76   Temp 98.1 F (36.7 C) (Oral)   Resp (!) 24   Ht 5\' 7"  (1.702 m)   Wt 97.5 kg   SpO2 97%   BMI 33.67 kg/m   Physical Exam Vitals signs and nursing note reviewed.  Constitutional:      General: She is not in acute distress.     Appearance: She is well-developed. She is not toxic-appearing.  HENT:     Head: Normocephalic and atraumatic.  Eyes:     General:        Right eye: No discharge.        Left eye: No discharge.     Conjunctiva/sclera: Conjunctivae normal.  Neck:     Musculoskeletal: Neck supple.  Cardiovascular:     Rate and Rhythm: Normal rate and regular rhythm.     Pulses:          Radial pulses are 2+ on the right side and 2+ on the left side.     Heart sounds: No murmur. No  friction rub. No gallop.   Pulmonary:     Effort: Pulmonary effort is normal. No respiratory distress.     Breath sounds: Normal breath sounds. No wheezing, rhonchi or rales.  Chest:     Chest wall: Tenderness (sternal area without overlying skin changes or palpable crepitus) present.  Abdominal:     General: There is no distension.     Palpations: Abdomen is soft.     Tenderness: There is no abdominal tenderness.  Musculoskeletal:     Right lower leg: She exhibits no tenderness. No edema.     Left lower leg: She exhibits no tenderness. No edema.  Skin:    General: Skin is warm and dry.     Findings: No rash.  Neurological:     Mental Status: She is alert.     Comments: Clear speech.   Psychiatric:        Behavior: Behavior normal.    ED Treatments / Results  Labs (all labs ordered are listed, but only abnormal results are displayed) Labs Reviewed  BASIC METABOLIC PANEL - Abnormal; Notable for the following components:      Result Value   CO2 20 (*)    Glucose, Bld 271 (*)    Calcium 8.6 (*)    All other components within normal limits  CBC  TROPONIN I (HIGH SENSITIVITY)  TROPONIN I (HIGH SENSITIVITY)  D-DIMER, QUANTITATIVE (NOT AT [facility])  I-STAT BETA HCG BLOOD, ED ([facility], [facility], [facility] ONLY)    EKG   EKG Interpretation  Date/Time:  Monday May 25 2019 07:45:32 EDT Ventricular Rate:  70 PR Interval:  156 QRS Duration: 90 QT Interval:  432 QTC Calculation: 466 R Axis:   70 Text Interpretation:  Normal sinus  rhythm Normal ECG No STEMI  Confirmed by Nanda Quinton [phone]) on 05/25/2019 7:57:54 AM   Radiology Dg Chest Portable 1 View  Result Date: 05/25/2019 CLINICAL DATA:  Patient reports sob and center chest pains started yesterday while at work. Cough only when loses her breath. Nausea and diarrhea. Denies any prior heart or lung surgeries. Hx of asthma, htn, diabetes. Ex smoker quit 11 years ago EXAM: PORTABLE CHEST - 1 VIEW COMPARISON:  02/14/2018 FINDINGS: Lungs are clear. Heart size and mediastinal contours are within normal limits. No effusion.  No pneumothorax. Visualized bones unremarkable. IMPRESSION: No acute cardiopulmonary disease. Electronically Signed   By: Lucrezia Europe M.D.   On: 05/25/2019 08:00    Procedures Procedures (including critical care time)  Medications Ordered in ED Medications - No data to display   Initial Impression / Assessment and Plan / ED Course  I have reviewed the triage vital signs and the nursing notes.  Pertinent labs & imaging results that were available during my care of the patient were reviewed by me and considered in my medical decision making (see chart for details).    Patient presents to the emergency department with chest pain. Patient nontoxic appearing, in no apparent distress, vitals without significant abnormality. Fairly benign physical exam, some anterior chest wall tenderness. DDX: ACS, pulmonary embolism, dissection, pneumothorax, effusion, infiltrate, arrhythmia, anemia, electrolyte derangement, MSK,  Pleurisy, GERD, anxiety. Evaluation initiated with labs, EKG, and CXR. Patient on cardiac monitor.   Work-up in the ER reviewed:  CBC: No leukocytosis/anemia BMP: Hyperglycemia with mildly low bicarb, no anion gap elevation. Mild hypocalcemia, no significant electrolyte derangements.  Troponin: negative x 2.  EKG: No STEMI.  CXR:  Negative, without infiltrate, effusion, pneumothorax, or fracture/dislocation.   Patient is  low risk wells,  d-dimer negative, doubt pulmonary embolism. Pain is not a tearing sensation, symmetric pulses, no widening of mediastinum on CXR, doubt dissection. Cardiac monitor reviewed, no notable arrhythmias or tachycardia. Heart score of 4, EKG without obvious ischemia, delta troponin negative, per high sensitivity troponin algorithm will discharge home with close cardiology follow up- doubt ACS. Patient has appeared hemodynamically stable throughout ER visit and appears safe for discharge with close PCP/cardiology follow up, she is feeling much better @ time of discharge. I discussed results, treatment plan, need for follow-up, and return precautions with the patient. Provided opportunity for questions, patient confirmed understanding and is in agreement with plan.    Findings and plan of care discussed with supervising physician Dr. Laverta Baltimore who is in agreement.   Vitals:   05/25/19 1200 05/25/19 1215  BP: 116/75   Pulse:    Resp: 15 18  Temp:    SpO2:      Final Clinical Impressions(s) / ED Diagnoses   Final diagnoses:  Chest pain, unspecified type    ED Discharge Orders    None       Amaryllis Dyke, PA-C 05/25/19 1300    Margette Fast, MD 05/25/19 1907

## 2019-08-13 ENCOUNTER — Emergency Department (HOSPITAL_COMMUNITY)
Admission: EM | Admit: 2019-08-13 | Discharge: 2019-08-14 | Disposition: A | Discharge status: Home / Self Care | Payer: Self-pay | Attending: Emergency Medicine | Admitting: Emergency Medicine

## 2019-08-13 ENCOUNTER — Other Ambulatory Visit: Payer: Self-pay

## 2019-08-13 ENCOUNTER — Emergency Department (HOSPITAL_COMMUNITY): Payer: Self-pay

## 2019-08-13 ENCOUNTER — Encounter (HOSPITAL_COMMUNITY): Payer: Self-pay | Admitting: Pharmacy Technician

## 2019-08-13 DIAGNOSIS — R531 Weakness: Secondary | ICD-10-CM

## 2019-08-13 DIAGNOSIS — R739 Hyperglycemia, unspecified: Secondary | ICD-10-CM

## 2019-08-13 DIAGNOSIS — Z79899 Other long term (current) drug therapy: Secondary | ICD-10-CM | POA: Insufficient documentation

## 2019-08-13 DIAGNOSIS — M25552 Pain in left hip: Secondary | ICD-10-CM | POA: Insufficient documentation

## 2019-08-13 DIAGNOSIS — R471 Dysarthria and anarthria: Secondary | ICD-10-CM | POA: Insufficient documentation

## 2019-08-13 DIAGNOSIS — I10 Essential (primary) hypertension: Secondary | ICD-10-CM | POA: Insufficient documentation

## 2019-08-13 DIAGNOSIS — E1165 Type 2 diabetes mellitus with hyperglycemia: Secondary | ICD-10-CM | POA: Insufficient documentation

## 2019-08-13 DIAGNOSIS — Z794 Long term (current) use of insulin: Secondary | ICD-10-CM | POA: Insufficient documentation

## 2019-08-13 DIAGNOSIS — R29810 Facial weakness: Secondary | ICD-10-CM | POA: Insufficient documentation

## 2019-08-13 DIAGNOSIS — Z87891 Personal history of nicotine dependence: Secondary | ICD-10-CM | POA: Insufficient documentation

## 2019-08-13 LAB — DIFFERENTIAL
Abs Immature Granulocytes: 0.01 10*3/uL (ref 0.00–0.07)
Basophils Absolute: 0.1 10*3/uL (ref 0.0–0.1)
Basophils Relative: 1 %
Eosinophils Absolute: 0.2 10*3/uL (ref 0.0–0.5)
Eosinophils Relative: 2 %
Immature Granulocytes: 0 %
Lymphocytes Relative: 36 %
Lymphs Abs: 2.4 10*3/uL (ref 0.7–4.0)
Monocytes Absolute: 0.5 10*3/uL (ref 0.1–1.0)
Monocytes Relative: 7 %
Neutro Abs: 3.7 10*3/uL (ref 1.7–7.7)
Neutrophils Relative %: 54 %

## 2019-08-13 LAB — COMPREHENSIVE METABOLIC PANEL
ALT: 50 U/L — ABNORMAL HIGH (ref 0–44)
AST: 29 U/L (ref 15–41)
Albumin: 4.1 g/dL (ref 3.5–5.0)
Alkaline Phosphatase: 178 U/L — ABNORMAL HIGH (ref 38–126)
Anion gap: 13 (ref 5–15)
BUN: 11 mg/dL (ref 6–20)
CO2: 19 mmol/L — ABNORMAL LOW (ref 22–32)
Calcium: 9.4 mg/dL (ref 8.9–10.3)
Chloride: 101 mmol/L (ref 98–111)
Creatinine, Ser: 0.77 mg/dL (ref 0.44–1.00)
GFR calc Af Amer: >60 mL/min (ref >60)
GFR calc non Af Amer: >60 mL/min (ref >60)
Glucose, Bld: 488 mg/dL — ABNORMAL HIGH (ref 70–99)
Potassium: 3.9 mmol/L (ref 3.5–5.1)
Sodium: 133 mmol/L — ABNORMAL LOW (ref 135–145)
Total Bilirubin: 0.7 mg/dL (ref 0.3–1.2)
Total Protein: 7.4 g/dL (ref 6.5–8.1)

## 2019-08-13 LAB — I-STAT CHEM 8, ED
BUN: 13 mg/dL (ref 6–20)
Calcium, Ion: 1.11 mmol/L — ABNORMAL LOW (ref 1.15–1.40)
Chloride: 101 mmol/L (ref 98–111)
Creatinine, Ser: 0.5 mg/dL (ref 0.44–1.00)
Glucose, Bld: 497 mg/dL — ABNORMAL HIGH (ref 70–99)
HCT: 46 % (ref 36.0–46.0)
Hemoglobin: 15.6 g/dL — ABNORMAL HIGH (ref 12.0–15.0)
Potassium: 3.9 mmol/L (ref 3.5–5.1)
Sodium: 133 mmol/L — ABNORMAL LOW (ref 135–145)
TCO2: 18 mmol/L — ABNORMAL LOW (ref 22–32)

## 2019-08-13 LAB — CBG MONITORING, ED: Glucose-Capillary: 449 mg/dL — ABNORMAL HIGH (ref 70–99)

## 2019-08-13 LAB — CBC
HCT: 43.6 % (ref 36.0–46.0)
Hemoglobin: 16 g/dL — ABNORMAL HIGH (ref 12.0–15.0)
MCH: 31.4 pg (ref 26.0–34.0)
MCHC: 36.7 g/dL — ABNORMAL HIGH (ref 30.0–36.0)
MCV: 85.7 fL (ref 80.0–100.0)
Platelets: 325 10*3/uL (ref 150–400)
RBC: 5.09 MIL/uL (ref 3.87–5.11)
RDW: 11.9 % (ref 11.5–15.5)
WBC: 6.8 10*3/uL (ref 4.0–10.5)
nRBC: 0 % (ref 0.0–0.2)

## 2019-08-13 LAB — I-STAT BETA HCG BLOOD, ED (MC, WL, AP ONLY): I-stat hCG, quantitative: <5 m[IU]/mL (ref <5)

## 2019-08-13 LAB — APTT: aPTT: 27 seconds (ref 24–36)

## 2019-08-13 LAB — PROTIME-INR
INR: 0.9 (ref 0.8–1.2)
Prothrombin Time: 12.1 seconds (ref 11.4–15.2)

## 2019-08-13 MED ORDER — LORAZEPAM 2 MG/ML IJ SOLN
0.5000 mg | Freq: Once | INTRAMUSCULAR | Status: AC
  Administered 2019-08-13: 0.5 mg via INTRAVENOUS
  Filled 2019-08-13: qty 1

## 2019-08-13 MED ORDER — SODIUM CHLORIDE 0.9 % IV BOLUS
1000.0000 mL | Freq: Once | INTRAVENOUS | Status: AC
  Administered 2019-08-13: 1000 mL via INTRAVENOUS

## 2019-08-13 MED ORDER — INSULIN ASPART 100 UNIT/ML ~~LOC~~ SOLN
10.0000 [IU] | Freq: Once | SUBCUTANEOUS | Status: AC
  Administered 2019-08-13: 10 [IU] via INTRAVENOUS

## 2019-08-13 MED ORDER — SODIUM CHLORIDE 0.9% FLUSH: 3.0000 mL | Freq: Once | INTRAVENOUS | Status: DC

## 2019-08-13 NOTE — ED Notes (Signed)
Family at bedside. 

## 2019-08-13 NOTE — ED Notes (Signed)
Per MD Leonel Ramsay, cancel code stroke

## 2019-08-13 NOTE — Consult Note (Signed)
Neurology Consultation Reason for Consult: Difficulty with speech Referring Physician: Jeanell Olson, D  CC: Difficulty with speech  History is obtained from: Patient, EMS  HPI: Wendy Olson is a 43 y.o. female who is in the midst of an argument with her son when she abruptly began having trouble speaking and lay down on the floor and was not cooperative.  EMS was called and they found her to have difficulty speaking and generalized weakness and therefore a code stroke was activated.   LKW: 9:48 PM tpa given?: no, not a stroke    ROS:  Unable to obtain due to refusal to speak  Past Medical History:  Diagnosis Date  . 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 Anxiety   . Asthma   . Diabetes mellitus without complication ([place])    type 2  . Hypertension   . Seasonal allergies      Family History  Problem Relation Age of Onset  . Diabetes Mother   . Hypertension Mother   . Dementia Mother   . Diabetes Father   . Hypertension Father      Social History:  reports that she quit smoking about 12 years ago. She has never used smokeless tobacco. She reports that she does not drink alcohol or use drugs.   Exam: Current vital signs: BP (!) 164/107   Pulse 93   Temp (!) 97.4 F (36.3 C)   Resp (!) 22   Ht 5\' 2"  (1.575 m)   Wt 100.6 kg   SpO2 97%   BMI 40.56 kg/m  Vital signs in last 24 hours: Temp:  [97.4 F (36.3 C)] 97.4 F (36.3 C) (09/17 2242) Pulse Rate:  [93] 93 (09/17 2242) Resp:  [22] 22 (09/17 2242) BP: (164)/(107) 164/107 (09/17 2242) SpO2:  [97 %] 97 % (09/17 2242) Weight:  [100.6 kg] 100.6 kg (09/17 2242)   Physical Exam  Constitutional: Appears well-developed and well-nourished.  Psych: appears anxious Eyes: No scleral injection HENT: No OP obstrucion Head: Normocephalic.  Cardiovascular: Normal rate and regular rhythm.  Respiratory: Effort normal, non-labored breathing GI: Soft.  No distension. There is no tenderness.  Skin: WDI  Neuro: Mental Status: Patient is  awake, alert.  When  cranial Nerves: II: Visual Fields are full. Pupils are equal, round, and reactive to light.   III,IV, VI: EOMI without ptosis or diploplia.  V: splits midline to vibration on the forehead VII: se appears to have decreased movement on the right face, but when asked to puff air in her cheeks,  VIII: hearing is intact to voice X: Uvula elevates symmetrically XI: Shoulder shrug is symmetric. XII: tongue is midline without atrophy or fasciculations.  Motor: She has a markedly inconsistent exam.  She gave complete flaccidity in bilateral arms, but if I performed finger-nose-finger and stated "I would get it started for a you just have to continue it" she was able to perform finger-nose-finger against gravity without difficulty.  She gave flaccidity in her lower extremities as well when she was asked to perform strength testing, however when lifting her bottom to help get her pants off, she is able to bend her knees and sustain her right leg against gravity without difficulty. Sensory: She endorses lack of sensation on the left. Cerebellar: No clear ataxia on finger-nose-finger, though she does have significant psychogenic wobble which is distractible.   I have reviewed labs in epic and the results pertinent to this consultation are: Elevated glucose  I have reviewed the images obtained: CT  head-negative  Impression: 43 yo F with multiple psychogenic findings on exam which started in the midst of an argument.  At this point, her findings are clearly psychogenic by exam and I do not feel that any further neurological testing is needed.  Recommendations: 1) Continue to suggest/"give permisison" for improvement.    Roland Rack, MD [facility] [phone]  If 7pm- 7am, please page neurology on call as listed in [place].

## 2019-08-13 NOTE — ED Provider Notes (Signed)
[facility] EMERGENCY DEPARTMENT Provider Note   CSN: [id number] Arrival date & time: 08/13/19  2218     History   Chief Complaint No chief complaint on file.   [name] is a 43 y.o. female.    #5 caveat secondary to urgency of situation HPI  43 year old female history of anxiety, asthma, hypertension presents with last known normal at 8:43 PM.  She was in an argument with her son when she had onset of left-sided weakness.  Past Medical History:  Diagnosis Date  . 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 Anxiety   . Asthma   . Diabetes mellitus without complication ([place])    type 2  . Hypertension   . Seasonal allergies     Patient Active Problem List   Diagnosis Date Noted  . Sore throat 12/30/2016  . Hypertension 12/14/2015  . Type 2 diabetes mellitus with hyperglycemia, without long-term current use of insulin ([place]) 11/01/2015    Past Surgical History:  Procedure Laterality Date  . TUBAL LIGATION  2009     OB History   No obstetric history on file.      Home Medications    Prior to Admission medications   Medication Sig Start Date End Date Taking? Authorizing Provider  acetaminophen (TYLENOL) 325 MG tablet Take 650 mg by mouth every 6 (six) hours as needed for mild pain or headache.    [provider]  amLODipine (NORVASC) 5 MG tablet Take 1 tablet (5 mg total) by mouth daily. 03/01/19   Lajean Saver, MD  atorvastatin (LIPITOR) 20 MG tablet Take 1 tablet (20 mg total) by mouth daily. Patient not taking: Reported on 05/25/2019 05/13/18   Gildardo Pounds, NP  Blood Glucose Monitoring Suppl (TRUE METRIX METER) DEVI 1 each by Does not apply route 3 (three) times daily before meals. 12/12/17   Alfonse Spruce, FNP  chlorhexidine (PERIDEX) 0.12 % solution Use as directed 15 mLs in the mouth or throat 2 (two) times daily. Patient not taking: Reported on 10/17/2018 07/14/18   Gildardo Pounds, NP  diphenoxylate-atropine (LOMOTIL) 2.5-0.025  MG tablet Take 2 tablets by mouth 4 (four) times daily as needed for diarrhea or loose stools. Patient not taking: Reported on 10/17/2018 02/25/18   Gildardo Pounds, NP  gabapentin (NEURONTIN) 300 MG capsule Take 1 capsule (300 mg total) by mouth 3 (three) times daily. Patient not taking: Reported on 05/25/2019 05/13/18   Gildardo Pounds, NP  glipiZIDE (GLUCOTROL) 10 MG tablet Take 1 tablet (10 mg total) by mouth daily before breakfast. Patient not taking: Reported on 05/25/2019 05/13/18   Gildardo Pounds, NP  glipiZIDE (GLUCOTROL) 5 MG tablet Take 1 tablet (5 mg total) by mouth daily before breakfast. Patient not taking: Reported on 05/25/2019 03/01/19   Lajean Saver, MD  glucose blood (TRUE METRIX BLOOD GLUCOSE TEST) test strip Use as directed 3 times daily before meals 01/08/18   Gildardo Pounds, NP  ibuprofen (ADVIL,MOTRIN) 800 MG tablet Take 1 tablet (800 mg total) by mouth every 8 (eight) hours as needed. Patient not taking: Reported on 10/17/2018 05/13/18   Gildardo Pounds, NP  Insulin Glargine (LANTUS) 100 UNIT/ML Solostar Pen Inject 10 Units into the skin daily at 10 pm. Patient not taking: Reported on 05/25/2019 05/13/18 10/17/18  Gildardo Pounds, NP  Insulin Pen Needle (B-D UF III MINI PEN NEEDLES) 31G X 5 MM MISC Use as instructed 05/13/18   Gildardo Pounds, NP  lisinopril (PRINIVIL,ZESTRIL) 20 MG tablet Take 1 tablet (20 mg total) by mouth daily. 05/13/18   Gildardo Pounds, NP  metFORMIN (GLUCOPHAGE) 500 MG tablet Take 1 tablet (500 mg total) by mouth 2 (two) times daily with a meal. Patient not taking: Reported on 05/25/2019 03/01/19   Lajean Saver, MD  naproxen (NAPROSYN) 500 MG tablet Take 1 tablet (500 mg total) by mouth 2 (two) times daily. Patient not taking: Reported on 05/25/2019 10/17/18   Jacqlyn Larsen, PA-C  TRUEPLUS LANCETS 28G MISC 1 each by Does not apply route 3 (three) times daily before meals. 12/12/17   Alfonse Spruce, FNP    Family History Family History  Problem  Relation Age of Onset  . Diabetes Mother   . Hypertension Mother   . Dementia Mother   . Diabetes Father   . Hypertension Father     Social History Social History   Tobacco Use  . Smoking status: Former Smoker    Quit date: 01/25/2007    Years since quitting: 12.5  . Smokeless tobacco: Never Used  Substance Use Topics  . Alcohol use: No  . Drug use: No     Allergies   Lobster [shellfish allergy] and Reglan [metoclopramide]   Review of Systems Review of Systems  All other systems reviewed and are negative.    Physical Exam Updated Vital Signs There were no vitals taken for this visit.  Physical Exam Vitals signs and nursing note reviewed.  Constitutional:      Appearance: Normal appearance. She is normal weight.  HENT:     Head: Normocephalic.     Right Ear: External ear normal.     Left Ear: External ear normal.     Nose: Nose normal.     Mouth/Throat:     Mouth: Mucous membranes are moist.  Eyes:     Extraocular Movements: Extraocular movements intact.     Pupils: Pupils are equal, round, and reactive to light.  Neck:     Musculoskeletal: Normal range of motion and neck supple.  Cardiovascular:     Rate and Rhythm: Normal rate and regular rhythm.     Pulses: Normal pulses.  Pulmonary:     Effort: Pulmonary effort is normal.     Breath sounds: Normal breath sounds.  Abdominal:     General: Bowel sounds are normal.     Palpations: Abdomen is soft.  Musculoskeletal: Normal range of motion.  Skin:    General: Skin is warm.     Capillary Refill: Capillary refill takes less than 2 seconds.  Neurological:     Mental Status: She is alert.     Comments: Left facial droop and right facial droop alternating Diffuse extremity flaccidity- able to move with moving extremity against gravity      ED Treatments / Results  Labs (all labs ordered are listed, but only abnormal results are displayed) Labs Reviewed  CBC - Abnormal; Notable for the following  components:      Result Value   Hemoglobin 16.0 (*)    MCHC 36.7 (*)    All other components within normal limits  I-STAT CHEM 8, ED - Abnormal; Notable for the following components:   Sodium 133 (*)    Glucose, Bld 497 (*)    Calcium, Ion 1.11 (*)    TCO2 18 (*)    Hemoglobin 15.6 (*)    All other components within normal limits  CBG MONITORING, ED - Abnormal; Notable for the following components:  Glucose-Capillary 449 (*)    All other components within normal limits  DIFFERENTIAL  PROTIME-INR  APTT  COMPREHENSIVE METABOLIC PANEL  I-STAT BETA HCG BLOOD, ED ([facility], [facility], [facility] ONLY)    EKG EKG Interpretation  Date/Time:  Thursday August 13 2019 22:40:16 EDT Ventricular Rate:  91 PR Interval:    QRS Duration: 89 QT Interval:  361 QTC Calculation: 445 R Axis:   91 Text Interpretation:  Sinus rhythm Borderline right axis deviation Confirmed by Pattricia Boss [phone]) on 08/13/2019 10:56:11 PM   Radiology No results found.  Procedures Procedures (including critical care time)  Medications Ordered in ED Medications  sodium chloride flush (NS) 0.9 % injection 3 mL (has no administration in time range)     Initial Impression / Assessment and Plan / ED Course  I have reviewed the triage vital signs and the nursing notes.  Pertinent labs & imaging results that were available during my care of the patient were reviewed by me and considered in my medical decision making (see chart for details).    43 yo female presented with acute onset of weakness with exam.  Seen with neurology and not felt to represent acute neurologic event.    Final Clinical Impressions(s) / ED Diagnoses   Final diagnoses:  None    ED Discharge Orders    None       Pattricia Boss, MD 08/13/19 2354

## 2019-08-13 NOTE — ED Triage Notes (Signed)
Pt bib [facility] as a code stroke with weakness and aphasia. LKW 2140.

## 2019-08-14 ENCOUNTER — Emergency Department (HOSPITAL_COMMUNITY): Payer: Self-pay

## 2019-08-14 LAB — CBG MONITORING, ED
Glucose-Capillary: 236 mg/dL — ABNORMAL HIGH (ref 70–99)
Glucose-Capillary: 299 mg/dL — ABNORMAL HIGH (ref 70–99)

## 2019-08-14 LAB — D-DIMER, QUANTITATIVE: D-Dimer, Quant: <0.27 ug/mL-FEU (ref 0.00–0.50)

## 2019-08-14 MED ORDER — KETOROLAC TROMETHAMINE 30 MG/ML IJ SOLN
30.0000 mg | Freq: Once | INTRAMUSCULAR | Status: AC
  Administered 2019-08-14: 30 mg via INTRAVENOUS
  Filled 2019-08-14: qty 1

## 2019-08-14 MED ORDER — SODIUM CHLORIDE 0.9 % IV BOLUS
1000.0000 mL | Freq: Once | INTRAVENOUS | Status: AC
  Administered 2019-08-14: 1000 mL via INTRAVENOUS

## 2019-08-14 MED ORDER — LORAZEPAM 2 MG/ML IJ SOLN
1.0000 mg | Freq: Once | INTRAMUSCULAR | Status: AC
  Administered 2019-08-14: 01:00:00 1 mg via INTRAVENOUS
  Filled 2019-08-14: qty 1

## 2019-08-14 MED ORDER — GLIPIZIDE 5 MG PO TABS: 5.0000 mg | ORAL_TABLET | Freq: Every day | ORAL | 0 refills | Status: DC

## 2019-08-14 MED ORDER — INSULIN GLARGINE 100 UNIT/ML SOLOSTAR PEN: 10.0000 [IU] | PEN_INJECTOR | Freq: Every day | SUBCUTANEOUS | 0 refills | Status: DC

## 2019-08-14 NOTE — ED Notes (Signed)
Family at bedside. 

## 2019-08-14 NOTE — ED Notes (Signed)
CBG Results of 236 reported to [place], Therapist, sports.

## 2019-08-14 NOTE — ED Provider Notes (Signed)
Care assumed from Dr. Jeanell Sparrow.  Patient with history of anxiety, asthma and hypertension and diabetes presenting with difficulty speaking and sudden onset left-sided weakness.  She was seen as a code stroke by neurology which was canceled by Dr. Leonel Ramsay.  CT head is negative.  Dr. Leonel Ramsay felt that her presentation was more psychogenic in etiology.  She is hyperglycemic with normal anion gap.  She does not take any medications for her blood pressure or blood sugar. She is very tearful and anxious.  Blood sugar is improving with IV hydration.  On attempted ambulation she complains of severe left hip pain which she says is been ongoing for several weeks to months but is worse tonight. Denies any falls or trauma. Complains of burning in her feet.  Intact DP and PT pulses bilaterally.  She has a poor effort on neurological exam but is able to move her legs and stand and walk.  Intact patellar reflexes bilaterally.  Blood sugar is improved at 236.  Patient extremely anxious, tachycardic and tachypneic.  No evidence of DKA.  X-rays will be obtained of her left hip given her report of pain.  X-rays are negative..  On recheck, patient gives a very poor effort on neuro exam but is able to move all extremities.  No facial droop.  Finger-to-nose intact but very slow.  Discussed with Dr. Leonel Ramsay of neurology who agrees that patient does not need any further neurological work-up or imaging.  He feels her presentation is psychogenic. Patient is not suicidal or homicidal.  She admits to being off of her diabetes medications for several months.  Patient is able to ambulate.  We will restart her diabetes medications. Needs to reestablish care with PCP.  Her heart rate and respiratory rate have normalized.  D-dimer is negative.  Return precautions discussed.   Ezequiel Essex, MD 08/14/19 [phone]

## 2019-08-14 NOTE — Discharge Instructions (Signed)
Your testing is negative for stroke.  Follow-up with your doctor as [facility].  Restart your diabetes medications including her glipizide and insulin.  Follow your blood sugars and check on a regular basis.  Return to the ED if you develop new or worsening symptoms.

## 2019-12-07 ENCOUNTER — Emergency Department (HOSPITAL_COMMUNITY)
Admission: EM | Admit: 2019-12-07 | Discharge: 2019-12-07 | Disposition: A | Discharge status: Home / Self Care | Payer: Self-pay | Attending: Emergency Medicine | Admitting: Emergency Medicine

## 2019-12-07 ENCOUNTER — Encounter (HOSPITAL_COMMUNITY): Payer: Self-pay

## 2019-12-07 ENCOUNTER — Other Ambulatory Visit: Payer: Self-pay

## 2019-12-07 DIAGNOSIS — Z5321 Procedure and treatment not carried out due to patient leaving prior to being seen by health care provider: Secondary | ICD-10-CM | POA: Insufficient documentation

## 2019-12-07 DIAGNOSIS — K0889 Other specified disorders of teeth and supporting structures: Secondary | ICD-10-CM | POA: Insufficient documentation

## 2019-12-07 NOTE — ED Triage Notes (Signed)
Patient c/o right upper dental pain 3 days ago. Today, the patient woke and had facial swelling. Patient denies any difficulty swallowing and no issues with breathing.

## 2019-12-07 NOTE — ED Notes (Signed)
Pt not seen in lobby when called for vitals.

## 2020-02-03 ENCOUNTER — Ambulatory Visit: Payer: Self-pay | Attending: Nurse Practitioner | Admitting: Nurse Practitioner

## 2020-02-03 ENCOUNTER — Other Ambulatory Visit: Payer: Self-pay | Admitting: Nurse Practitioner

## 2020-02-03 ENCOUNTER — Other Ambulatory Visit: Payer: Self-pay

## 2020-02-03 ENCOUNTER — Encounter: Payer: Self-pay | Admitting: Nurse Practitioner

## 2020-02-03 DIAGNOSIS — G8929 Other chronic pain: Secondary | ICD-10-CM

## 2020-02-03 DIAGNOSIS — E1142 Type 2 diabetes mellitus with diabetic polyneuropathy: Secondary | ICD-10-CM

## 2020-02-03 DIAGNOSIS — M5442 Lumbago with sciatica, left side: Secondary | ICD-10-CM

## 2020-02-03 DIAGNOSIS — I10 Essential (primary) hypertension: Secondary | ICD-10-CM

## 2020-02-03 DIAGNOSIS — E782 Mixed hyperlipidemia: Secondary | ICD-10-CM

## 2020-02-03 DIAGNOSIS — E1165 Type 2 diabetes mellitus with hyperglycemia: Secondary | ICD-10-CM

## 2020-02-03 DIAGNOSIS — Z794 Long term (current) use of insulin: Secondary | ICD-10-CM

## 2020-02-03 DIAGNOSIS — M5441 Lumbago with sciatica, right side: Secondary | ICD-10-CM

## 2020-02-03 DIAGNOSIS — Z130 Encounter for screening for diseases of the blood and blood-forming organs and certain disorders involving the immune mechanism: Secondary | ICD-10-CM

## 2020-02-03 MED ORDER — LISINOPRIL 20 MG PO TABS: 20.0000 mg | ORAL_TABLET | Freq: Every day | ORAL | 1 refills | Status: DC

## 2020-02-03 MED ORDER — BD PEN NEEDLE MINI U/F 31G X 5 MM MISC: 1 refills | Status: DC

## 2020-02-03 MED ORDER — TRUE METRIX BLOOD GLUCOSE TEST VI STRP: ORAL_STRIP | 12 refills | Status: DC

## 2020-02-03 MED ORDER — VICTOZA 18 MG/3ML ~~LOC~~ SOPN: PEN_INJECTOR | SUBCUTANEOUS | 6 refills | Status: DC

## 2020-02-03 MED ORDER — GABAPENTIN 300 MG PO CAPS: 300.0000 mg | ORAL_CAPSULE | Freq: Three times a day (TID) | ORAL | 3 refills | Status: DC

## 2020-02-03 MED ORDER — ATORVASTATIN CALCIUM 20 MG PO TABS: 20.0000 mg | ORAL_TABLET | Freq: Every day | ORAL | 3 refills | Status: DC

## 2020-02-03 MED ORDER — INSULIN GLARGINE 100 UNIT/ML SOLOSTAR PEN: 20.0000 [IU] | PEN_INJECTOR | Freq: Every day | SUBCUTANEOUS | 3 refills | Status: DC

## 2020-02-03 MED ORDER — IBUPROFEN 800 MG PO TABS: 800.0000 mg | ORAL_TABLET | Freq: Three times a day (TID) | ORAL | 1 refills | Status: DC | PRN

## 2020-02-03 MED ORDER — TRUEPLUS LANCETS 28G MISC: 1.0000 | Freq: Three times a day (TID) | 12 refills | Status: DC

## 2020-02-03 MED ORDER — GLIPIZIDE 5 MG PO TABS: 5.0000 mg | ORAL_TABLET | Freq: Every day | ORAL | 0 refills | Status: DC

## 2020-02-03 MED ORDER — AMLODIPINE BESYLATE 5 MG PO TABS: 5.0000 mg | ORAL_TABLET | Freq: Every day | ORAL | 0 refills | Status: DC

## 2020-02-03 MED FILL — IBUPROFEN 800 MG TABLET: 800 | 20 days supply | Qty: 60 | Fill #0

## 2020-02-03 MED FILL — VICTOZA 18 MG/3 ML INJECT P: 18 | 17 days supply | Qty: 3 | Fill #0

## 2020-02-03 MED FILL — LISINOPRIL 20 MG TABLET: 20 | 30 days supply | Qty: 30 | Fill #0

## 2020-02-03 MED FILL — TRUE METRIX TEST STRIP: 33 days supply | Qty: 100 | Fill #0

## 2020-02-03 MED FILL — TRUEplus LANCETS 28G MISC: 33 days supply | Qty: 100 | Fill #0

## 2020-02-03 MED FILL — glipiZIDE 5 MG TABS: 5 | 30 days supply | Qty: 30 | Fill #0

## 2020-02-03 MED FILL — TRUEPLUS 5-BEVEL PEN NEEDLE: 31G X 5 MM | 33 days supply | Qty: 100 | Fill #0

## 2020-02-03 MED FILL — GABAPENTIN 300 MG CAPSULE: 300 | 30 days supply | Qty: 90 | Fill #0

## 2020-02-03 MED FILL — !LANTUS SOLOSTAR 100UNITS/M: 100 | 30 days supply | Qty: 6 | Fill #0

## 2020-02-03 MED FILL — ATORVASTATIN CALCIUM 20 MG: 20 | 30 days supply | Qty: 30 | Fill #0

## 2020-02-03 NOTE — Progress Notes (Signed)
Virtual Visit via Telephone Note Due to national recommendations of social distancing due to [place] 19, telehealth visit is felt to be most appropriate for this patient at this time.  I discussed the limitations, risks, security and privacy concerns of performing an evaluation and management service by telephone and the availability of in person appointments. I also discussed with the patient that there may be a patient responsible charge related to this service. The patient expressed understanding and agreed to proceed.    I connected with Wendy Olson on 02/03/20  at  10:30 AM EST  EDT by telephone and verified that I am speaking with the correct person using two identifiers.   Consent I discussed the limitations, risks, security and privacy concerns of performing an evaluation and management service by telephone and the availability of in person appointments. I also discussed with the patient that there may be a patient responsible charge related to this service. The patient expressed understanding and agreed to proceed.   Location of Patient: Private Residence    Location of Provider: [place] participating in Telemedicine visit: Geryl Rankins FNP-BC [place]    History of Present Illness: Telemedicine visit for: Establish Care  I have not seen her in this office since 04-2018. Unfortunately she has not been taking any of her medications including her statin, antihypertensives, and antidiabetic medications.  She does not monitor her blood pressure at home.  She does not currently endorse any chest pain, shortness of breath,  worsening headache or any worsening bilateral lower extremity edema.  She has been monitoring her blood glucose levels 1-2 times per day.  Reports readings as "high".  Recent postprandial eating in the 400s.  She has diabetic complications including polyneuropathy for which she was previously  prescribed gabapentin.  She is overdue for eye exam.  Referral has been placed today and she has been advised to apply for the financial assistance program. Lab Results  Component Value Date   HGBA1C 8.5 (A) 05/13/2018       Past Medical History:  Diagnosis Date  . 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 Anxiety   . Asthma   . Diabetes mellitus without complication ([place])    type 2  . Hypertension   . Seasonal allergies     Past Surgical History:  Procedure Laterality Date  . TUBAL LIGATION  2009    Family History  Problem Relation Age of Onset  . Diabetes Mother   . Hypertension Mother   . Dementia Mother   . Diabetes Father   . Hypertension Father     Social History   Socioeconomic History  . Marital status: Single    Spouse name: Not on file  . Number of children: Not on file  . Years of education: Not on file  . Highest education level: Not on file  Occupational History  . Not on file  Tobacco Use  . Smoking status: Former Smoker    Quit date: 01/25/2007    Years since quitting: 13.0  . Smokeless tobacco: Never Used  Substance and Sexual Activity  . Alcohol use: No  . Drug use: No  . Sexual activity: Yes    Birth control/protection: Surgical  Other Topics Concern  . Not on file  Social History Narrative  . Not on file   Social Determinants of Health   Financial Resource Strain:   . Difficulty of Paying Living Expenses: Not on  file  Food Insecurity:   . Worried About Charity fundraiser in the Last Year: Not on file  . Ran Out of Food in the Last Year: Not on file  Transportation Needs:   . Lack of Transportation (Medical): Not on file  . Lack of Transportation (Non-Medical): Not on file  Physical Activity:   . Days of Exercise per Week: Not on file  . Minutes of Exercise per Session: Not on file  Stress:   . Feeling of Stress : Not on file  Social Connections:   . Frequency of Communication with Friends and Family: Not on file  . Frequency of Social Gatherings  with Friends and Family: Not on file  . Attends Religious Services: Not on file  . Active Member of Clubs or Organizations: Not on file  . Attends Archivist Meetings: Not on file  . Marital Status: Not on file     Observations/Objective: Awake, alert and oriented x 3   Review of Systems  Constitutional: Negative for fever, malaise/fatigue and weight loss.  HENT: Negative.  Negative for nosebleeds.   Eyes: Negative.  Negative for blurred vision, double vision and photophobia.  Respiratory: Negative.  Negative for cough and shortness of breath.   Cardiovascular: Negative.  Negative for chest pain, palpitations and leg swelling.  Gastrointestinal: Negative.  Negative for heartburn, nausea and vomiting.  Musculoskeletal: Positive for back pain. Negative for myalgias.  Neurological: Negative.  Negative for dizziness, focal weakness, seizures and headaches.  Psychiatric/Behavioral: Negative.  Negative for suicidal ideas.    Assessment and Plan: Wendy Olson was seen today for establish care.  Diagnoses and all orders for this visit:  Type 2 diabetes mellitus with diabetic polyneuropathy, with long-term current use of insulin (HCC) -     liraglutide (VICTOZA) 18 MG/3ML SOPN; SubQ:  Inject 0.6 mg once daily into the skin. Week 2: increase to 1.2 mg once daily; week 3: increase to 1.8 mg once daily -     TRUEplus Lancets 28G MISC; 1 each by Does not apply route 3 (three) times daily before meals. -     Insulin Pen Needle (B-D UF III MINI PEN NEEDLES) 31G X 5 MM MISC; Use as instructed -     insulin glargine (LANTUS) 100 UNIT/ML Solostar Pen; Inject 20 Units into the skin daily at 10 pm. -     gabapentin (NEURONTIN) 300 MG capsule; Take 1 capsule (300 mg total) by mouth 3 (three) times daily. -     glucose blood (TRUE METRIX BLOOD GLUCOSE TEST) test strip; Use as directed 3 times daily before meals -     glipiZIDE (GLUCOTROL) 5 MG tablet; Take 1 tablet (5 mg total) by mouth daily before  breakfast. -     Ambulatory referral to Ophthalmology -     Hemoglobin A1c; Future -     CMP14+EGFR; Future -     Microalbumin / creatinine urine ratio; Future -     TSH; Future  Mixed hyperlipidemia -     atorvastatin (LIPITOR) 20 MG tablet; Take 1 tablet (20 mg total) by mouth daily. -     Lipid panel; Future INSTRUCTIONS: Work on a low fat, heart healthy diet and participate in regular aerobic exercise program by working out at least 150 minutes per week; 5 days a week-30 minutes per day. Avoid red meat/beef/steak,  fried foods. junk foods, sodas, sugary drinks, unhealthy snacking, alcohol and smoking.  Drink at least 80 oz of water per day and  monitor your carbohydrate intake daily.   Essential hypertension -     amLODipine (NORVASC) 5 MG tablet; Take 1 tablet (5 mg total) by mouth daily. -     lisinopril (ZESTRIL) 20 MG tablet; Take 1 tablet (20 mg total) by mouth daily. Continue all antihypertensives as prescribed.  Remember to bring in your blood pressure log with you for your follow up appointment.  DASH/Mediterranean Diets are healthier choices for HTN.    Chronic bilateral low back pain with bilateral sciatica -     ibuprofen (ADVIL) 800 MG tablet; Take 1 tablet (800 mg total) by mouth every 8 (eight) hours as needed. Work on losing weight to help reduce back pain. May alternate with heat and ice application for pain relief. May also alternate with acetaminophen and Ibuprofen as prescribed for back pain. Other alternatives include massage, acupuncture and water aerobics.  You must stay active and avoid a sedentary lifestyle.   Screening for deficiency anemia -     CBC; Future     Follow Up Instructions Return in about 6 weeks (around 03/16/2020) for BP recheck, meter check.     I discussed the assessment and treatment plan with the patient. The patient was provided an opportunity to ask questions and all were answered. The patient agreed with the plan and demonstrated an  understanding of the instructions.   The patient was advised to call back or seek an in-person evaluation if the symptoms worsen or if the condition fails to improve as anticipated.  I provided 17 minutes of non-face-to-face time during this encounter including median intraservice time, reviewing previous notes, labs, imaging, medications and explaining diagnosis and management.  Gildardo Pounds, FNP-BC

## 2020-02-05 ENCOUNTER — Other Ambulatory Visit: Payer: Self-pay

## 2020-02-05 ENCOUNTER — Ambulatory Visit: Payer: Self-pay | Attending: Nurse Practitioner

## 2020-02-05 DIAGNOSIS — E1142 Type 2 diabetes mellitus with diabetic polyneuropathy: Secondary | ICD-10-CM

## 2020-02-05 DIAGNOSIS — E782 Mixed hyperlipidemia: Secondary | ICD-10-CM

## 2020-02-05 DIAGNOSIS — Z794 Long term (current) use of insulin: Secondary | ICD-10-CM

## 2020-02-05 DIAGNOSIS — Z130 Encounter for screening for diseases of the blood and blood-forming organs and certain disorders involving the immune mechanism: Secondary | ICD-10-CM

## 2020-02-06 LAB — CMP14+EGFR
ALT: 46 IU/L — ABNORMAL HIGH (ref 0–32)
AST: 24 IU/L (ref 0–40)
Albumin/Globulin Ratio: 1.6 (ref 1.2–2.2)
Albumin: 4.3 g/dL (ref 3.8–4.8)
Alkaline Phosphatase: 215 IU/L — ABNORMAL HIGH (ref 39–117)
BUN/Creatinine Ratio: 23 (ref 9–23)
BUN: 14 mg/dL (ref 6–24)
Bilirubin Total: 0.4 mg/dL (ref 0.0–1.2)
CO2: 18 mmol/L — ABNORMAL LOW (ref 20–29)
Calcium: 9.6 mg/dL (ref 8.7–10.2)
Chloride: 102 mmol/L (ref 96–106)
Creatinine, Ser: 0.62 mg/dL (ref 0.57–1.00)
GFR calc Af Amer: 128 mL/min/{1.73_m2} (ref >59)
GFR calc non Af Amer: 111 mL/min/{1.73_m2} (ref >59)
Globulin, Total: 2.7 g/dL (ref 1.5–4.5)
Glucose: 245 mg/dL — ABNORMAL HIGH (ref 65–99)
Potassium: 4 mmol/L (ref 3.5–5.2)
Sodium: 138 mmol/L (ref 134–144)
Total Protein: 7 g/dL (ref 6.0–8.5)

## 2020-02-06 LAB — HEMOGLOBIN A1C
Est. average glucose Bld gHb Est-mCnc: 266 mg/dL
Hgb A1c MFr Bld: 10.9 % — ABNORMAL HIGH (ref 4.8–5.6)

## 2020-02-06 LAB — CBC
Hematocrit: 46.4 % (ref 34.0–46.6)
Hemoglobin: 15.8 g/dL (ref 11.1–15.9)
MCH: 30.4 pg (ref 26.6–33.0)
MCHC: 34.1 g/dL (ref 31.5–35.7)
MCV: 89 fL (ref 79–97)
Platelets: 329 10*3/uL (ref 150–450)
RBC: 5.19 x10E6/uL (ref 3.77–5.28)
RDW: 13 % (ref 11.7–15.4)
WBC: 7.4 10*3/uL (ref 3.4–10.8)

## 2020-02-06 LAB — MICROALBUMIN / CREATININE URINE RATIO
Creatinine, Urine: 139.5 mg/dL
Microalb/Creat Ratio: 8 mg/g creat (ref 0–29)
Microalbumin, Urine: 11.4 ug/mL

## 2020-02-06 LAB — TSH: TSH: 2.41 u[IU]/mL (ref 0.450–4.500)

## 2020-02-06 LAB — LIPID PANEL
Chol/HDL Ratio: 7.2 ratio — ABNORMAL HIGH (ref 0.0–4.4)
Cholesterol, Total: 288 mg/dL — ABNORMAL HIGH (ref 100–199)
HDL: 40 mg/dL (ref >39)
LDL Chol Calc (NIH): 191 mg/dL — ABNORMAL HIGH (ref 0–99)
Triglycerides: 291 mg/dL — ABNORMAL HIGH (ref 0–149)
VLDL Cholesterol Cal: 57 mg/dL — ABNORMAL HIGH (ref 5–40)

## 2020-02-15 ENCOUNTER — Other Ambulatory Visit: Payer: Self-pay | Admitting: Nurse Practitioner

## 2020-02-15 ENCOUNTER — Encounter: Payer: Self-pay | Admitting: Nurse Practitioner

## 2020-02-16 MED FILL — VICTOZA 18 MG/3 ML INJECT P: 18 | 30 days supply | Qty: 9 | Fill #1

## 2020-02-19 ENCOUNTER — Other Ambulatory Visit: Payer: Self-pay | Admitting: Nurse Practitioner

## 2020-02-19 ENCOUNTER — Encounter: Payer: Self-pay | Admitting: Nurse Practitioner

## 2020-02-19 DIAGNOSIS — E1142 Type 2 diabetes mellitus with diabetic polyneuropathy: Secondary | ICD-10-CM

## 2020-02-19 NOTE — Telephone Encounter (Signed)
See information

## 2020-02-21 ENCOUNTER — Other Ambulatory Visit: Payer: Self-pay | Admitting: Nurse Practitioner

## 2020-02-21 ENCOUNTER — Encounter: Payer: Self-pay | Admitting: Nurse Practitioner

## 2020-02-21 DIAGNOSIS — E1142 Type 2 diabetes mellitus with diabetic polyneuropathy: Secondary | ICD-10-CM

## 2020-02-21 MED ORDER — INSULIN GLARGINE 100 UNIT/ML SOLOSTAR PEN: 20.0000 [IU] | PEN_INJECTOR | Freq: Every day | SUBCUTANEOUS | 3 refills | Status: DC

## 2020-02-21 MED ORDER — GLIPIZIDE 5 MG PO TABS: 5.0000 mg | ORAL_TABLET | Freq: Every day | ORAL | 0 refills | Status: DC

## 2020-02-21 NOTE — Telephone Encounter (Signed)
Appears that RX was filled 2 weeks ago by patient's PCP, Z. Raul Del, NP but if not, 30 day supply sent in as patient with an appointment with her PCP on 03/15/20.

## 2020-02-22 ENCOUNTER — Encounter: Payer: Self-pay | Admitting: Nurse Practitioner

## 2020-02-23 ENCOUNTER — Encounter: Payer: Self-pay | Admitting: Nurse Practitioner

## 2020-02-24 ENCOUNTER — Other Ambulatory Visit: Payer: Self-pay | Admitting: Family Medicine

## 2020-02-24 DIAGNOSIS — E1142 Type 2 diabetes mellitus with diabetic polyneuropathy: Secondary | ICD-10-CM

## 2020-02-28 ENCOUNTER — Other Ambulatory Visit: Payer: Self-pay | Admitting: Family Medicine

## 2020-02-28 ENCOUNTER — Encounter: Payer: Self-pay | Admitting: Nurse Practitioner

## 2020-02-28 DIAGNOSIS — E1142 Type 2 diabetes mellitus with diabetic polyneuropathy: Secondary | ICD-10-CM

## 2020-02-28 MED ORDER — GLIPIZIDE 5 MG PO TABS: 5.0000 mg | ORAL_TABLET | Freq: Every day | ORAL | 0 refills | Status: DC

## 2020-02-29 ENCOUNTER — Encounter: Payer: Self-pay | Admitting: Nurse Practitioner

## 2020-02-29 MED FILL — glipiZIDE 5 MG TABS: 5 | 30 days supply | Qty: 30 | Fill #0

## 2020-03-01 MED FILL — LISINOPRIL 20 MG TABLET: 20 | 30 days supply | Qty: 30 | Fill #1

## 2020-03-01 MED FILL — ATORVASTATIN CALCIUM 20 MG: 20 | 30 days supply | Qty: 30 | Fill #1

## 2020-03-06 ENCOUNTER — Encounter: Payer: Self-pay | Admitting: Nurse Practitioner

## 2020-03-07 MED FILL — !LANTUS SOLOSTAR 100UNITS/M: 100 | 30 days supply | Qty: 6 | Fill #0

## 2020-03-15 ENCOUNTER — Ambulatory Visit: Payer: Self-pay | Attending: Nurse Practitioner | Admitting: Nurse Practitioner

## 2020-03-15 ENCOUNTER — Encounter: Payer: Self-pay | Admitting: Nurse Practitioner

## 2020-03-15 ENCOUNTER — Other Ambulatory Visit: Payer: Self-pay

## 2020-03-15 VITALS — BP 121/77 | HR 80 | Temp 97.7°F | Ht 67.0 in | Wt 219.6 lb

## 2020-03-15 DIAGNOSIS — Z794 Long term (current) use of insulin: Secondary | ICD-10-CM

## 2020-03-15 DIAGNOSIS — Z114 Encounter for screening for human immunodeficiency virus [HIV]: Secondary | ICD-10-CM

## 2020-03-15 DIAGNOSIS — E1142 Type 2 diabetes mellitus with diabetic polyneuropathy: Secondary | ICD-10-CM

## 2020-03-15 LAB — GLUCOSE, POCT (MANUAL RESULT ENTRY): POC Glucose: 83 mg/dl (ref 70–99)

## 2020-03-15 MED ORDER — BD PEN NEEDLE MINI U/F 31G X 5 MM MISC: 6 refills | Status: DC

## 2020-03-15 MED ORDER — GLIPIZIDE 5 MG PO TABS: 5.0000 mg | ORAL_TABLET | Freq: Every day | ORAL | 1 refills | Status: DC

## 2020-03-15 MED ORDER — VICTOZA 18 MG/3ML ~~LOC~~ SOPN: 1.8000 mg | PEN_INJECTOR | Freq: Every day | SUBCUTANEOUS | 6 refills | Status: DC

## 2020-03-15 NOTE — Progress Notes (Signed)
Assessment & Plan:  [name] was seen today for follow-up.  Diagnoses and all orders for this visit:  Type 2 diabetes mellitus with diabetic polyneuropathy, with long-term current use of insulin (HCC) -     Glucose (CBG) -     glipiZIDE (GLUCOTROL) 5 MG tablet; Take 1 tablet (5 mg total) by mouth daily before breakfast. -     liraglutide (VICTOZA) 18 MG/3ML SOPN; Inject 0.3 mLs (1.8 mg total) into the skin daily. -     Insulin Pen Needle (B-D UF III MINI PEN NEEDLES) 31G X 5 MM MISC; Use as instructed Continue blood sugar control as discussed in office today, low carbohydrate diet, and regular physical exercise as tolerated, 150 minutes per week (30 min each day, 5 days per week, or 50 min 3 days per week). Keep blood sugar logs with fasting goal of 90-130 mg/dl, post prandial (after you eat) less than 180.  For Hypoglycemia: BS <60 and Hyperglycemia BS >400; contact the clinic ASAP. Annual eye exams and foot exams are recommended.   Encounter for screening for HIV -     HIV antibody (with reflex)    Patient has been counseled on age-appropriate routine health concerns for screening and prevention. These are reviewed and up-to-date. Referrals have been placed accordingly. Immunizations are up-to-date or declined.    Subjective:   Chief Complaint  Patient presents with  . Follow-up    Pt. is here for blood pressure check and meter check.    [name] 44 y.o. female presents to office today for meter check. Somber today. Father recently diagnosed with terminal pancreatic cancer. Mother died a few years ago.   DM TYPE 2 She has her meter for review today.  7 day average 145 14 day average 151 30 day average 153 90 day average 172 Monitoring blood glucose levels twice per day. Weight is down 8lbs since last visit. She is currently taking glipizide 5 mg daily, lantus 20 units nightly and victoza 1.8 mg daily. Hyperglycemic symptoms include peripheral neuropathy for which  she takes gabapentin 300 mg TID. Using the Relion meter. States she could not afford the True metrix meter that was offered here. Blood pressure is well controlled. Taking amlodipine 5 mg daily. She states she has not been taking lisinopril 20 mg and was not aware it had been prescribed. Denies chest pain, shortness of breath, palpitations, lightheadedness, dizziness, headaches or BLE edema. LDL not at goal of <70. Taking atorvastatin 20 mg daily. Denies statin intolerance.  Lab Results  Component Value Date   HGBA1C 10.9 (H) 02/05/2020   BP Readings from Last 3 Encounters:  03/15/20 121/77  12/07/19 (!) 156/102  08/14/19 120/84   Lab Results  Component Value Date   LDLCALC 191 (H) 02/05/2020    Review of Systems  Constitutional: Negative for fever, malaise/fatigue and weight loss.  HENT: Negative.  Negative for nosebleeds.   Eyes: Negative.  Negative for blurred vision, double vision and photophobia.  Respiratory: Negative.  Negative for cough and shortness of breath.   Cardiovascular: Negative.  Negative for chest pain, palpitations and leg swelling.  Gastrointestinal: Negative.  Negative for heartburn, nausea and vomiting.  Musculoskeletal: Negative.  Negative for myalgias.  Neurological: Negative.  Negative for dizziness, focal weakness, seizures and headaches.  Psychiatric/Behavioral: Negative.  Negative for suicidal ideas.    Past Medical History:  Diagnosis Date  . 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 Anxiety   . Asthma   . Diabetes  mellitus without complication ([place])    type 2  . Hypertension   . Seasonal allergies     Past Surgical History:  Procedure Laterality Date  . TUBAL LIGATION  2009    Family History  Problem Relation Age of Onset  . Diabetes Mother   . Hypertension Mother   . Dementia Mother   . Diabetes Father   . Hypertension Father     Social History Reviewed with no changes to be made today.   Outpatient Medications Prior to Visit  Medication Sig  Dispense Refill  . amLODipine (NORVASC) 5 MG tablet Take 1 tablet (5 mg total) by mouth daily. 30 tablet 0  . atorvastatin (LIPITOR) 20 MG tablet Take 1 tablet (20 mg total) by mouth daily. 90 tablet 3  . gabapentin (NEURONTIN) 300 MG capsule Take 1 capsule (300 mg total) by mouth 3 (three) times daily. 90 capsule 3  . ibuprofen (ADVIL) 800 MG tablet Take 1 tablet (800 mg total) by mouth every 8 (eight) hours as needed. 60 tablet 1  . insulin glargine (LANTUS) 100 UNIT/ML Solostar Pen Inject 20 Units into the skin daily at 10 pm. 5 pen 3  . glipiZIDE (GLUCOTROL) 5 MG tablet Take 1 tablet (5 mg total) by mouth daily before breakfast. 30 tablet 0  . Insulin Pen Needle (B-D UF III MINI PEN NEEDLES) 31G X 5 MM MISC Use as instructed 100 each 1  . liraglutide (VICTOZA) 18 MG/3ML SOPN SubQ:  Inject 0.6 mg once daily into the skin. Week 2: increase to 1.2 mg once daily; week 3: increase to 1.8 mg once daily 3 pen 6  . Blood Glucose Monitoring Suppl (TRUE METRIX METER) DEVI 1 each by Does not apply route 3 (three) times daily before meals. (Patient not taking: Reported on 02/03/2020) 1 Device 0  . glucose blood (TRUE METRIX BLOOD GLUCOSE TEST) test strip Use as directed 3 times daily before meals 100 each 12  . lisinopril (ZESTRIL) 20 MG tablet Take 1 tablet (20 mg total) by mouth daily. (Patient not taking: Reported on 03/15/2020) 90 tablet 1  . TRUEplus Lancets 28G MISC 1 each by Does not apply route 3 (three) times daily before meals. 100 each 12   No facility-administered medications prior to visit.    Allergies  Allergen Reactions  . Lobster [Shellfish Allergy] Anaphylaxis    Just lobster   . Reglan [Metoclopramide]     akasthesia       Objective:    BP 121/77 (BP Location: Left Arm, Patient Position: Sitting, Cuff Size: Normal)   Pulse 80   Temp 97.7 F (36.5 C) (Temporal)   Ht 5\' 7"  (1.702 m)   Wt 219 lb 9.6 oz (99.6 kg)   LMP 01/25/2020   SpO2 98%   BMI 34.39 kg/m  Wt Readings  from Last 3 Encounters:  03/15/20 219 lb 9.6 oz (99.6 kg)  12/07/19 227 lb 3.2 oz (103.1 kg)  08/13/19 221 lb 12.5 oz (100.6 kg)    Physical Exam Vitals and nursing note reviewed.  Constitutional:      Appearance: She is well-developed.  HENT:     Head: Normocephalic and atraumatic.  Cardiovascular:     Rate and Rhythm: Normal rate and regular rhythm.     Heart sounds: Normal heart sounds. No murmur. No friction rub. No gallop.   Pulmonary:     Effort: Pulmonary effort is normal. No tachypnea or respiratory distress.     Breath sounds: Normal breath sounds.  No decreased breath sounds, wheezing, rhonchi or rales.  Chest:     Chest wall: No tenderness.  Abdominal:     General: Bowel sounds are normal.     Palpations: Abdomen is soft.  Musculoskeletal:        General: Normal range of motion.     Cervical back: Normal range of motion.  Skin:    General: Skin is warm and dry.  Neurological:     Mental Status: She is alert and oriented to person, place, and time.     Coordination: Coordination normal.  Psychiatric:        Behavior: Behavior normal. Behavior is cooperative.        Thought Content: Thought content normal.        Judgment: Judgment normal.          Patient has been counseled extensively about nutrition and exercise as well as the importance of adherence with medications and regular follow-up. The patient was given clear instructions to go to ER or return to medical center if symptoms don't improve, worsen or new problems develop. The patient verbalized understanding.   Follow-up: Return for PAP SMEAR.   Gildardo Pounds, FNP-BC [facility] and [facility] [place], [place]   03/15/2020, 10:10 AM

## 2020-03-16 ENCOUNTER — Encounter: Payer: Self-pay | Admitting: Nurse Practitioner

## 2020-03-17 ENCOUNTER — Other Ambulatory Visit: Payer: Self-pay | Admitting: Nurse Practitioner

## 2020-03-17 ENCOUNTER — Telehealth: Payer: Self-pay

## 2020-03-17 ENCOUNTER — Encounter: Payer: Self-pay | Admitting: Nurse Practitioner

## 2020-03-17 MED ORDER — OZEMPIC (0.25 OR 0.5 MG/DOSE) 2 MG/1.5ML ~~LOC~~ SOPN: PEN_INJECTOR | SUBCUTANEOUS | 2 refills | Status: DC

## 2020-03-17 MED FILL — OZEMPIC 0.25 OR 0.5 MG/DOSE: 2 | 28 days supply | Qty: 2 | Fill #0

## 2020-03-17 NOTE — Telephone Encounter (Signed)
Pt now has ins and her Victoza is $200 today.  I was checking cost and Ozempic has a coupon.  Her copay for Ozempic is also $200 but there is a coupon that will bring it down to $24.99.  Victoza does not have a coupon that I am aware of.  Would a change of therapy be appropriate?

## 2020-03-17 NOTE — Telephone Encounter (Signed)
Changed to ozempic. Victoza dc'd. Thank you

## 2020-03-22 ENCOUNTER — Encounter: Payer: Self-pay | Admitting: Nurse Practitioner

## 2020-03-24 MED FILL — glipiZIDE 5 MG TABS: 5 | 90 days supply | Qty: 90 | Fill #0

## 2020-03-27 ENCOUNTER — Encounter: Payer: Self-pay | Admitting: Nurse Practitioner

## 2020-03-29 ENCOUNTER — Encounter: Payer: Self-pay | Admitting: Nurse Practitioner

## 2020-04-11 ENCOUNTER — Encounter: Payer: Self-pay | Admitting: Nurse Practitioner

## 2020-04-12 ENCOUNTER — Encounter: Payer: Self-pay | Admitting: Nurse Practitioner

## 2020-04-12 ENCOUNTER — Ambulatory Visit: Payer: 59 | Attending: Nurse Practitioner | Admitting: Nurse Practitioner

## 2020-04-12 ENCOUNTER — Other Ambulatory Visit: Payer: Self-pay

## 2020-04-12 ENCOUNTER — Other Ambulatory Visit: Payer: Self-pay | Admitting: Nurse Practitioner

## 2020-04-12 VITALS — BP 107/69 | HR 77 | Temp 97.7°F | Ht 67.0 in | Wt 221.0 lb

## 2020-04-12 DIAGNOSIS — E1142 Type 2 diabetes mellitus with diabetic polyneuropathy: Secondary | ICD-10-CM

## 2020-04-12 DIAGNOSIS — Z794 Long term (current) use of insulin: Secondary | ICD-10-CM

## 2020-04-12 DIAGNOSIS — I10 Essential (primary) hypertension: Secondary | ICD-10-CM

## 2020-04-12 DIAGNOSIS — M25551 Pain in right hip: Secondary | ICD-10-CM

## 2020-04-12 DIAGNOSIS — E782 Mixed hyperlipidemia: Secondary | ICD-10-CM

## 2020-04-12 DIAGNOSIS — M25552 Pain in left hip: Secondary | ICD-10-CM

## 2020-04-12 DIAGNOSIS — Z124 Encounter for screening for malignant neoplasm of cervix: Secondary | ICD-10-CM

## 2020-04-12 LAB — GLUCOSE, POCT (MANUAL RESULT ENTRY): POC Glucose: 93 mg/dl (ref 70–99)

## 2020-04-12 MED ORDER — BD PEN NEEDLE MINI U/F 31G X 5 MM MISC: 6 refills | Status: DC

## 2020-04-12 MED ORDER — GABAPENTIN 300 MG PO CAPS: 300.0000 mg | ORAL_CAPSULE | Freq: Three times a day (TID) | ORAL | 3 refills | Status: DC

## 2020-04-12 MED ORDER — AMLODIPINE BESYLATE 5 MG PO TABS: 5.0000 mg | ORAL_TABLET | Freq: Every day | ORAL | 0 refills | Status: DC

## 2020-04-12 MED ORDER — INSULIN GLARGINE 100 UNIT/ML SOLOSTAR PEN: 20.0000 [IU] | PEN_INJECTOR | Freq: Every day | SUBCUTANEOUS | 3 refills | Status: DC

## 2020-04-12 MED ORDER — LISINOPRIL 20 MG PO TABS: 20.0000 mg | ORAL_TABLET | Freq: Every day | ORAL | 2 refills | Status: DC

## 2020-04-12 MED ORDER — GLIPIZIDE 5 MG PO TABS: 5.0000 mg | ORAL_TABLET | Freq: Every day | ORAL | 1 refills | Status: DC

## 2020-04-12 MED ORDER — ATORVASTATIN CALCIUM 20 MG PO TABS: 20.0000 mg | ORAL_TABLET | Freq: Every day | ORAL | 3 refills | Status: DC

## 2020-04-12 MED FILL — LISINOPRIL 20 MG TABLET: 20 | 30 days supply | Qty: 30 | Fill #2

## 2020-04-12 MED FILL — OZEMPIC 0.25 OR 0.5 MG/DOSE: 2 | 28 days supply | Qty: 2 | Fill #1

## 2020-04-12 MED FILL — ATORVASTATIN CALCIUM 20 MG: 20 | 30 days supply | Qty: 30 | Fill #2

## 2020-04-12 MED FILL — GABAPENTIN 300 MG CAPSULE: 300 | 30 days supply | Qty: 90 | Fill #0

## 2020-04-12 MED FILL — AMLODIPINE BESYLATE 5 MG TA: 5 | 30 days supply | Qty: 30 | Fill #0

## 2020-04-12 NOTE — Addendum Note (Signed)
Addended by: Geryl Rankins on: 04/12/2020 03:03 PM   Modules accepted: Orders

## 2020-04-12 NOTE — Progress Notes (Signed)
Assessment & Plan:  Wendy Olson was seen today for gynecologic exam.  Diagnoses and all orders for this visit:  Encounter for Papanicolaou smear for cervical cancer screening  Type 2 diabetes mellitus with diabetic polyneuropathy, with long-term current use of insulin (HCC) -     Glucose (CBG) -     gabapentin (NEURONTIN) 300 MG capsule; Take 1 capsule (300 mg total) by mouth 3 (three) times daily. -     glipiZIDE (GLUCOTROL) 5 MG tablet; Take 1 tablet (5 mg total) by mouth daily before breakfast. -     insulin glargine (LANTUS) 100 UNIT/ML Solostar Pen; Inject 20 Units into the skin daily at 10 pm. -     Insulin Pen Needle (B-D UF III MINI PEN NEEDLES) 31G X 5 MM MISC; Use as instructed -     lisinopril (ZESTRIL) 20 MG tablet; Take 1 tablet (20 mg total) by mouth daily. Continue blood sugar control as discussed in office today, low carbohydrate diet, and regular physical exercise as tolerated, 150 minutes per week (30 min each day, 5 days per week, or 50 min 3 days per week). Keep blood sugar logs with fasting goal of 90-130 mg/dl, post prandial (after you eat) less than 180.  For Hypoglycemia: BS <60 and Hyperglycemia BS >400; contact the clinic ASAP. Annual eye exams and foot exams are recommended.  Bilateral hip pain -     Ambulatory referral to Physical Therapy  Essential hypertension -     amLODipine (NORVASC) 5 MG tablet; Take 1 tablet (5 mg total) by mouth daily. -     lisinopril (ZESTRIL) 20 MG tablet; Take 1 tablet (20 mg total) by mouth daily. Continue all antihypertensives as prescribed.  Remember to bring in your blood pressure log with you for your follow up appointment.  DASH/Mediterranean Diets are healthier choices for HTN.   Mixed hyperlipidemia -     atorvastatin (LIPITOR) 20 MG tablet; Take 1 tablet (20 mg total) by mouth daily. INSTRUCTIONS: Work on a low fat, heart healthy diet and participate in regular aerobic exercise program by working out at least 150 minutes  per week; 5 days a week-30 minutes per day. Avoid red meat/beef/steak,  fried foods. junk foods, sodas, sugary drinks, unhealthy snacking, alcohol and smoking.  Drink at least 80 oz of water per day and monitor your carbohydrate intake daily.     Patient has been counseled on age-appropriate routine health concerns for screening and prevention. These are reviewed and up-to-date. Referrals have been placed accordingly. Immunizations are up-to-date or declined.    Subjective:   Chief Complaint  Patient presents with  . Gynecologic Exam    Pt. is here for a pap smear.    [name] 44 y.o. female presents to office today for PAP smear.   Blood pressure well controlled. Taking lisinopril 20 mg and amlodipine 5 mg.  BP Readings from Last 3 Encounters:  04/12/20 107/69  03/15/20 121/77  12/07/19 (!) 156/102    Hip Pain: Patient complains of bilateral hip pain. Onset of the symptoms was several months ago. Inciting event: none. Current symptoms include is worse with weight bearing and is worse after period of inactivity. Associated symptoms:  popping sensation. Aggravating symptoms: any weight bearing and rising after sitting. Patient's overall course: symptoms have progressed to a point and plateaued, course of pain: symptoms have progressed to a point and plateaued and course of stiffness: symptoms have progressed to a point and plateaued. Patient has had no  prior hip problems. Previous visits for this problem: none. Evaluation to date: none.  Treatment to date: prescription analgesics, which have been ineffective.  08-14-2019 CT abdomen and pelvis FINDINGS: Femoral heads remain normally located. Hip joint spaces are symmetric and preserved. The pelvis appears stable and intact. Intact proximal left femur. Grossly intact proximal right femur. Negative visible lower abdominal and pelvic visceral contours. IMPRESSION: Normal for age radiographic appearance of the left hip and  pelvis.   Review of Systems  Constitutional: Negative for fever, malaise/fatigue and weight loss.  HENT: Negative.  Negative for nosebleeds.   Eyes: Negative.  Negative for blurred vision, double vision and photophobia.  Respiratory: Negative.  Negative for cough and shortness of breath.   Cardiovascular: Negative.  Negative for chest pain, palpitations and leg swelling.  Gastrointestinal: Negative.  Negative for heartburn, nausea and vomiting.  Musculoskeletal: Positive for joint pain and myalgias.  Neurological: Negative.  Negative for dizziness, focal weakness, seizures and headaches.  Psychiatric/Behavioral: Negative.  Negative for suicidal ideas.    Past Medical History:  Diagnosis Date  . 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 Anxiety   . Asthma   . Diabetes mellitus without complication ([place])    type 2  . Hypertension   . Seasonal allergies     Past Surgical History:  Procedure Laterality Date  . TUBAL LIGATION  2009    Family History  Problem Relation Age of Onset  . Diabetes Mother   . Hypertension Mother   . Dementia Mother   . Diabetes Father   . Hypertension Father     Social History Reviewed with no changes to be made today.   Outpatient Medications Prior to Visit  Medication Sig Dispense Refill  . ibuprofen (ADVIL) 800 MG tablet Take 1 tablet (800 mg total) by mouth every 8 (eight) hours as needed. 60 tablet 1  . amLODipine (NORVASC) 5 MG tablet Take 1 tablet (5 mg total) by mouth daily. 30 tablet 0  . atorvastatin (LIPITOR) 20 MG tablet Take 1 tablet (20 mg total) by mouth daily. 90 tablet 3  . gabapentin (NEURONTIN) 300 MG capsule Take 1 capsule (300 mg total) by mouth 3 (three) times daily. 90 capsule 3  . glipiZIDE (GLUCOTROL) 5 MG tablet Take 1 tablet (5 mg total) by mouth daily before breakfast. 90 tablet 1  . insulin glargine (LANTUS) 100 UNIT/ML Solostar Pen Inject 20 Units into the skin daily at 10 pm. 5 pen 3  . Insulin Pen Needle (B-D UF III MINI PEN  NEEDLES) 31G X 5 MM MISC Use as instructed 200 each 6  . lisinopril (ZESTRIL) 20 MG tablet Take 20 mg by mouth daily.    . Semaglutide,0.25 or 0.5MG /DOS, (OZEMPIC, 0.25 OR 0.5 MG/DOSE,) 2 MG/1.5ML SOPN Inject subcutaneously  0.25 mg once weekly for 4 weeks, then increase to 0.5 mg once weekly for 4 weeks; may increase to 1 mg once weekly after 4 weeks if fasting blood glucose levels greater than 130. (Patient not taking: Reported on 04/12/2020) 4 pen 2   No facility-administered medications prior to visit.    Allergies  Allergen Reactions  . Lobster [Shellfish Allergy] Anaphylaxis    Just lobster   . Reglan [Metoclopramide]     akasthesia       Objective:    BP 107/69 (BP Location: Left Arm, Patient Position: Sitting, Cuff Size: Normal)   Pulse 77   Temp 97.7 F (36.5 C) (Temporal)   Ht 5\' 7"  (1.702 m)   Wt  221 lb (100.2 kg)   SpO2 96%   BMI 34.61 kg/m  Wt Readings from Last 3 Encounters:  04/12/20 221 lb (100.2 kg)  03/15/20 219 lb 9.6 oz (99.6 kg)  12/07/19 227 lb 3.2 oz (103.1 kg)    Physical Exam Exam conducted with a chaperone present.  Constitutional:      Appearance: She is well-developed.  HENT:     Head: Normocephalic.  Cardiovascular:     Rate and Rhythm: Normal rate and regular rhythm.     Heart sounds: Normal heart sounds.  Pulmonary:     Effort: Pulmonary effort is normal.     Breath sounds: Normal breath sounds.  Abdominal:     General: Bowel sounds are normal.     Palpations: Abdomen is soft.     Hernia: There is no hernia in the left inguinal area.  Genitourinary:    Exam position: Lithotomy position.     Labia:        Right: No rash, tenderness, lesion or injury.        Left: No rash, tenderness, lesion or injury.      Vagina: Normal. No signs of injury and foreign body. No vaginal discharge, erythema, tenderness or bleeding.     Cervix: No cervical motion tenderness or friability.     Uterus: Not deviated and not enlarged.      Adnexa:         Right: No mass, tenderness or fullness.         Left: No mass, tenderness or fullness.       Rectum: Normal. No external hemorrhoid.  Musculoskeletal:     Right hip: Normal.     Left hip: Normal.  Lymphadenopathy:     Lower Body: No right inguinal adenopathy. No left inguinal adenopathy.  Skin:    General: Skin is warm and dry.  Neurological:     Mental Status: She is alert and oriented to person, place, and time.  Psychiatric:        Behavior: Behavior normal.        Thought Content: Thought content normal.        Judgment: Judgment normal.          Patient has been counseled extensively about nutrition and exercise as well as the importance of adherence with medications and regular follow-up. The patient was given clear instructions to go to ER or return to medical center if symptoms don't improve, worsen or new problems develop. The patient verbalized understanding.   Follow-up: No follow-ups on file.   Gildardo Pounds, FNP-BC [facility] and [facility] [place], [place]   04/12/2020, 2:56 PM

## 2020-04-14 ENCOUNTER — Encounter: Payer: Self-pay | Admitting: Nurse Practitioner

## 2020-04-14 LAB — CERVICOVAGINAL ANCILLARY ONLY
Bacterial Vaginitis (gardnerella): NEGATIVE
Candida Glabrata: NEGATIVE
Candida Vaginitis: NEGATIVE
Chlamydia: NEGATIVE
Comment: NEGATIVE
Comment: NEGATIVE
Comment: NEGATIVE
Comment: NEGATIVE
Comment: NEGATIVE
Comment: NORMAL
Neisseria Gonorrhea: NEGATIVE
Trichomonas: NEGATIVE

## 2020-04-14 LAB — CYTOLOGY - PAP
Adequacy: ABSENT
Comment: NEGATIVE
Diagnosis: NEGATIVE
High risk HPV: NEGATIVE

## 2020-04-27 ENCOUNTER — Encounter: Payer: Self-pay | Admitting: Nurse Practitioner

## 2020-04-27 ENCOUNTER — Ambulatory Visit: Payer: 59 | Attending: Nurse Practitioner | Admitting: Rehabilitative and Restorative Service Providers"

## 2020-04-28 ENCOUNTER — Telehealth: Payer: Self-pay

## 2020-04-28 NOTE — Telephone Encounter (Signed)
Telephoned patient at home number. Left a voice message with [facility] contact information. 

## 2020-05-05 ENCOUNTER — Telehealth: Payer: Self-pay

## 2020-05-05 NOTE — Telephone Encounter (Signed)
Telephoned patient at home number. Left voice message with [facility] contact information. 

## 2020-05-10 ENCOUNTER — Ambulatory Visit: Payer: Self-pay | Admitting: Nurse Practitioner

## 2020-06-20 ENCOUNTER — Encounter: Payer: Self-pay | Admitting: Nurse Practitioner

## 2020-06-21 ENCOUNTER — Other Ambulatory Visit: Payer: Self-pay | Admitting: Nurse Practitioner

## 2020-06-21 DIAGNOSIS — E1142 Type 2 diabetes mellitus with diabetic polyneuropathy: Secondary | ICD-10-CM

## 2020-06-21 MED ORDER — OZEMPIC (0.25 OR 0.5 MG/DOSE) 2 MG/1.5ML ~~LOC~~ SOPN: 0.5000 mg | PEN_INJECTOR | SUBCUTANEOUS | 2 refills | Status: DC

## 2020-06-21 MED ORDER — INSULIN GLARGINE 100 UNIT/ML SOLOSTAR PEN: 20.0000 [IU] | PEN_INJECTOR | Freq: Every day | SUBCUTANEOUS | 3 refills | Status: DC

## 2020-06-21 MED ORDER — BD PEN NEEDLE MINI U/F 31G X 5 MM MISC: 6 refills | Status: DC

## 2020-06-21 MED ORDER — GLIPIZIDE 5 MG PO TABS: 5.0000 mg | ORAL_TABLET | Freq: Every day | ORAL | 1 refills | Status: DC

## 2020-06-21 MED FILL — BD PEN NDL MINI 31GX5MM: 31G X 5 MM | 25 days supply | Qty: 100 | Fill #0

## 2020-06-21 MED FILL — LANTUS SOLOSTAR 100 UNITS/M: 100 | 30 days supply | Qty: 6 | Fill #0

## 2020-06-21 MED FILL — glipiZIDE 5 MG TABS: 5 | 90 days supply | Qty: 90 | Fill #0

## 2020-06-21 MED FILL — OZEMPIC 0.25 OR 0.5 MG/DOSE: 2 | 28 days supply | Qty: 2 | Fill #0

## 2020-09-21 ENCOUNTER — Encounter: Payer: Self-pay | Admitting: Nurse Practitioner

## 2020-12-02 ENCOUNTER — Ambulatory Visit: Payer: Self-pay | Admitting: Nurse Practitioner

## 2021-01-07 IMAGING — DX PORTABLE CHEST - 1 VIEW
1 series · 1 of 1 positions shown · non-contrast
Comparison: 02/14/2018

CLINICAL DATA: Patient reports sob and center chest pains started
yesterday while at work. Cough only when loses her breath. Nausea
and diarrhea. Denies any prior heart or lung surgeries. Hx of
asthma, htn, diabetes. Ex smoker quit 11 years ago

EXAM:
PORTABLE CHEST - 1 VIEW

[chest ap]
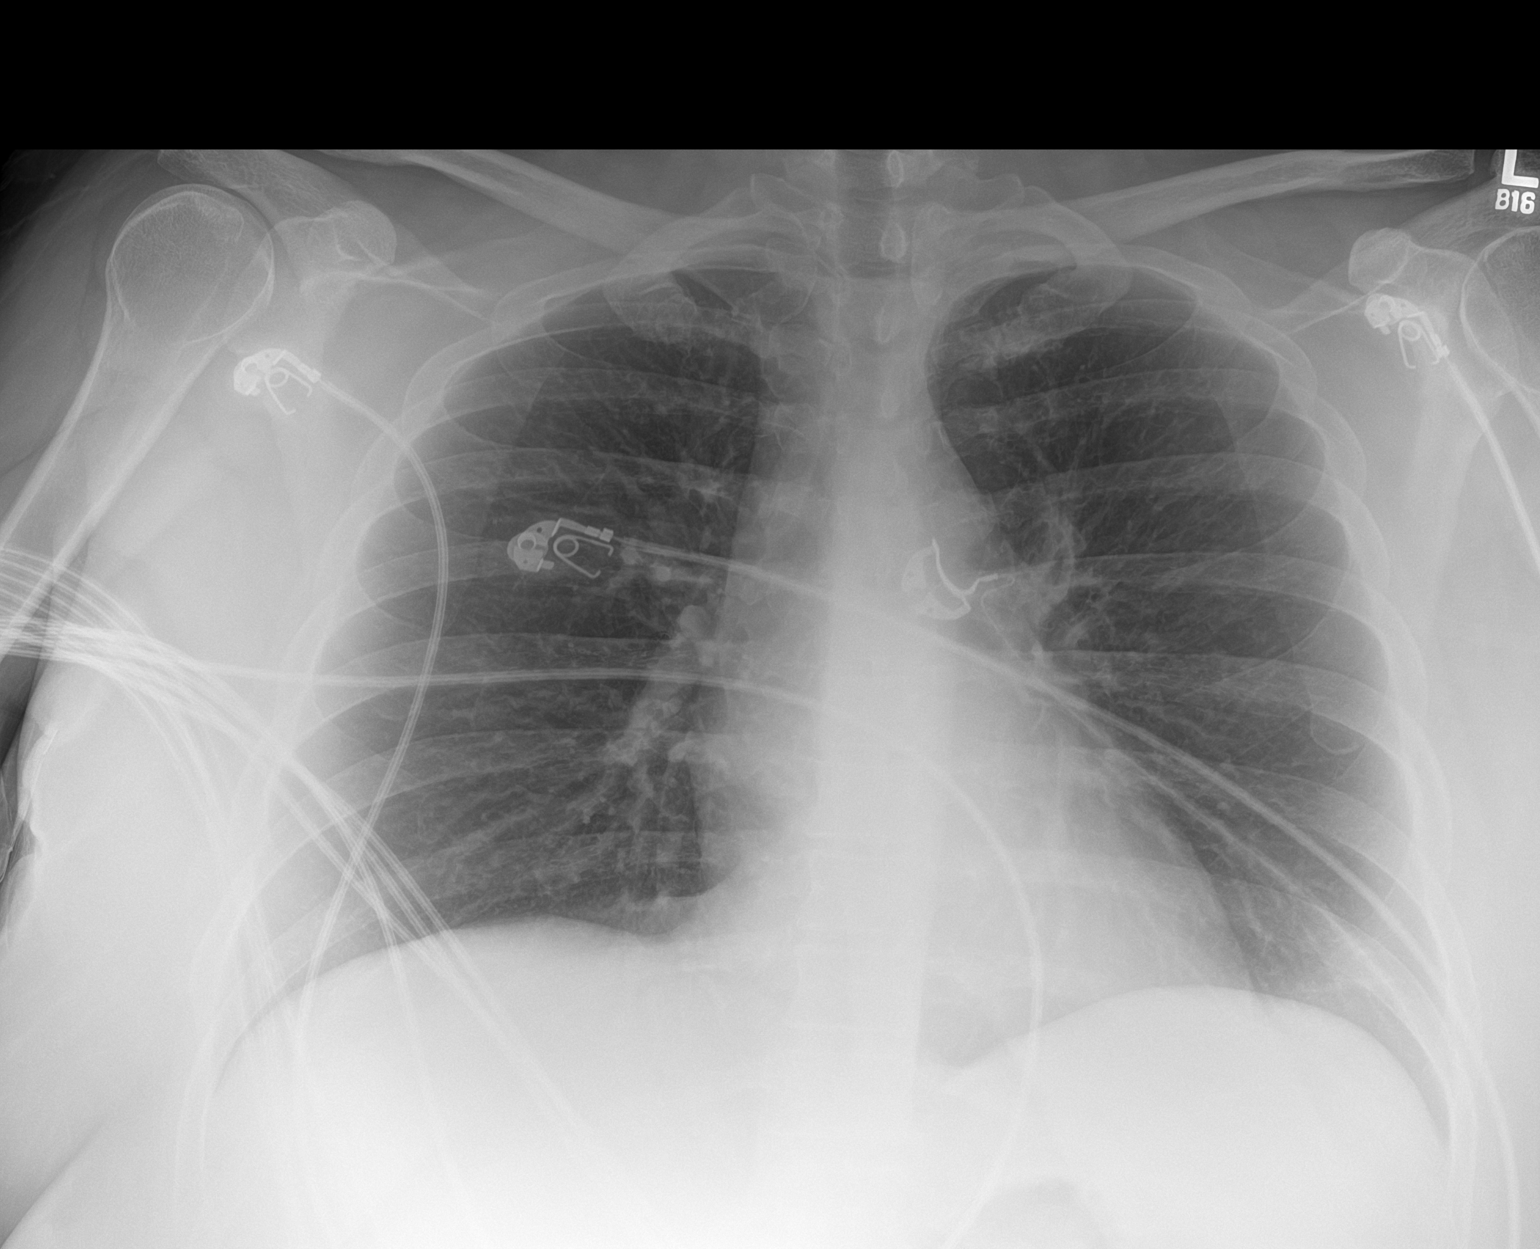

[1 of 1 positions shown; findings below may reference images not displayed]

FINDINGS: Lungs are clear.

Heart size and mediastinal contours are within normal limits.

No effusion.  No pneumothorax.

Visualized bones unremarkable.
IMPRESSION: No acute cardiopulmonary disease.

## 2021-04-18 ENCOUNTER — Other Ambulatory Visit: Payer: Self-pay | Admitting: Nurse Practitioner

## 2021-04-18 DIAGNOSIS — Z794 Long term (current) use of insulin: Secondary | ICD-10-CM

## 2021-04-18 DIAGNOSIS — E1142 Type 2 diabetes mellitus with diabetic polyneuropathy: Secondary | ICD-10-CM

## 2021-04-19 ENCOUNTER — Other Ambulatory Visit: Payer: Self-pay

## 2021-04-19 MED ORDER — GLIPIZIDE 5 MG PO TABS
5.0000 mg | ORAL_TABLET | Freq: Every day | ORAL | 0 refills | Status: DC
Start: 2021-04-19 — End: 2021-08-07
  Filled 2021-04-19 – 2021-07-31 (×2): qty 30, 30d supply, fill #0

## 2021-04-19 MED ORDER — INSULIN GLARGINE 100 UNIT/ML SOLOSTAR PEN
20.0000 [IU] | PEN_INJECTOR | Freq: Every day | SUBCUTANEOUS | 0 refills | Status: DC
  Filled 2021-04-19 – 2021-07-31 (×2): qty 6, 30d supply, fill #0

## 2021-04-26 ENCOUNTER — Other Ambulatory Visit: Payer: Self-pay

## 2021-07-31 ENCOUNTER — Other Ambulatory Visit: Payer: Self-pay | Admitting: Nurse Practitioner

## 2021-07-31 DIAGNOSIS — I10 Essential (primary) hypertension: Secondary | ICD-10-CM

## 2021-07-31 DIAGNOSIS — E1142 Type 2 diabetes mellitus with diabetic polyneuropathy: Secondary | ICD-10-CM

## 2021-08-01 ENCOUNTER — Other Ambulatory Visit: Payer: Self-pay

## 2021-08-02 ENCOUNTER — Encounter (HOSPITAL_COMMUNITY): Payer: Self-pay | Admitting: *Deleted

## 2021-08-02 ENCOUNTER — Other Ambulatory Visit: Payer: Self-pay

## 2021-08-02 ENCOUNTER — Emergency Department (HOSPITAL_COMMUNITY)
Admission: EM | Admit: 2021-08-02 | Discharge: 2021-08-03 | Disposition: A | Discharge status: Home / Self Care | Payer: 59 | Attending: Emergency Medicine | Admitting: Emergency Medicine

## 2021-08-02 DIAGNOSIS — R531 Weakness: Secondary | ICD-10-CM | POA: Diagnosis not present

## 2021-08-02 DIAGNOSIS — R42 Dizziness and giddiness: Secondary | ICD-10-CM | POA: Insufficient documentation

## 2021-08-02 DIAGNOSIS — R110 Nausea: Secondary | ICD-10-CM | POA: Insufficient documentation

## 2021-08-02 DIAGNOSIS — Z20822 Contact with and (suspected) exposure to covid-19: Secondary | ICD-10-CM | POA: Diagnosis not present

## 2021-08-02 DIAGNOSIS — R519 Headache, unspecified: Secondary | ICD-10-CM | POA: Insufficient documentation

## 2021-08-02 DIAGNOSIS — Z5321 Procedure and treatment not carried out due to patient leaving prior to being seen by health care provider: Secondary | ICD-10-CM | POA: Diagnosis not present

## 2021-08-02 LAB — BASIC METABOLIC PANEL
Anion gap: 12 (ref 5–15)
BUN: 13 mg/dL (ref 6–20)
CO2: 21 mmol/L — ABNORMAL LOW (ref 22–32)
Calcium: 9.6 mg/dL (ref 8.9–10.3)
Chloride: 96 mmol/L — ABNORMAL LOW (ref 98–111)
Creatinine, Ser: 0.7 mg/dL (ref 0.44–1.00)
GFR, Estimated: >60 mL/min (ref >60)
Glucose, Bld: 510 mg/dL (ref 70–99)
Potassium: 4.7 mmol/L (ref 3.5–5.1)
Sodium: 129 mmol/L — ABNORMAL LOW (ref 135–145)

## 2021-08-02 LAB — CBC
HCT: 44.4 % (ref 36.0–46.0)
Hemoglobin: 15.6 g/dL — ABNORMAL HIGH (ref 12.0–15.0)
MCH: 30.7 pg (ref 26.0–34.0)
MCHC: 35.1 g/dL (ref 30.0–36.0)
MCV: 87.4 fL (ref 80.0–100.0)
Platelets: 285 10*3/uL (ref 150–400)
RBC: 5.08 MIL/uL (ref 3.87–5.11)
RDW: 12.3 % (ref 11.5–15.5)
WBC: 10.8 10*3/uL — ABNORMAL HIGH (ref 4.0–10.5)
nRBC: 0 % (ref 0.0–0.2)

## 2021-08-02 LAB — CBG MONITORING, ED
Glucose-Capillary: 512 mg/dL (ref 70–99)
Glucose-Capillary: 353 mg/dL — ABNORMAL HIGH (ref 70–99)

## 2021-08-02 LAB — I-STAT BETA HCG BLOOD, ED (MC, WL, AP ONLY): I-stat hCG, quantitative: <5 m[IU]/mL (ref <5)

## 2021-08-02 LAB — SARS CORONAVIRUS 2 (TAT 6-24 HRS): SARS Coronavirus 2: NEGATIVE

## 2021-08-02 NOTE — ED Provider Notes (Signed)
Emergency Medicine Provider Triage Evaluation Note  Wendy Olson , a 45 y.o. female  was evaluated in triage.  Pt complains of hyperglycemia.  She states she has not missed any doses and took her insulin, Semaglutide, and Glipizide as prescribed.  Her sugars were in the 600s earlier today.  She reports associated headache and blurred vision.  Review of Systems  Positive:  Negative: No focal weakness or numbness, fever, chills, cough, congestion  Physical Exam  BP (!) 136/96 (BP Location: Left Arm)   Pulse (!) 109   Temp 98.2 F (36.8 C) (Oral)   Resp 16   Ht '5\' 7"'$  (1.702 m)   Wt 100.2 kg   SpO2 96%   BMI 34.61 kg/m  Gen:   Awake, no distress   Resp:  Normal effort, clear to auscultation bilaterally MSK:   Moves extremities without difficulty  Other:  Heart sounds are normal and regular  Medical Decision Making  Medically screening exam initiated at 7:18 PM.  Appropriate orders placed.  STACY ATAYDE was informed that the remainder of the evaluation will be completed by another provider, this initial triage assessment does not replace that evaluation, and the importance of remaining in the ED until their evaluation is complete.     Hendricks Limes, PA-C 08/02/21 [place], [place], DO 08/02/21 2347

## 2021-08-02 NOTE — ED Triage Notes (Signed)
Pt here via [facility] from work.  States cbg this am was 190.  As she was working, she began experiencing generalized weakness, headaches, nausea and dizziness.    Initial cbg was 510.  Given 500 ns and 4 mg zofran that improved pt's nausea.    Vs  140/100 Hr 406 Rr 16 O2 sats 98%

## 2021-08-02 NOTE — ED Notes (Signed)
Lab called with high glucose already a acuity 2  No beds availavle in treatment area

## 2021-08-03 NOTE — ED Notes (Signed)
Pt stated she was leaving ED.Pt encouraged to stay. Pt declined offer.

## 2021-08-04 ENCOUNTER — Other Ambulatory Visit: Payer: Self-pay | Admitting: Nurse Practitioner

## 2021-08-04 ENCOUNTER — Encounter: Payer: Self-pay | Admitting: Nurse Practitioner

## 2021-08-04 DIAGNOSIS — E1142 Type 2 diabetes mellitus with diabetic polyneuropathy: Secondary | ICD-10-CM

## 2021-08-04 NOTE — Telephone Encounter (Signed)
Requested medications are due for refill today.  yes  Requested medications are on the active medications list.  yes  Last refill. 04/19/2021 for both  Future visit scheduled.   yes  Notes to clinic.  Pt is more than 3 months overdue for OV. See notes from 9/9 from [facility].

## 2021-08-04 NOTE — Telephone Encounter (Signed)
Medication Refill - Medication:  insulin glargine (LANTUS) 100 UNIT/ML Solostar Pen  glipiZIDE (GLUCOTROL) 5 MG tablet   Has the patient contacted their pharmacy? No.  Preferred Pharmacy (with phone number or street name): *Pt requested if this one time she can get her medications free until she'll be able to get her first check as well as getting a refill until her appt, 10/11. She also asked if she could get the medication sent to her home address because she does not have a car. Please advise.  Agent: Please be advised that RX refills may take up to 3 business days. We ask that you follow-up with your pharmacy.

## 2021-08-06 ENCOUNTER — Encounter: Payer: Self-pay | Admitting: Nurse Practitioner

## 2021-08-07 ENCOUNTER — Ambulatory Visit: Payer: Self-pay

## 2021-08-07 ENCOUNTER — Encounter: Payer: Self-pay | Admitting: Nurse Practitioner

## 2021-08-07 ENCOUNTER — Other Ambulatory Visit: Payer: Self-pay

## 2021-08-07 ENCOUNTER — Other Ambulatory Visit: Payer: Self-pay | Admitting: Nurse Practitioner

## 2021-08-07 ENCOUNTER — Ambulatory Visit (INDEPENDENT_AMBULATORY_CARE_PROVIDER_SITE_OTHER): Payer: 59 | Admitting: Nurse Practitioner

## 2021-08-07 VITALS — BP 146/91 | HR 82 | Temp 97.9°F | Resp 18 | Wt 222.0 lb

## 2021-08-07 DIAGNOSIS — I10 Essential (primary) hypertension: Secondary | ICD-10-CM | POA: Diagnosis not present

## 2021-08-07 DIAGNOSIS — Z794 Long term (current) use of insulin: Secondary | ICD-10-CM | POA: Diagnosis not present

## 2021-08-07 DIAGNOSIS — E1142 Type 2 diabetes mellitus with diabetic polyneuropathy: Secondary | ICD-10-CM

## 2021-08-07 DIAGNOSIS — E1165 Type 2 diabetes mellitus with hyperglycemia: Secondary | ICD-10-CM | POA: Diagnosis not present

## 2021-08-07 LAB — GLUCOSE, POCT (MANUAL RESULT ENTRY): POC Glucose: 284 mg/dl — AB (ref 70–99)

## 2021-08-07 LAB — POCT GLYCOSYLATED HEMOGLOBIN (HGB A1C): Hemoglobin A1C: 12.3 % — AB (ref 4.0–5.6)

## 2021-08-07 MED ORDER — BD PEN NEEDLE MINI U/F 31G X 5 MM MISC
6 refills | Status: DC
  Filled 2021-08-07: qty 200, fill #0

## 2021-08-07 MED ORDER — INSULIN GLARGINE 100 UNIT/ML SOLOSTAR PEN
20.0000 [IU] | PEN_INJECTOR | Freq: Every day | SUBCUTANEOUS | 0 refills | Status: DC
  Filled 2021-08-21 – 2021-08-30 (×2): qty 6, 30d supply, fill #0

## 2021-08-07 MED ORDER — OZEMPIC (0.25 OR 0.5 MG/DOSE) 2 MG/1.5ML ~~LOC~~ SOPN
0.5000 mg | PEN_INJECTOR | SUBCUTANEOUS | 0 refills | Status: DC
  Filled 2021-08-07: qty 1.5, 28d supply, fill #0

## 2021-08-07 MED ORDER — AMLODIPINE BESYLATE 5 MG PO TABS
5.0000 mg | ORAL_TABLET | Freq: Every day | ORAL | 0 refills | Status: DC
  Filled 2021-08-07: qty 90, 90d supply, fill #0

## 2021-08-07 MED ORDER — GLIPIZIDE 5 MG PO TABS
5.0000 mg | ORAL_TABLET | Freq: Every day | ORAL | 0 refills | Status: DC
  Filled 2021-08-21 – 2021-08-22 (×2): qty 30, 30d supply, fill #0
  Filled ????-??-??: fill #0

## 2021-08-07 MED ORDER — BD PEN NEEDLE MINI U/F 31G X 5 MM MISC
6 refills | Status: DC
  Filled 2021-08-07 – 2021-08-30 (×2): qty 100, 25d supply, fill #0

## 2021-08-07 NOTE — Progress Notes (Signed)
Pt presents for diabetes follow-up and medication refills

## 2021-08-07 NOTE — Telephone Encounter (Signed)
  Pt stated she needs appt for medication Refill- she is out of her short and long term insulin and glipizide. Pt's am BS today 512 and 434. Pt stated her usual range is 266-291. Pt stated she is drink 34 bottled waters a day. She feels hungry. Pt stated several days ago she had some dizziness but none today.  Pt stated she is out of a job and cannot pay. She needs prescriptions and to be seen. Reached out to [employer] and [employer] for advice. Was instructed to make appt at [facility] at [facility] and use [facility] transportation. No OV today at [facility] at [facility].   Pt needs appointment today. Called PC at [facility] and no one answered. Appt made for today and [facility] transportation will take her to appt. Care advice given to pt and verbalized understanding.     Reason for Disposition  Blood glucose > 400 mg/dL (22.2 mmol/L)  Answer Assessment - Initial Assessment Questions 1. BLOOD GLUCOSE: "What is your blood glucose level?"      434 2. ONSET: "When did you check the blood glucose?"     0812 3. USUAL RANGE: "What is your glucose level usually?" (e.g., usual fasting morning value, usual evening value)     291 pm:266 4. KETONES: "Do you check for ketones (urine or blood test strips)?" If yes, ask: "What does the test show now?"      N/a 5. TYPE 1 or 2:  "Do you know what type of diabetes you have?"  (e.g., Type 1, Type 2, Gestational; doesn't know)      Type 2  6. INSULIN: "Do you take insulin?" "What type of insulin(s) do you use? What is the mode of delivery? (syringe, pen; injection or pump)?"      Lantus pen 7. DIABETES PILLS: "Do you take any pills for your diabetes?" If yes, ask: "Have you missed taking any pills recently?"     Glipizide- yes  Ozempic 8. OTHER SYMPTOMS: "Do you have any symptoms?" (e.g., fever, frequent urination, difficulty breathing, dizziness, weakness, vomiting)     Small amounts of fluids, thirsty, hunger, 9. PREGNANCY: "Is there any chance you are pregnant?"  "When was your last menstrual period?"     No-4 months ago  Protocols used: Diabetes - High Blood Sugar-A-AH

## 2021-08-07 NOTE — Progress Notes (Signed)
$'@Patient'i$  ID: Wendy Olson, female    DOB: 1976/09/05, 45 y.o.   MRN: [id number]  Chief Complaint  Patient presents with   Diabetes   Medication Refill    Referring provider: Gildardo Pounds, NP  HPI  Patient presents today for follow-up on diabetes.  Patient was last seen by Geryl Rankins in May 2021.  She states that she has been out of her medications for several months now.  She would like refills on her medications today.  Patient's hemoglobin A1c in office today was up to 12.3 from 10.9 - 1-year ago. Denies f/c/s, n/v/d, hemoptysis, PND, chest pain or edema.    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   atorvastatin (LIPITOR) 20 MG tablet Take 1 tablet (20 mg total) by mouth daily.   gabapentin (NEURONTIN) 300 MG capsule Take 1 capsule (300 mg total) by mouth 3 (three) times daily.   glipiZIDE (GLUCOTROL) 5 MG tablet Take 1 tablet (5 mg total) by mouth daily before breakfast.   ibuprofen (ADVIL) 800 MG tablet Take 1 tablet (800 mg total) by mouth every 8 (eight) hours as needed.   insulin glargine (LANTUS) 100 UNIT/ML Solostar Pen Inject 20 Units into the skin daily at 10 pm.   lisinopril  (ZESTRIL) 20 MG tablet Take 1 tablet (20 mg total) by mouth daily.   Semaglutide,0.25 or 0.'5MG'$ /DOS, (OZEMPIC, 0.25 OR 0.5 MG/DOSE,) 2 MG/1.5ML SOPN Inject 0.5 mg into the skin once a week.   [DISCONTINUED] amLODipine (NORVASC) 5 MG tablet Take 1 tablet (5 mg total) by mouth daily.   [DISCONTINUED] glipiZIDE (GLUCOTROL) 5 MG tablet Take 1 tablet (5 mg total) by mouth daily before breakfast.   [DISCONTINUED] insulin glargine (LANTUS) 100 UNIT/ML Solostar Pen Inject 20 Units into the skin daily at 10 pm.   [DISCONTINUED] Insulin Pen Needle (B-D UF III MINI PEN NEEDLES) 31G X 5 MM MISC Use as instructed   [DISCONTINUED] Insulin Pen Needle (B-D UF III MINI PEN NEEDLES) 31G X 5 MM MISC Use as instructed   [DISCONTINUED] Semaglutide,0.25 or 0.'5MG'$ /DOS, (OZEMPIC, 0.25 OR 0.5 MG/DOSE,) 2 MG/1.5ML SOPN Inject 0.375 mLs (0.5 mg total) into the skin once a week.   No facility-administered encounter medications on file as of 08/07/2021.     Review of Systems  Review of Systems  Constitutional: Negative.   HENT: Negative.    Cardiovascular: Negative.   Gastrointestinal: Negative.   Allergic/Immunologic: Negative.   Neurological: Negative.   Psychiatric/Behavioral: Negative.        Physical Exam  BP (!) 146/91 (BP Location: Left Arm, Patient Position: Sitting, Cuff Size: Large)   Pulse  82   Temp 97.9 F (36.6 C)   Resp 18   Wt 222 lb (100.7 kg)   SpO2 97%   BMI 34.77 kg/m   Wt Readings from Last 5 Encounters:  08/07/21 222 lb (100.7 kg)  08/02/21 221 lb (100.2 kg)  04/12/20 221 lb (100.2 kg)  03/15/20 219 lb 9.6 oz (99.6 kg)  12/07/19 227 lb 3.2 oz (103.1 kg)     Physical Exam Vitals and nursing note reviewed.  Constitutional:      General: She is not in acute distress.    Appearance: She is well-developed.  Cardiovascular:     Rate and Rhythm: Normal rate and regular rhythm.  Pulmonary:     Effort: Pulmonary effort is normal.     Breath sounds: Normal breath sounds.   Neurological:     Mental Status: She is alert and oriented to person, place, and time.     Lab Results:  CBC    Component Value Date/Time   WBC 10.8 (H) 08/02/2021 1917   RBC 5.08 08/02/2021 1917   HGB 15.6 (H) 08/02/2021 1917   HGB 15.8 02/05/2020 0957   HCT 44.4 08/02/2021 1917   HCT 46.4 02/05/2020 0957   PLT 285 08/02/2021 1917   PLT 329 02/05/2020 0957   MCV 87.4 08/02/2021 1917   MCV 89 02/05/2020 0957   MCH 30.7 08/02/2021 1917   MCHC 35.1 08/02/2021 1917   RDW 12.3 08/02/2021 1917   RDW 13.0 02/05/2020 0957   LYMPHSABS 2.4 08/13/2019 2219   MONOABS 0.5 08/13/2019 2219   EOSABS 0.2 08/13/2019 2219   BASOSABS 0.1 08/13/2019 2219    BMET    Component Value Date/Time   NA 129 (L) 08/02/2021 1917   NA 138 02/05/2020 0957   K 4.7 08/02/2021 1917   CL 96 (L) 08/02/2021 1917   CO2 21 (L) 08/02/2021 1917   GLUCOSE 510 (HH) 08/02/2021 1917   BUN 13 08/02/2021 1917   BUN 14 02/05/2020 0957   CREATININE 0.70 08/02/2021 1917   CALCIUM 9.6 08/02/2021 1917   GFRNONAA >60 08/02/2021 1917   GFRAA 128 02/05/2020 0957     Assessment & Plan:   Type 2 diabetes mellitus with hyperglycemia, without long-term current use of insulin ([place]) Diabetes:  Will refill diabetic medications  Please check blood sugars at home  Stay active  Diabetic diet  Follow up:  Follow up with PCP in 3 months or sooner if needed     Fenton Foy, NP 08/08/2021

## 2021-08-07 NOTE — Patient Instructions (Addendum)
Diabetes:  Will refill diabetic medications  Please check blood sugars at home  Stay active  Diabetic diet  Follow up:  Follow up with PCP in 3 months or sooner if needed    Diabetes Mellitus and Nutrition, Adult When you have diabetes, or diabetes mellitus, it is very important to have healthy eating habits because your blood sugar (glucose) levels are greatly affected by what you eat and drink. Eating healthy foods in the right amounts, at about the same times every day, can help you: Control your blood glucose. Lower your risk of heart disease. Improve your blood pressure. Reach or maintain a healthy weight. What can affect my meal plan? Every person with diabetes is different, and each person has different needs for a meal plan. Your health care provider may recommend that you work with a dietitian to make a meal plan that is best for you. Your meal plan may vary depending on factors such as: The calories you need. The medicines you take. Your weight. Your blood glucose, blood pressure, and cholesterol levels. Your activity level. Other health conditions you have, such as heart or kidney disease. How do carbohydrates affect me? Carbohydrates, also called carbs, affect your blood glucose level more than any other type of food. Eating carbs naturally raises the amount of glucose in your blood. Carb counting is a method for keeping track of how many carbs you eat. Counting carbs is important to keep your blood glucose at a healthy level, especially if you use insulin or take certain oral diabetes medicines. It is important to know how many carbs you can safely have in each meal. This is different for every person. Your dietitian can help you calculate how many carbs you should have at each meal and for each snack. How does alcohol affect me? Alcohol can cause a sudden decrease in blood glucose (hypoglycemia), especially if you use insulin or take certain oral diabetes medicines.  Hypoglycemia can be a life-threatening condition. Symptoms of hypoglycemia, such as sleepiness, dizziness, and confusion, are similar to symptoms of having too much alcohol. Do not drink alcohol if: Your health care provider tells you not to drink. You are pregnant, may be pregnant, or are planning to become pregnant. If you drink alcohol: Do not drink on an empty stomach. Limit how much you use to: 0-1 drink a day for women. 0-2 drinks a day for men. Be aware of how much alcohol is in your drink. In the [place] one drink equals one 12 oz bottle of beer (355 mL), one 5 oz glass of wine (148 mL), or one 1 oz glass of hard liquor (44 mL). Keep yourself hydrated with water, diet soda, or unsweetened iced tea. Keep in mind that regular soda, juice, and other mixers may contain a lot of sugar and must be counted as carbs. What are tips for following this plan? Reading food labels Start by checking the serving size on the "Nutrition Facts" label of packaged foods and drinks. The amount of calories, carbs, fats, and other nutrients listed on the label is based on one serving of the item. Many items contain more than one serving per package. Check the total grams (g) of carbs in one serving. You can calculate the number of servings of carbs in one serving by dividing the total carbs by 15. For example, if a food has 30 g of total carbs per serving, it would be equal to 2 servings of carbs. Check the number of grams (g)  of saturated fats and trans fats in one serving. Choose foods that have a low amount or none of these fats. Check the number of milligrams (mg) of salt (sodium) in one serving. Most people should limit total sodium intake to less than 2,300 mg per day. Always check the nutrition information of foods labeled as "low-fat" or "nonfat." These foods may be higher in added sugar or refined carbs and should be avoided. Talk to your dietitian to identify your daily goals for nutrients listed on the  label. Shopping Avoid buying canned, pre-made, or processed foods. These foods tend to be high in fat, sodium, and added sugar. Shop around the outside edge of the grocery store. This is where you will most often find fresh fruits and vegetables, bulk grains, fresh meats, and fresh dairy. Cooking Use low-heat cooking methods, such as baking, instead of high-heat cooking methods like deep frying. Cook using healthy oils, such as olive, canola, or sunflower oil. Avoid cooking with butter, cream, or high-fat meats. Meal planning Eat meals and snacks regularly, preferably at the same times every day. Avoid going long periods of time without eating. Eat foods that are high in fiber, such as fresh fruits, vegetables, beans, and whole grains. Talk with your dietitian about how many servings of carbs you can eat at each meal. Eat 4-6 oz (112-168 g) of lean protein each day, such as lean meat, chicken, fish, eggs, or tofu. One ounce (oz) of lean protein is equal to: 1 oz (28 g) of meat, chicken, or fish. 1 egg.  cup (62 g) of tofu. Eat some foods each day that contain healthy fats, such as avocado, nuts, seeds, and fish. What foods should I eat? Fruits Berries. Apples. Oranges. Peaches. Apricots. Plums. Grapes. Mango. Papaya. Pomegranate. Kiwi. Cherries. Vegetables Lettuce. Spinach. Leafy greens, including kale, chard, collard greens, and mustard greens. Beets. Cauliflower. Cabbage. Broccoli. Carrots. Green beans. Tomatoes. Peppers. Onions. Cucumbers. Brussels sprouts. Grains Whole grains, such as whole-wheat or whole-grain bread, crackers, tortillas, cereal, and pasta. Unsweetened oatmeal. Quinoa. Brown or wild rice. Meats and other proteins Seafood. Poultry without skin. Lean cuts of poultry and beef. Tofu. Nuts. Seeds. Dairy Low-fat or fat-free dairy products such as milk, yogurt, and cheese. The items listed above may not be a complete list of foods and beverages you can eat. Contact a  dietitian for more information. What foods should I avoid? Fruits Fruits canned with syrup. Vegetables Canned vegetables. Frozen vegetables with butter or cream sauce. Grains Refined white flour and flour products such as bread, pasta, snack foods, and cereals. Avoid all processed foods. Meats and other proteins Fatty cuts of meat. Poultry with skin. Breaded or fried meats. Processed meat. Avoid saturated fats. Dairy Full-fat yogurt, cheese, or milk. Beverages Sweetened drinks, such as soda or iced tea. The items listed above may not be a complete list of foods and beverages you should avoid. Contact a dietitian for more information. Questions to ask a health care provider Do I need to meet with a diabetes educator? Do I need to meet with a dietitian? What number can I call if I have questions? When are the best times to check my blood glucose? Where to find more information: American Diabetes Association: diabetes.org Academy of Nutrition and Dietetics: www.eatright.[employer] of Diabetes and Digestive and Kidney Diseases: [employer] Association of Diabetes Care and Education Specialists: www.diabeteseducator.org Summary It is important to have healthy eating habits because your blood sugar (glucose) levels are greatly affected by what you  eat and drink. A healthy meal plan will help you control your blood glucose and maintain a healthy lifestyle. Your health care provider may recommend that you work with a dietitian to make a meal plan that is best for you. Keep in mind that carbohydrates (carbs) and alcohol have immediate effects on your blood glucose levels. It is important to count carbs and to use alcohol carefully. This information is not intended to replace advice given to you by your health care provider. Make sure you discuss any questions you have with your health care provider. Document Revised: 10/20/2019 Document Reviewed: 10/20/2019 Elsevier Patient  Education  2021 [employer].

## 2021-08-08 ENCOUNTER — Other Ambulatory Visit: Payer: Self-pay

## 2021-08-08 ENCOUNTER — Encounter: Payer: Self-pay | Admitting: Nurse Practitioner

## 2021-08-08 NOTE — Assessment & Plan Note (Signed)
Diabetes:  Will refill diabetic medications  Please check blood sugars at home  Stay active  Diabetic diet  Follow up:  Follow up with PCP in 3 months or sooner if needed

## 2021-08-15 ENCOUNTER — Other Ambulatory Visit: Payer: Self-pay

## 2021-08-18 ENCOUNTER — Other Ambulatory Visit: Payer: Self-pay | Admitting: Nurse Practitioner

## 2021-08-18 DIAGNOSIS — Z794 Long term (current) use of insulin: Secondary | ICD-10-CM

## 2021-08-18 MED ORDER — GABAPENTIN 300 MG PO CAPS
300.0000 mg | ORAL_CAPSULE | Freq: Three times a day (TID) | ORAL | 3 refills | Status: DC
  Filled 2021-08-18 – 2022-01-05 (×3): qty 90, 30d supply, fill #0

## 2021-08-21 ENCOUNTER — Other Ambulatory Visit: Payer: Self-pay | Admitting: Nurse Practitioner

## 2021-08-21 ENCOUNTER — Other Ambulatory Visit: Payer: Self-pay

## 2021-08-22 ENCOUNTER — Other Ambulatory Visit: Payer: Self-pay

## 2021-08-22 ENCOUNTER — Other Ambulatory Visit: Payer: Self-pay | Admitting: Nurse Practitioner

## 2021-08-22 ENCOUNTER — Encounter: Payer: Self-pay | Admitting: Nurse Practitioner

## 2021-08-22 DIAGNOSIS — Z794 Long term (current) use of insulin: Secondary | ICD-10-CM

## 2021-08-22 DIAGNOSIS — E1142 Type 2 diabetes mellitus with diabetic polyneuropathy: Secondary | ICD-10-CM

## 2021-08-22 MED ORDER — GLIPIZIDE 5 MG PO TABS
5.0000 mg | ORAL_TABLET | Freq: Every day | ORAL | 0 refills | Status: DC
  Filled 2021-08-22 – 2021-09-01 (×2): qty 90, 90d supply, fill #0

## 2021-08-23 ENCOUNTER — Other Ambulatory Visit: Payer: Self-pay

## 2021-08-24 ENCOUNTER — Other Ambulatory Visit: Payer: Self-pay

## 2021-08-24 ENCOUNTER — Other Ambulatory Visit: Payer: Self-pay | Admitting: Nurse Practitioner

## 2021-08-24 DIAGNOSIS — I10 Essential (primary) hypertension: Secondary | ICD-10-CM

## 2021-08-24 DIAGNOSIS — E1142 Type 2 diabetes mellitus with diabetic polyneuropathy: Secondary | ICD-10-CM

## 2021-08-24 DIAGNOSIS — Z794 Long term (current) use of insulin: Secondary | ICD-10-CM

## 2021-08-24 MED ORDER — LISINOPRIL 20 MG PO TABS
20.0000 mg | ORAL_TABLET | Freq: Every day | ORAL | 2 refills | Status: DC
  Filled 2021-08-24 – 2021-09-01 (×2): qty 90, 90d supply, fill #0

## 2021-08-25 ENCOUNTER — Other Ambulatory Visit: Payer: Self-pay

## 2021-08-30 ENCOUNTER — Other Ambulatory Visit: Payer: Self-pay

## 2021-08-30 ENCOUNTER — Other Ambulatory Visit: Payer: Self-pay | Admitting: Nurse Practitioner

## 2021-08-30 NOTE — Telephone Encounter (Signed)
Requested medication (s) are due for refill today: Yes  Requested medication (s) are on the active medication list: Yes  Last refill:  08/07/21  Future visit scheduled: Yes  Notes to clinic:  Unable to refill per protocol, last refill by another provider.      Requested Prescriptions  Pending Prescriptions Disp Refills   Semaglutide,0.25 or 0.5MG /DOS, (OZEMPIC, 0.25 OR 0.5 MG/DOSE,) 2 MG/1.5ML SOPN 1.5 mL 0    Sig: Inject 0.5 mg into the skin once a week.     Endocrinology:  Diabetes - GLP-1 Receptor Agonists Failed - 08/30/2021  4:06 PM      Failed - HBA1C is between 0 and 7.9 and within 180 days    Hemoglobin A1C  Date Value Ref Range Status  08/07/2021 12.3 (A) 4.0 - 5.6 % Final   Hgb A1c MFr Bld  Date Value Ref Range Status  02/05/2020 10.9 (H) 4.8 - 5.6 % Final    Comment:             Prediabetes: 5.7 - 6.4          Diabetes: >6.4          Glycemic control for adults with diabetes: <7.0           Failed - Valid encounter within last 6 months    Recent Outpatient Visits           1 year ago Encounter for Papanicolaou smear for cervical cancer screening   [place], [place] W, NP   1 year ago Type 2 diabetes mellitus with diabetic polyneuropathy, with long-term current use of insulin [facility])   [place], [place] W, NP   1 year ago Type 2 diabetes mellitus with diabetic polyneuropathy, with long-term current use of insulin [facility])   [place], [place] W, NP   3 years ago Type 2 diabetes mellitus with hyperglycemia, without long-term current use of insulin [facility])   [place], Vernia Buff, NP   3 years ago Essential hypertension   [place], Vernia Buff, NP       Future Appointments             In 6 days Gildardo Pounds, NP [place]   In 2 months  Gildardo Pounds, NP [place]

## 2021-09-01 ENCOUNTER — Other Ambulatory Visit: Payer: Self-pay

## 2021-09-04 ENCOUNTER — Other Ambulatory Visit: Payer: Self-pay | Admitting: Nurse Practitioner

## 2021-09-04 ENCOUNTER — Encounter: Payer: Self-pay | Admitting: Nurse Practitioner

## 2021-09-04 MED ORDER — OZEMPIC (0.25 OR 0.5 MG/DOSE) 2 MG/1.5ML ~~LOC~~ SOPN
0.5000 mg | PEN_INJECTOR | SUBCUTANEOUS | 3 refills | Status: AC
  Filled 2021-09-04 – 2021-10-03 (×4): qty 1.5, 28d supply, fill #0

## 2021-09-05 ENCOUNTER — Other Ambulatory Visit: Payer: Self-pay

## 2021-09-05 ENCOUNTER — Telehealth: Payer: Self-pay | Admitting: Nurse Practitioner

## 2021-09-05 ENCOUNTER — Telehealth: Payer: Self-pay

## 2021-09-05 ENCOUNTER — Ambulatory Visit: Payer: Self-pay | Attending: Nurse Practitioner | Admitting: Nurse Practitioner

## 2021-09-05 NOTE — Telephone Encounter (Signed)
No answer. LVM to return call to front office [phone] regarding appointment today

## 2021-09-05 NOTE — Telephone Encounter (Signed)
No answer. LVM. Will attempt to reach out again to patient shortly.

## 2021-09-05 NOTE — Telephone Encounter (Signed)
PA for Ozempic approved until 09/05/22

## 2021-09-05 NOTE — Telephone Encounter (Signed)
Unable to reach. LVM to return call to office

## 2021-09-05 NOTE — Telephone Encounter (Signed)
No answer. LVM to return call to front office [phone] regarding appointment today

## 2021-09-12 ENCOUNTER — Other Ambulatory Visit: Payer: Self-pay

## 2021-09-15 ENCOUNTER — Encounter: Payer: Self-pay | Admitting: Nurse Practitioner

## 2021-09-25 ENCOUNTER — Other Ambulatory Visit: Payer: Self-pay | Admitting: Nurse Practitioner

## 2021-09-25 DIAGNOSIS — Z794 Long term (current) use of insulin: Secondary | ICD-10-CM

## 2021-09-25 DIAGNOSIS — E1142 Type 2 diabetes mellitus with diabetic polyneuropathy: Secondary | ICD-10-CM

## 2021-09-29 ENCOUNTER — Other Ambulatory Visit: Payer: Self-pay

## 2021-09-29 ENCOUNTER — Other Ambulatory Visit: Payer: Self-pay | Admitting: Nurse Practitioner

## 2021-09-29 DIAGNOSIS — E1142 Type 2 diabetes mellitus with diabetic polyneuropathy: Secondary | ICD-10-CM

## 2021-09-29 MED ORDER — INSULIN GLARGINE 100 UNIT/ML SOLOSTAR PEN
20.0000 [IU] | PEN_INJECTOR | Freq: Every day | SUBCUTANEOUS | 0 refills | Status: DC
  Filled 2021-09-29: qty 18, 90d supply, fill #0

## 2021-10-02 ENCOUNTER — Other Ambulatory Visit: Payer: Self-pay

## 2021-10-03 ENCOUNTER — Other Ambulatory Visit: Payer: Self-pay

## 2021-10-06 ENCOUNTER — Other Ambulatory Visit: Payer: Self-pay | Admitting: Nurse Practitioner

## 2021-10-06 MED ORDER — SULFAMETHOXAZOLE-TRIMETHOPRIM 800-160 MG PO TABS: 2.0000 | ORAL_TABLET | Freq: Two times a day (BID) | ORAL | 0 refills | Status: AC

## 2021-10-17 ENCOUNTER — Encounter: Payer: Self-pay | Admitting: Nurse Practitioner

## 2021-10-17 ENCOUNTER — Other Ambulatory Visit: Payer: Self-pay | Admitting: Nurse Practitioner

## 2021-10-17 ENCOUNTER — Other Ambulatory Visit: Payer: Self-pay

## 2021-10-17 MED ORDER — BUSPIRONE HCL 10 MG PO TABS
10.0000 mg | ORAL_TABLET | Freq: Two times a day (BID) | ORAL | 3 refills | Status: AC
  Filled 2021-10-17: qty 60, 30d supply, fill #0

## 2021-10-18 ENCOUNTER — Encounter (HOSPITAL_COMMUNITY): Payer: Self-pay

## 2021-10-18 ENCOUNTER — Emergency Department (HOSPITAL_COMMUNITY)
Admission: EM | Admit: 2021-10-18 | Discharge: 2021-10-19 | Disposition: A | Discharge status: Home / Self Care | Payer: 59 | Attending: Emergency Medicine | Admitting: Emergency Medicine

## 2021-10-18 ENCOUNTER — Encounter: Payer: Self-pay | Admitting: Nurse Practitioner

## 2021-10-18 DIAGNOSIS — R21 Rash and other nonspecific skin eruption: Secondary | ICD-10-CM | POA: Diagnosis present

## 2021-10-18 DIAGNOSIS — Z79899 Other long term (current) drug therapy: Secondary | ICD-10-CM | POA: Insufficient documentation

## 2021-10-18 DIAGNOSIS — J45909 Unspecified asthma, uncomplicated: Secondary | ICD-10-CM | POA: Insufficient documentation

## 2021-10-18 DIAGNOSIS — T7840XA Allergy, unspecified, initial encounter: Secondary | ICD-10-CM | POA: Insufficient documentation

## 2021-10-18 DIAGNOSIS — Z7984 Long term (current) use of oral hypoglycemic drugs: Secondary | ICD-10-CM | POA: Diagnosis not present

## 2021-10-18 DIAGNOSIS — Z87891 Personal history of nicotine dependence: Secondary | ICD-10-CM | POA: Diagnosis not present

## 2021-10-18 DIAGNOSIS — I10 Essential (primary) hypertension: Secondary | ICD-10-CM | POA: Diagnosis not present

## 2021-10-18 DIAGNOSIS — E119 Type 2 diabetes mellitus without complications: Secondary | ICD-10-CM | POA: Diagnosis not present

## 2021-10-18 DIAGNOSIS — Z794 Long term (current) use of insulin: Secondary | ICD-10-CM | POA: Diagnosis not present

## 2021-10-18 MED ORDER — FAMOTIDINE 20 MG PO TABS
20.0000 mg | ORAL_TABLET | Freq: Once | ORAL | Status: AC
  Administered 2021-10-19: 20 mg via ORAL
  Filled 2021-10-18: qty 1

## 2021-10-18 MED ORDER — DIPHENHYDRAMINE HCL 25 MG PO CAPS
25.0000 mg | ORAL_CAPSULE | Freq: Once | ORAL | Status: AC
  Administered 2021-10-19: 25 mg via ORAL
  Filled 2021-10-18: qty 1

## 2021-10-18 MED ORDER — DEXAMETHASONE 6 MG PO TABS
10.0000 mg | ORAL_TABLET | Freq: Once | ORAL | Status: AC
  Administered 2021-10-19: 10 mg via ORAL
  Filled 2021-10-18: qty 1

## 2021-10-18 NOTE — ED Provider Notes (Signed)
Emergency Medicine Provider Triage Evaluation Note  Wendy Olson , a 45 y.o. female  was evaluated in triage.  Pt complains of rash. Initially was sob but was given iv benadryl by ems and sob resolved upon arrival to the ed. Still c/o itchiness.  Review of Systems  Positive: Rash, sob (resolved) Negative: Facial swelling  Physical Exam  BP 112/75 (BP Location: Right Arm)   Pulse (!) 104   Temp 98.4 F (36.9 C) (Oral)   Resp 18   SpO2 100%  Gen:   Awake, no distress   Resp:  Normal effort  MSK:   Moves extremities without difficulty  Other:  Urticaria to the abdomen  Medical Decision Making  Medically screening exam initiated at 8:30 PM.  Appropriate orders placed.  Wendy Olson was informed that the remainder of the evaluation will be completed by another provider, this initial triage assessment does not replace that evaluation, and the importance of remaining in the ED until their evaluation is complete.     Bishop [place] 10/18/21 2031    Regan Lemming, MD 10/18/21 2136

## 2021-10-18 NOTE — ED Provider Notes (Signed)
[facility] EMERGENCY DEPARTMENT Provider Note   CSN: [id number] Arrival date & time: 10/18/21  1831     History Chief Complaint  Patient presents with   Rash    Wendy Olson is a 45 y.o. female.  Patient to ED for evaluation of generalized, itching rash that started last night. She reports she felt SOB initially but this resolved with Benadryl provided by EMS. She started an antibiotic on 11/15 for a skin sore but has had no problem until tonight. She reports the skin sore has been improving.   The history is provided by the patient. No language interpreter was used.  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     Family History  Problem Relation Age of Onset   Diabetes Mother    Hypertension Mother    Dementia Mother    Diabetes Father    Hypertension Father     Social History   Tobacco Use   Smoking status: Former    Types: Cigarettes    Quit date: 01/25/2007    Years since quitting: 14.7   Smokeless tobacco: Never  Vaping Use   Vaping Use: Never used  Substance Use Topics   Alcohol use: No   Drug use: No    Home Medications Prior to Admission medications   Medication Sig Start Date End Date Taking? Authorizing Provider  atorvastatin (LIPITOR) 20 MG tablet Take 1 tablet (20 mg total) by mouth daily. 04/12/20   Gildardo Pounds, NP  busPIRone (BUSPAR) 10 MG tablet Take 1  tablet (10 mg total) by mouth 2 (two) times daily. 10/17/21 11/16/21  Gildardo Pounds, NP  gabapentin (NEURONTIN) 300 MG capsule Take 1 capsule (300 mg total) by mouth 3 (three) times daily. 08/18/21   Gildardo Pounds, NP  glipiZIDE (GLUCOTROL) 5 MG tablet Take 1 tablet (5 mg total) by mouth daily before breakfast. 08/22/21 11/30/21  Gildardo Pounds, NP  ibuprofen (ADVIL) 800 MG tablet Take 1 tablet (800 mg total) by mouth every 8 (eight) hours as needed. 02/03/20   Gildardo Pounds, NP  insulin glargine (LANTUS) 100 UNIT/ML Solostar Pen Inject 20 Units into the skin daily at 10 pm. 09/29/21 01/01/22  Gildardo Pounds, NP  Insulin Pen Needle (B-D UF III MINI PEN NEEDLES) 31G X 5 MM MISC Use as instructed 08/07/21   Gildardo Pounds, NP  lisinopril (ZESTRIL) 20 MG tablet Take 1 tablet (20 mg total) by mouth daily. 08/24/21 11/30/21  Gildardo Pounds, NP    Allergies    Lobster [shellfish allergy] and Reglan [metoclopramide]  Review of Systems   Review of Systems  Constitutional:  Negative for chills and fever.  HENT: Negative.    Respiratory:  Positive for shortness of breath.   Cardiovascular: Negative.   Gastrointestinal:  Negative.   Musculoskeletal: Negative.   Skin:  Positive for rash.  Neurological: Negative.    Physical Exam Updated Vital Signs BP 112/75 (BP Location: Right Arm)   Pulse (!) 104   Temp 98.4 F (36.9 C) (Oral)   Resp 18   SpO2 100%   Physical Exam Vitals and nursing note reviewed.  Constitutional:      Appearance: She is well-developed.  HENT:     Head: Normocephalic.  Cardiovascular:     Rate and Rhythm: Normal rate and regular rhythm.     Heart sounds: No murmur heard. Pulmonary:     Effort: Pulmonary effort is normal.     Breath sounds: Normal breath sounds. No wheezing, rhonchi or rales.  Abdominal:     General: Bowel sounds are normal.     Palpations: Abdomen is soft.     Tenderness: There is no abdominal tenderness. There is no guarding or rebound.   Musculoskeletal:        General: Normal range of motion.     Cervical back: Normal range of motion and neck supple.  Skin:    General: Skin is warm and dry.     Comments: Urticarial rash in generalized distribution. There is a circular skin ulceration on the medial aspect of the right ankle. No purulence, blistering or drainage. There is an erythematous rash surrounding the ulceration.   Neurological:     General: No focal deficit present.     Mental Status: She is alert and oriented to person, place, and time.    ED Results / Procedures / Treatments   Labs (all labs ordered are listed, but only abnormal results are displayed) Labs Reviewed - No data to display  EKG None  Radiology No results found.  Procedures Procedures   Medications Ordered in ED Medications  dexamethasone (DECADRON) tablet 10 mg (has no administration in time range)  famotidine (PEPCID) tablet 20 mg (has no administration in time range)  diphenhydrAMINE (BENADRYL) capsule 25 mg (has no administration in time range)    ED Course  I have reviewed the triage vital signs and the nursing notes.  Pertinent labs & imaging results that were available during my care of the patient were reviewed by me and considered in my medical decision making (see chart for details).    MDM Rules/Calculators/A&P                           Patient to ED with urticarial rash since last night. Initially had SOB which has not recurred since Benadryl given by EMS on initial evaluation.   There are hives present. No wheezing, facial swelling. Skin ulceration is apparently healing. The rash surround the sore is urticarial as well. She is using topical Neosporin to the wound. She does not remember the oral abx she was given. No reference on chart review.   Will stop the oral and topical antibiotics. Will finish her 10 day course with doxycycline. Encourage recheck in the next week with her doctor.   Final Clinical Impression(s)  / ED Diagnoses Final diagnoses:  None   Allergic reaction  Rx / DC Orders ED Discharge Orders     None        Dennie Bible [birth year] 1856    Delora Fuel, MD 31/49/70 [phone]

## 2021-10-18 NOTE — ED Triage Notes (Signed)
Pt states she broke out in a rash 3-4 hours ago, PTA reports she given benadryl in her IV, does not know what caused the reaction, reports SOB earlier and none now

## 2021-10-19 MED ORDER — DOXYCYCLINE HYCLATE 100 MG PO CAPS: 100.0000 mg | ORAL_CAPSULE | Freq: Two times a day (BID) | ORAL | 0 refills | Status: AC

## 2021-10-19 NOTE — Discharge Instructions (Addendum)
Stop the antibiotics you are taking and finish your treatment with the new one called into [facility] (will be open over the holiday).   Follow up with your doctor as needed.   Continue Benadryl 25 mg every 4-6 hours, Pepcid 20 mg twice daily, both for the next 3 days, and then as needed if rash recurs.

## 2021-10-20 ENCOUNTER — Other Ambulatory Visit: Payer: Self-pay | Admitting: Nurse Practitioner

## 2021-10-20 DIAGNOSIS — E1142 Type 2 diabetes mellitus with diabetic polyneuropathy: Secondary | ICD-10-CM

## 2021-10-21 MED ORDER — GLIPIZIDE 5 MG PO TABS
5.0000 mg | ORAL_TABLET | Freq: Every day | ORAL | 0 refills | Status: DC
  Filled 2021-10-21 – 2021-10-26 (×2): qty 17, 17d supply, fill #0

## 2021-10-21 NOTE — Telephone Encounter (Signed)
Requested Prescriptions  Pending Prescriptions Disp Refills  . glipiZIDE (GLUCOTROL) 5 MG tablet 17 tablet 0    Sig: Take 1 tablet (5 mg total) by mouth daily before breakfast.     Endocrinology:  Diabetes - Sulfonylureas Failed - 10/20/2021 12:22 PM      Failed - HBA1C is between 0 and 7.9 and within 180 days    Hemoglobin A1C  Date Value Ref Range Status  08/07/2021 12.3 (A) 4.0 - 5.6 % Final   Hgb A1c MFr Bld  Date Value Ref Range Status  02/05/2020 10.9 (H) 4.8 - 5.6 % Final    Comment:             Prediabetes: 5.7 - 6.4          Diabetes: >6.4          Glycemic control for adults with diabetes: <7.0          Passed - Valid encounter within last 6 months    Recent Outpatient Visits          1 year ago Encounter for Papanicolaou smear for cervical cancer screening   [place], [place] W, NP   1 year ago Type 2 diabetes mellitus with diabetic polyneuropathy, with long-term current use of insulin [facility])   [place], [place] W, NP   1 year ago Type 2 diabetes mellitus with diabetic polyneuropathy, with long-term current use of insulin [facility])   [place], [place] W, NP   3 years ago Type 2 diabetes mellitus with hyperglycemia, without long-term current use of insulin [facility])   [place], Vernia Buff, NP   3 years ago Essential hypertension   [place], Vernia Buff, NP      Future Appointments            In 2 weeks Gildardo Pounds, NP [place]

## 2021-10-22 ENCOUNTER — Encounter: Payer: Self-pay | Admitting: Nurse Practitioner

## 2021-10-23 ENCOUNTER — Other Ambulatory Visit: Payer: Self-pay

## 2021-10-23 ENCOUNTER — Other Ambulatory Visit: Payer: Self-pay | Admitting: Nurse Practitioner

## 2021-10-23 DIAGNOSIS — Z1211 Encounter for screening for malignant neoplasm of colon: Secondary | ICD-10-CM

## 2021-10-24 ENCOUNTER — Other Ambulatory Visit: Payer: Self-pay

## 2021-10-24 ENCOUNTER — Telehealth: Payer: Self-pay | Admitting: Nurse Practitioner

## 2021-10-24 ENCOUNTER — Ambulatory Visit: Payer: 59 | Attending: Nurse Practitioner | Admitting: Nurse Practitioner

## 2021-10-24 NOTE — Telephone Encounter (Signed)
NO ANSWER LVM

## 2021-10-24 NOTE — Telephone Encounter (Signed)
NO answer LVM 

## 2021-10-25 ENCOUNTER — Ambulatory Visit: Payer: 59 | Admitting: Nurse Practitioner

## 2021-10-26 ENCOUNTER — Other Ambulatory Visit: Payer: Self-pay

## 2021-10-29 ENCOUNTER — Encounter: Payer: Self-pay | Admitting: Nurse Practitioner

## 2021-11-02 ENCOUNTER — Other Ambulatory Visit: Payer: Self-pay

## 2021-11-06 ENCOUNTER — Other Ambulatory Visit: Payer: Self-pay | Admitting: Nurse Practitioner

## 2021-11-06 ENCOUNTER — Other Ambulatory Visit: Payer: Self-pay

## 2021-11-06 ENCOUNTER — Ambulatory Visit: Payer: 59 | Admitting: Nurse Practitioner

## 2021-11-06 DIAGNOSIS — Z1159 Encounter for screening for other viral diseases: Secondary | ICD-10-CM

## 2021-11-06 DIAGNOSIS — Z1211 Encounter for screening for malignant neoplasm of colon: Secondary | ICD-10-CM

## 2021-11-06 DIAGNOSIS — E785 Hyperlipidemia, unspecified: Secondary | ICD-10-CM

## 2021-11-06 DIAGNOSIS — D72829 Elevated white blood cell count, unspecified: Secondary | ICD-10-CM

## 2021-11-06 DIAGNOSIS — E1165 Type 2 diabetes mellitus with hyperglycemia: Secondary | ICD-10-CM

## 2021-11-06 DIAGNOSIS — E1142 Type 2 diabetes mellitus with diabetic polyneuropathy: Secondary | ICD-10-CM

## 2021-11-06 MED ORDER — GLIPIZIDE 5 MG PO TABS
5.0000 mg | ORAL_TABLET | Freq: Every day | ORAL | 0 refills | Status: DC
  Filled 2021-11-06 (×2): qty 30, 30d supply, fill #0

## 2021-11-07 ENCOUNTER — Telehealth: Payer: Self-pay | Admitting: Nurse Practitioner

## 2021-11-07 NOTE — Telephone Encounter (Signed)
Copied from [place] [phone]. Topic: General - Inquiry >> Nov 06, 2021  9:40 AM Scherrie Gerlach wrote: Reason for CRM: pt got a 30 day refill of glipizide, however first available appt w/ zelda is 2/13.  Pt will need another 30 day Rx to get her through appt, but she will call when she needs

## 2021-11-13 NOTE — Telephone Encounter (Signed)
Left message to return call to our office.  Contacted pt to scheduled a sooner appt to get medication refilled.

## 2021-12-26 ENCOUNTER — Ambulatory Visit: Payer: 59 | Admitting: Critical Care Medicine

## 2021-12-28 ENCOUNTER — Other Ambulatory Visit: Payer: Self-pay | Admitting: Nurse Practitioner

## 2021-12-28 DIAGNOSIS — E1142 Type 2 diabetes mellitus with diabetic polyneuropathy: Secondary | ICD-10-CM

## 2021-12-28 NOTE — Telephone Encounter (Signed)
Requested medications are due for refill today.  yes  Requested medications are on the active medications list.  yes  Last refill. 11/06/2021  Future visit scheduled.   yes  Notes to clinic.  Prescription is expired 12/06/2021.    Requested Prescriptions  Pending Prescriptions Disp Refills   glipiZIDE (GLUCOTROL) 5 MG tablet 30 tablet 0    Sig: Take 1 tablet (5 mg total) by mouth daily before breakfast.     Endocrinology:  Diabetes - Sulfonylureas Failed - 12/28/2021 10:07 PM      Failed - HBA1C is between 0 and 7.9 and within 180 days    Hemoglobin A1C  Date Value Ref Range Status  08/07/2021 12.3 (A) 4.0 - 5.6 % Final   Hgb A1c MFr Bld  Date Value Ref Range Status  02/05/2020 10.9 (H) 4.8 - 5.6 % Final    Comment:             Prediabetes: 5.7 - 6.4          Diabetes: >6.4          Glycemic control for adults with diabetes: <7.0           Passed - Cr in normal range and within 360 days    Creatinine, Ser  Date Value Ref Range Status  08/02/2021 0.70 0.44 - 1.00 mg/dL Final   Creatinine, POC  Date Value Ref Range Status  12/12/2017 200mg /dl mg/dL Final          Passed - Valid encounter within last 6 months    Recent Outpatient Visits           1 year ago Encounter for Papanicolaou smear for cervical cancer screening   [place], [place] W, NP   1 year ago Type 2 diabetes mellitus with diabetic polyneuropathy, with long-term current use of insulin ([place])   [place], [place] W, NP   1 year ago Type 2 diabetes mellitus with diabetic polyneuropathy, with long-term current use of insulin [facility])   [place], [place] W, NP   3 years ago Type 2 diabetes mellitus with hyperglycemia, without long-term current use of insulin [facility])   [place], Vernia Buff, NP   3 years ago Essential hypertension   [place], Vernia Buff, NP       Future Appointments             In 1 week Gildardo Pounds, NP [place]

## 2021-12-29 ENCOUNTER — Other Ambulatory Visit (HOSPITAL_COMMUNITY): Payer: Self-pay

## 2021-12-29 MED ORDER — GLIPIZIDE 5 MG PO TABS
5.0000 mg | ORAL_TABLET | Freq: Every day | ORAL | 0 refills | Status: DC
  Filled 2021-12-29: qty 30, 30d supply, fill #0

## 2022-01-02 ENCOUNTER — Other Ambulatory Visit (HOSPITAL_COMMUNITY): Payer: Self-pay

## 2022-01-04 ENCOUNTER — Other Ambulatory Visit (HOSPITAL_COMMUNITY): Payer: Self-pay

## 2022-01-05 ENCOUNTER — Other Ambulatory Visit (HOSPITAL_COMMUNITY): Payer: Self-pay

## 2022-01-05 ENCOUNTER — Other Ambulatory Visit: Payer: Self-pay

## 2022-01-08 ENCOUNTER — Other Ambulatory Visit: Payer: Self-pay | Admitting: Pharmacist

## 2022-01-08 ENCOUNTER — Encounter: Payer: Self-pay | Admitting: Nurse Practitioner

## 2022-01-08 ENCOUNTER — Other Ambulatory Visit: Payer: Self-pay

## 2022-01-08 ENCOUNTER — Ambulatory Visit: Payer: 59 | Attending: Critical Care Medicine | Admitting: Nurse Practitioner

## 2022-01-08 VITALS — BP 143/91 | HR 75 | Ht 67.0 in | Wt 219.0 lb

## 2022-01-08 DIAGNOSIS — E1165 Type 2 diabetes mellitus with hyperglycemia: Secondary | ICD-10-CM | POA: Diagnosis not present

## 2022-01-08 DIAGNOSIS — I10 Essential (primary) hypertension: Secondary | ICD-10-CM | POA: Diagnosis not present

## 2022-01-08 DIAGNOSIS — E782 Mixed hyperlipidemia: Secondary | ICD-10-CM

## 2022-01-08 DIAGNOSIS — Z794 Long term (current) use of insulin: Secondary | ICD-10-CM

## 2022-01-08 DIAGNOSIS — E785 Hyperlipidemia, unspecified: Secondary | ICD-10-CM | POA: Diagnosis not present

## 2022-01-08 DIAGNOSIS — Z1231 Encounter for screening mammogram for malignant neoplasm of breast: Secondary | ICD-10-CM

## 2022-01-08 DIAGNOSIS — Z1159 Encounter for screening for other viral diseases: Secondary | ICD-10-CM

## 2022-01-08 DIAGNOSIS — Z1211 Encounter for screening for malignant neoplasm of colon: Secondary | ICD-10-CM

## 2022-01-08 DIAGNOSIS — D72829 Elevated white blood cell count, unspecified: Secondary | ICD-10-CM

## 2022-01-08 DIAGNOSIS — E1142 Type 2 diabetes mellitus with diabetic polyneuropathy: Secondary | ICD-10-CM

## 2022-01-08 LAB — GLUCOSE, POCT (MANUAL RESULT ENTRY): POC Glucose: 198 mg/dl — AB (ref 70–99)

## 2022-01-08 LAB — POCT GLYCOSYLATED HEMOGLOBIN (HGB A1C): Hemoglobin A1C: 9.2 % — AB (ref 4.0–5.6)

## 2022-01-08 MED ORDER — ATORVASTATIN CALCIUM 20 MG PO TABS
20.0000 mg | ORAL_TABLET | Freq: Every day | ORAL | 3 refills | Status: DC
  Filled 2022-01-08: qty 90, 90d supply, fill #0

## 2022-01-08 MED ORDER — INSULIN GLARGINE-YFGN 100 UNIT/ML ~~LOC~~ SOPN
20.0000 [IU] | PEN_INJECTOR | Freq: Every day | SUBCUTANEOUS | 2 refills | Status: DC
  Filled 2022-01-08: qty 6, 30d supply, fill #0
  Filled 2022-09-21: qty 6, 30d supply, fill #1

## 2022-01-08 MED ORDER — GABAPENTIN 300 MG PO CAPS
300.0000 mg | ORAL_CAPSULE | Freq: Three times a day (TID) | ORAL | 3 refills | Status: DC
  Filled 2022-09-21 (×2): qty 90, 30d supply, fill #0

## 2022-01-08 MED ORDER — TRULICITY 0.75 MG/0.5ML ~~LOC~~ SOAJ
0.7500 mg | SUBCUTANEOUS | 1 refills | Status: AC
  Filled 2022-01-08: qty 2, 28d supply, fill #0
  Filled 2022-02-05: qty 2, 28d supply, fill #1

## 2022-01-08 MED ORDER — BD PEN NEEDLE MINI U/F 31G X 5 MM MISC
6 refills | Status: DC
  Filled 2022-01-08: qty 200, 90d supply, fill #0
  Filled 2022-09-21: qty 100, 25d supply, fill #0
  Filled 2022-09-21: qty 200, 90d supply, fill #0
  Filled 2023-01-07: qty 100, 100d supply, fill #0

## 2022-01-08 MED ORDER — GLIPIZIDE 5 MG PO TABS
5.0000 mg | ORAL_TABLET | Freq: Every day | ORAL | 2 refills | Status: DC
Start: 2022-01-08 — End: 2023-02-02
  Filled 2022-01-08 – 2022-01-26 (×2): qty 90, 90d supply, fill #0
  Filled 2022-09-21 – 2023-01-07 (×4): qty 90, 90d supply, fill #1

## 2022-01-08 MED ORDER — LISINOPRIL 20 MG PO TABS
20.0000 mg | ORAL_TABLET | Freq: Every day | ORAL | 2 refills | Status: DC
  Filled 2022-01-08: qty 90, 90d supply, fill #0
  Filled 2022-09-21 (×3): qty 90, 90d supply, fill #1

## 2022-01-08 MED ORDER — INSULIN GLARGINE 100 UNIT/ML SOLOSTAR PEN
20.0000 [IU] | PEN_INJECTOR | Freq: Every day | SUBCUTANEOUS | 0 refills | Status: DC
  Filled 2022-01-08: qty 18, 90d supply, fill #0

## 2022-01-08 NOTE — Progress Notes (Signed)
Assessment & Plan:  Wendy Olson was seen today for diabetes.  Diagnoses and all orders for this visit:  Type 2 diabetes mellitus with hyperglycemia, with long-term current use of insulin (HCC) -     POCT glycosylated hemoglobin (Hb A1C) -     POCT glucose (manual entry) -     atorvastatin (LIPITOR) 20 MG tablet; Take 1 tablet (20 mg total) by mouth daily. -     glipiZIDE (GLUCOTROL) 5 MG tablet; Take 1 tablet by mouth daily before breakfast. -     Insulin Pen Needle (B-D UF III MINI PEN NEEDLES) 31G X 5 MM MISC; Use as instructed -     Dulaglutide (TRULICITY) 1.27 NT/7.0YF SOPN; Inject 0.75 mg into the skin once a week. -     CMP14+EGFR  Type 2 diabetes mellitus with diabetic polyneuropathy, with long-term current use of insulin (HCC) -     POCT glucose (manual entry) -     gabapentin (NEURONTIN) 300 MG capsule; Take 1 capsule (300 mg total) by mouth 3 (three) times daily. -     Discontinue: insulin glargine (LANTUS) 100 UNIT/ML Solostar Pen; Inject 20 Units into the skin daily at 10 pm.  Essential hypertension -     lisinopril (ZESTRIL) 20 MG tablet; Take 1 tablet (20 mg total) by mouth daily. -     CMP14+EGFR  Colon cancer screening -     Ambulatory referral to Gastroenterology  Breast cancer screening by mammogram -     atorvastatin (LIPITOR) 20 MG tablet; Take 1 tablet (20 mg total) by mouth daily. -     MM 3D SCREEN BREAST BILATERAL; Future  Need for hepatitis C screening test -     HCV Ab w Reflex to Quant PCR -     Interpretation:  Dyslipidemia, goal LDL below 70 -     atorvastatin (LIPITOR) 20 MG tablet; Take 1 tablet (20 mg total) by mouth daily. -     Lipid panel  Leukocytosis, unspecified type -     CBC with Differential    Patient has been counseled on age-appropriate routine health concerns for screening and prevention. These are reviewed and up-to-date. Referrals have been placed accordingly. Immunizations are up-to-date or declined.    Subjective:   Chief  Complaint  Patient presents with   Diabetes   Wendy Olson 46 y.o. 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    HTN Blood pressure is not at goal. Needs to work on weight loss and improve dietary intake. Will need to increase lisinopril if continues elevated.  BP Readings from Last 3 Encounters:  01/08/22 (!) 143/91  10/18/21 113/86  08/07/21 (!) 146/91    DM 2 Improved but A1c should be less than 7 by now. Will add Trulicity 7.49 mg weekly today. She will continue on glipizide 5 mg daily and semglee 20 units daily. Could not tolerate metformin due to GI upset. Hyperglycemic symptoms include peripheral neuropathy for which she is taking gabapentin. LDL not at goal. She endorses adherence taking atorvastatin 20 mg daily.  Lab Results  Component Value Date   HGBA1C 9.2 (A) 01/08/2022   Lab Results  Component Value Date   HGBA1C 12.3 (A) 08/07/2021   Lab Results  Component Value Date   LDLCALC 231 (H) 01/08/2022     Review of Systems  Constitutional:  Negative for fever, malaise/fatigue and weight loss.  HENT: Negative.  Negative for nosebleeds.   Eyes: Negative.  Negative for blurred vision, double vision and photophobia.  Respiratory: Negative.  Negative for cough and shortness of breath.   Cardiovascular: Negative.  Negative for chest pain, palpitations and leg swelling.  Gastrointestinal: Negative.  Negative for heartburn, nausea and vomiting.  Musculoskeletal: Negative.  Negative for myalgias.  Neurological: Negative.  Negative for dizziness, focal weakness, seizures and headaches.  Psychiatric/Behavioral: Negative.  Negative for suicidal ideas.    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   Outpatient Medications Prior to Visit  Medication Sig Dispense Refill   atorvastatin (LIPITOR) 20 MG tablet Take 1 tablet (20 mg total) by mouth daily. 90 tablet 3   gabapentin (NEURONTIN) 300 MG capsule Take 1 capsule (300 mg total) by mouth 3 (three) times daily. 90 capsule 3   glipiZIDE (GLUCOTROL) 5 MG tablet Take 1 tablet by mouth daily before breakfast. 30 tablet 0   Insulin Pen Needle (B-D UF III MINI PEN NEEDLES) 31G X 5 MM MISC Use as instructed 200 each 6   ibuprofen (ADVIL) 800 MG tablet Take 1 tablet (800 mg total) by mouth every 8 (eight) hours as needed. 60 tablet 1   insulin glargine (LANTUS) 100 UNIT/ML Solostar Pen Inject 20 Units into the skin daily at 10 pm. 18 mL 0   lisinopril (ZESTRIL) 20 MG tablet Take 1 tablet (20 mg total) by mouth daily. 90 tablet 2   No facility-administered medications prior to visit.    Allergies  Allergen Reactions   Lobster [Shellfish Allergy] Anaphylaxis    Just lobster    Reglan [Metoclopramide]     akasthesia       Objective:    BP (!) 143/91    Pulse 75    Ht _0  (1.702 m)    Wt 219 lb (99.3 kg)    LMP  (LMP Unknown)    SpO2 99%    BMI 34.30 kg/m  Wt Readings from Last 3 Encounters:  01/08/22 219 lb (99.3 kg)  08/07/21 222 lb (100.7 kg)  08/02/21 221 lb (100.2 kg)    Physical Exam Vitals and nursing note reviewed.  Constitutional:      Appearance: She is well-developed.  HENT:     Head: Normocephalic and atraumatic.  Cardiovascular:     Rate and Rhythm: Normal rate and regular rhythm.     Heart sounds: Normal heart sounds. No murmur heard.   No friction rub. No gallop.  Pulmonary:     Effort:  Pulmonary effort is normal. No tachypnea or respiratory distress.     Breath sounds: Normal breath sounds. No decreased breath sounds, wheezing, rhonchi or rales.  Chest:     Chest wall: No tenderness.  Abdominal:     General: Bowel sounds are normal.     Palpations: Abdomen is soft.  Musculoskeletal:  General: Normal range of motion.     Cervical back: Normal range of motion.  Skin:    General: Skin is warm and dry.  Neurological:     Mental Status: She is alert and oriented to person, place, and time.     Coordination: Coordination normal.  Psychiatric:        Behavior: Behavior normal. Behavior is cooperative.        Thought Content: Thought content normal.        Judgment: Judgment normal.         Patient has been counseled extensively about nutrition and exercise as well as the importance of adherence with medications and regular follow-up. The patient was given clear instructions to go to ER or return to medical center if symptoms don't improve, worsen or new problems develop. The patient verbalized understanding.   Follow-up: Return for luke 4 weeks meter check. See me in 3 months.   Gildardo Pounds, FNP-BC [facility] [place], [place]   01/10/2022, 9:41 PM

## 2022-01-09 LAB — CMP14+EGFR
ALT: 38 IU/L — ABNORMAL HIGH (ref 0–32)
AST: 27 IU/L (ref 0–40)
Albumin/Globulin Ratio: 1.8 (ref 1.2–2.2)
Albumin: 4.5 g/dL (ref 3.8–4.8)
Alkaline Phosphatase: 209 IU/L — ABNORMAL HIGH (ref 44–121)
BUN/Creatinine Ratio: 16 (ref 9–23)
BUN: 10 mg/dL (ref 6–24)
Bilirubin Total: 0.2 mg/dL (ref 0.0–1.2)
CO2: 21 mmol/L (ref 20–29)
Calcium: 9.5 mg/dL (ref 8.7–10.2)
Chloride: 107 mmol/L — ABNORMAL HIGH (ref 96–106)
Creatinine, Ser: 0.62 mg/dL (ref 0.57–1.00)
Globulin, Total: 2.5 g/dL (ref 1.5–4.5)
Glucose: 166 mg/dL — ABNORMAL HIGH (ref 70–99)
Potassium: 4.2 mmol/L (ref 3.5–5.2)
Sodium: 142 mmol/L (ref 134–144)
Total Protein: 7 g/dL (ref 6.0–8.5)
eGFR: 112 mL/min/{1.73_m2} (ref >59)

## 2022-01-09 LAB — CBC WITH DIFFERENTIAL/PLATELET
Basophils Absolute: 0 10*3/uL (ref 0.0–0.2)
Basos: 1 %
EOS (ABSOLUTE): 0.1 10*3/uL (ref 0.0–0.4)
Eos: 2 %
Hematocrit: 39.9 % (ref 34.0–46.6)
Hemoglobin: 13.7 g/dL (ref 11.1–15.9)
Immature Grans (Abs): 0 10*3/uL (ref 0.0–0.1)
Immature Granulocytes: 0 %
Lymphocytes Absolute: 1.9 10*3/uL (ref 0.7–3.1)
Lymphs: 32 %
MCH: 30.6 pg (ref 26.6–33.0)
MCHC: 34.3 g/dL (ref 31.5–35.7)
MCV: 89 fL (ref 79–97)
Monocytes Absolute: 0.4 10*3/uL (ref 0.1–0.9)
Monocytes: 7 %
Neutrophils Absolute: 3.4 10*3/uL (ref 1.4–7.0)
Neutrophils: 58 %
Platelets: 299 10*3/uL (ref 150–450)
RBC: 4.47 x10E6/uL (ref 3.77–5.28)
RDW: 12.8 % (ref 11.7–15.4)
WBC: 5.9 10*3/uL (ref 3.4–10.8)

## 2022-01-09 LAB — HCV AB W REFLEX TO QUANT PCR: HCV Ab: NONREACTIVE

## 2022-01-09 LAB — LIPID PANEL
Chol/HDL Ratio: 5.7 ratio — ABNORMAL HIGH (ref 0.0–4.4)
Cholesterol, Total: 306 mg/dL — ABNORMAL HIGH (ref 100–199)
HDL: 54 mg/dL (ref >39)
LDL Chol Calc (NIH): 231 mg/dL — ABNORMAL HIGH (ref 0–99)
Triglycerides: 118 mg/dL (ref 0–149)
VLDL Cholesterol Cal: 21 mg/dL (ref 5–40)

## 2022-01-09 LAB — HCV INTERPRETATION

## 2022-01-10 ENCOUNTER — Encounter: Payer: Self-pay | Admitting: Nurse Practitioner

## 2022-01-10 MED ORDER — ATORVASTATIN CALCIUM 40 MG PO TABS: 40.0000 mg | ORAL_TABLET | Freq: Every day | ORAL | 1 refills | Status: DC

## 2022-01-11 ENCOUNTER — Other Ambulatory Visit: Payer: Self-pay

## 2022-01-11 ENCOUNTER — Ambulatory Visit (AMBULATORY_SURGERY_CENTER): Payer: 59 | Admitting: *Deleted

## 2022-01-11 ENCOUNTER — Encounter: Payer: Self-pay | Admitting: Nurse Practitioner

## 2022-01-11 VITALS — Ht 67.0 in | Wt 219.0 lb

## 2022-01-11 DIAGNOSIS — Z1211 Encounter for screening for malignant neoplasm of colon: Secondary | ICD-10-CM

## 2022-01-11 MED ORDER — BISACODYL EC 5 MG PO TBEC
5.0000 mg | DELAYED_RELEASE_TABLET | Freq: Once | ORAL | 0 refills | Status: AC
  Filled 2022-01-11: qty 4, 4d supply, fill #0

## 2022-01-11 MED ORDER — PEG 3350-KCL-NA BICARB-NACL 420 G PO SOLR
4000.0000 mL | Freq: Once | ORAL | 0 refills | Status: AC
  Filled 2022-01-11: qty 4000, 1d supply, fill #0

## 2022-01-11 NOTE — Progress Notes (Signed)
No egg or soy allergy known to patient  No issues known to pt with past sedation with any surgeries or procedures Patient denies ever being told they had issues or difficulty with intubation  No FH of Malignant Hyperthermia Pt is not on diet pills Pt is not on  home 02  Pt is not on blood thinners  Pt states  issues with constipation occasionally and alternates with diarrhea  No A fib or A flutter  Pt is fully vaccinated  for Covid   Discussed prep options with pt for Friday health- she decided on [facility]   Due to the COVID-19 pandemic we are asking patients to follow certain guidelines in PV and the [facility]   Pt aware of COVID protocols and [facility] guidelines   PV completed over the phone. Pt verified name, DOB, address and insurance during PV today.  Pt mailed instruction packet with copy of consent form to read and not return, and instructions.  Pt encouraged to call with questions or issues.  If pt has My chart, procedure instructions sent via My Chart

## 2022-01-12 ENCOUNTER — Other Ambulatory Visit: Payer: Self-pay

## 2022-01-15 ENCOUNTER — Telehealth: Payer: Self-pay

## 2022-01-15 NOTE — Telephone Encounter (Signed)
Called patient reviewed all information and repeated back to me. Will call if any questions.  Has a colonoscopy appt for March 3.

## 2022-01-18 ENCOUNTER — Telehealth: Payer: Self-pay | Admitting: Internal Medicine

## 2022-01-18 DIAGNOSIS — Z1211 Encounter for screening for malignant neoplasm of colon: Secondary | ICD-10-CM

## 2022-01-18 MED ORDER — BISACODYL EC 5 MG PO TBEC: 5.0000 mg | DELAYED_RELEASE_TABLET | Freq: Once | ORAL | 0 refills | Status: AC

## 2022-01-18 MED ORDER — PEG 3350-KCL-NA BICARB-NACL 420 G PO SOLR: 4000.0000 mL | Freq: Once | ORAL | 0 refills | Status: AC

## 2022-01-18 NOTE — Telephone Encounter (Signed)
Rx sent to [facility]. There is no [facility] on [address]. Pt aware.

## 2022-01-18 NOTE — Telephone Encounter (Signed)
Spoke with patient.  She needs her prep sent to [facility].  Sent message to pre-visit nurse

## 2022-01-19 ENCOUNTER — Other Ambulatory Visit: Payer: Self-pay

## 2022-01-25 ENCOUNTER — Encounter: Payer: Self-pay | Admitting: Internal Medicine

## 2022-01-25 ENCOUNTER — Ambulatory Visit (AMBULATORY_SURGERY_CENTER): Payer: 59 | Admitting: Internal Medicine

## 2022-01-25 ENCOUNTER — Other Ambulatory Visit: Payer: Self-pay

## 2022-01-25 VITALS — BP 129/72 | HR 77 | Temp 97.8°F | Resp 16 | Ht 67.0 in | Wt 219.0 lb

## 2022-01-25 DIAGNOSIS — K635 Polyp of colon: Secondary | ICD-10-CM

## 2022-01-25 DIAGNOSIS — Z1211 Encounter for screening for malignant neoplasm of colon: Secondary | ICD-10-CM | POA: Diagnosis not present

## 2022-01-25 DIAGNOSIS — D123 Benign neoplasm of transverse colon: Secondary | ICD-10-CM

## 2022-01-25 MED ORDER — SODIUM CHLORIDE 0.9 % IV SOLN: 500.0000 mL | Freq: Once | INTRAVENOUS | Status: DC

## 2022-01-25 NOTE — Op Note (Signed)
[place] ?Patient Name: Wendy Olson ?Procedure Date: 01/25/2022 10:12 AM ?MRN: [id number] ?Endoscopist: Sonny Masters "Wendy Olson ,  ?Age: 46 ?Referring MD:  ?Date of Birth: [birth year] ?Gender: Female ?Account #: [id number] ?Procedure:                Colonoscopy ?Indications:              Screening for colorectal malignant neoplasm, This  ?                          is the patient's first colonoscopy ?Medicines:                Monitored Anesthesia Care ?Procedure:                Pre-Anesthesia Assessment: ?                          - Prior to the procedure, a History and Physical  ?                          was performed, and patient medications and  ?                          allergies were reviewed. The patient's tolerance of  ?                          previous anesthesia was also reviewed. The risks  ?                          and benefits of the procedure and the sedation  ?                          options and risks were discussed with the patient.  ?                          All questions were answered, and informed consent  ?                          was obtained. Prior Anticoagulants: The patient has  ?                          taken no previous anticoagulant or antiplatelet  ?                          agents. ASA Grade Assessment: II - A patient with  ?                          mild systemic disease. After reviewing the risks  ?                          and benefits, the patient was deemed in  ?                          satisfactory condition to undergo the procedure. ?  After obtaining informed consent, the colonoscope  ?                          was passed under direct vision. Throughout the  ?                          procedure, the patient's blood pressure, pulse, and  ?                          oxygen saturations were monitored continuously. The  ?                          CF [id number] #[id number] was introduced through the anus  ?                          and advanced to  the the terminal ileum. The  ?                          colonoscopy was performed without difficulty. The  ?                          patient tolerated the procedure well. The quality  ?                          of the bowel preparation was good. The terminal  ?                          ileum, ileocecal valve, appendiceal orifice, and  ?                          rectum were photographed. ?Scope In: 10:17:25 AM ?Scope Out: 10:35:50 AM ?Scope Withdrawal Time: 0 hours 14 minutes 12 seconds  ?Total Procedure Duration: 0 hours 18 minutes 25 seconds  ?Findings:                 A 5 mm polyp was found in the transverse colon. The  ?                          polyp was sessile. The polyp was removed with a  ?                          cold snare. Resection and retrieval were complete. ?                          The terminal ileum appeared normal. ?                          A single localized angioectasia without bleeding  ?                          was found in the transverse colon. ?                          Non-bleeding internal hemorrhoids were found during  ?  retroflexion. ?Complications:            No immediate complications. ?Estimated Blood Loss:     Estimated blood loss was minimal. ?Impression:               - The examined portion of the ileum was normal. ?                          - One 5 mm polyp in the transverse colon, removed  ?                          with a cold snare. Resected and retrieved. ?                          - A single non-bleeding colonic angioectasia. ?                          - Non-bleeding internal hemorrhoids. ?Recommendation:           - Discharge patient to home (with escort). ?                          - Await pathology results. ?                          - The findings and recommendations were discussed  ?                          with the patient. ?Wendy Olson,  ?01/25/2022 10:40:14 AM ?

## 2022-01-25 NOTE — Progress Notes (Signed)
? ?GASTROENTEROLOGY PROCEDURE H&P NOTE  ? ?Primary Care Physician: ?Gildardo Pounds, NP ? ? ? ?Reason for Procedure:   Colon cancer screening ? ?Plan:    Colonoscopy ? ?Patient is appropriate for endoscopic procedure(s) in the ambulatory ([place]) setting. ? ?The nature of the procedure, as well as the risks, benefits, and alternatives were carefully and thoroughly reviewed with the patient. Ample time for discussion and questions allowed. The patient understood, was satisfied, and agreed to proceed.  ? ? ? ?HPI: ?Wendy Olson is a 46 y.o. female who presents for colonoscopy for colon cancer screening. Denies fam hx of GI cancers. Denies blood in stools, weight loss, changes in bowel habits. ? ?Past Medical History:  ?Diagnosis Date  ? Allergy   ? seasonal  ? 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 Anxiety   ? Asthma   ? Diabetes mellitus without complication ([place])   ? type 2  ? Hyperlipidemia   ? on meds  ? Hypertension   ? on meds  ? Seasonal allergies   ? ? ?Past Surgical History:  ?Procedure Laterality Date  ? TUBAL LIGATION  2009  ? ? ?Prior to Admission medications   ?Medication Sig Start Date End Date Taking? Authorizing Provider  ?atorvastatin (LIPITOR) 40 MG tablet Take 1 tablet (40 mg total) by mouth daily. 01/10/22 04/10/22  Gildardo Pounds, NP  ?Dulaglutide (TRULICITY) 1.44 RX/5.4MG SOPN Inject 0.75 mg into the skin once a week. 01/08/22 02/07/22  Gildardo Pounds, NP  ?gabapentin (NEURONTIN) 300 MG capsule Take 1 capsule (300 mg total) by mouth 3 (three) times daily. 01/08/22   Gildardo Pounds, NP  ?glipiZIDE (GLUCOTROL) 5 MG tablet Take 1 tablet by mouth daily before breakfast. 01/08/22 04/08/22  Gildardo Pounds, NP  ?insulin glargine-yfgn (SEMGLEE, YFGN,) 100 UNIT/ML Pen Inject 20 Units into the skin daily. ?Patient taking differently: Inject 20 Units into the skin at bedtime. 01/08/22   Gildardo Pounds, NP  ?Insulin Pen Needle (B-D UF III MINI PEN NEEDLES) 31G X 5 MM MISC Use as instructed 01/08/22   Gildardo Pounds,  NP  ?lisinopril (ZESTRIL) 20 MG tablet Take 1 tablet (20 mg total) by mouth daily. 01/08/22 04/08/22  Gildardo Pounds, NP  ? ? ?Current Outpatient Medications  ?Medication Sig Dispense Refill  ? atorvastatin (LIPITOR) 40 MG tablet Take 1 tablet (40 mg total) by mouth daily. 90 tablet 1  ? Dulaglutide (TRULICITY) 8.67 YP/9.5KD SOPN Inject 0.75 mg into the skin once a week. 2 mL 1  ? gabapentin (NEURONTIN) 300 MG capsule Take 1 capsule (300 mg total) by mouth 3 (three) times daily. 90 capsule 3  ? glipiZIDE (GLUCOTROL) 5 MG tablet Take 1 tablet by mouth daily before breakfast. 90 tablet 2  ? insulin glargine-yfgn (SEMGLEE, YFGN,) 100 UNIT/ML Pen Inject 20 Units into the skin daily. (Patient taking differently: Inject 20 Units into the skin at bedtime.) 15 mL 2  ? Insulin Pen Needle (B-D UF III MINI PEN NEEDLES) 31G X 5 MM MISC Use as instructed 200 each 6  ? lisinopril (ZESTRIL) 20 MG tablet Take 1 tablet (20 mg total) by mouth daily. 90 tablet 2  ? ?Current Facility-Administered Medications  ?Medication Dose Route Frequency Provider Last Rate Last Admin  ? 0.9 %  sodium chloride infusion  500 mL Intravenous Once Sharyn Creamer, MD      ? ? ?Allergies as of 01/25/2022 - Review Complete 01/25/2022  ?Allergen Reaction Noted  ? Lobster [shellfish allergy]  Anaphylaxis 10/26/2015  ? Reglan [metoclopramide]  04/07/2018  ? ? ?Family History  ?Problem Relation Age of Onset  ? Diabetes Mother   ? Hypertension Mother   ? Dementia Mother   ? Pancreatic cancer Father   ? Diabetes Father   ? Hypertension Father   ? Colon cancer Neg Hx   ? Colon polyps Neg Hx   ? Esophageal cancer Neg Hx   ? Rectal cancer Neg Hx   ? Stomach cancer Neg Hx   ? ? ?Social History  ? ?Socioeconomic History  ? Marital status: Single  ?  Spouse name: Not on file  ? Number of children: Not on file  ? Years of education: Not on file  ? Highest education level: Not on file  ?Occupational History  ? Not on file  ?Tobacco Use  ? Smoking status: Former  ?   Types: Cigarettes  ?  Quit date: 01/25/2007  ?  Years since quitting: 15.0  ? Smokeless tobacco: Never  ?Vaping Use  ? Vaping Use: Never used  ?Substance and Sexual Activity  ? Alcohol use: No  ? Drug use: No  ? Sexual activity: Yes  ?  Birth control/protection: Surgical  ?  Comment: tubal ligation  ?Other Topics Concern  ? Not on file  ?Social History Narrative  ? Not on file  ? ?Social Determinants of Health  ? ?Financial Resource Strain: Not on file  ?Food Insecurity: Not on file  ?Transportation Needs: Not on file  ?Physical Activity: Not on file  ?Stress: Not on file  ?Social Connections: Not on file  ?Intimate Partner Violence: Not on file  ? ? ?Physical Exam: ?Vital signs in last 24 hours: ?BP (!) 160/93   Pulse 94   Temp 97.8 ?F (36.6 ?C) (Temporal)   Resp 15   Ht 5\' 7"  (1.702 m)   Wt 219 lb (99.3 kg)   LMP  (LMP Unknown)   SpO2 99%   BMI 34.30 kg/m?  ?GEN: NAD ?EYE: Sclerae anicteric ?ENT: MMM ?CV: Non-tachycardic ?Pulm: No increased work of breathing ?GI: Soft, NT/ND ?NEURO:  Alert & Oriented ? ? ?[name], MD ?[facility] ? ?01/25/2022 10:12 AM ? ?

## 2022-01-25 NOTE — Progress Notes (Signed)
To Pacu, VSS. Report to [email or web address] 

## 2022-01-25 NOTE — Patient Instructions (Signed)
Handout on polyps and hemorrhoids provided  ? ?Await pathology results.  ? ?Continue current medications.  ? ?YOU HAD AN ENDOSCOPIC PROCEDURE TODAY AT [facility]:   Refer to the procedure report that was given to you for any specific questions about what was found during the examination.  If the procedure report does not answer your questions, please call your gastroenterologist to clarify.  If you requested that your care partner not be given the details of your procedure findings, then the procedure report has been included in a sealed envelope for you to review at your convenience later. ? ?YOU SHOULD EXPECT: Some feelings of bloating in the abdomen. Passage of more gas than usual.  Walking can help get rid of the air that was put into your GI tract during the procedure and reduce the bloating. If you had a lower endoscopy (such as a colonoscopy or flexible sigmoidoscopy) you may notice spotting of blood in your stool or on the toilet paper. If you underwent a bowel prep for your procedure, you may not have a normal bowel movement for a few days. ? ?Please Note:  You might notice some irritation and congestion in your nose or some drainage.  This is from the oxygen used during your procedure.  There is no need for concern and it should clear up in a day or so. ? ?SYMPTOMS TO REPORT IMMEDIATELY: ? ?Following lower endoscopy (colonoscopy or flexible sigmoidoscopy): ? Excessive amounts of blood in the stool ? Significant tenderness or worsening of abdominal pains ? Swelling of the abdomen that is new, acute ? Fever of 100?F or higher ? ?For urgent or emergent issues, a gastroenterologist can be reached at any hour by calling [phone]. ?Do not use MyChart messaging for urgent concerns.  ? ? ?DIET:  We do recommend a small meal at first, but then you may proceed to your regular diet.  Drink plenty of fluids but you should avoid alcoholic beverages for 24 hours. ? ?ACTIVITY:  You should plan to  take it easy for the rest of today and you should NOT DRIVE or use heavy machinery until tomorrow (because of the sedation medicines used during the test).   ? ?FOLLOW UP: ?Our staff will call the number listed on your records 48-72 hours following your procedure to check on you and address any questions or concerns that you may have regarding the information given to you following your procedure. If we do not reach you, we will leave a message.  We will attempt to reach you two times.  During this call, we will ask if you have developed any symptoms of COVID 19. If you develop any symptoms (ie: fever, flu-like symptoms, shortness of breath, cough etc.) before then, please call [phone].  If you test positive for Covid 19 in the 2 weeks post procedure, please call and report this information to [place].   ? ?If any biopsies were taken you will be contacted by phone or by letter within the next 1-3 weeks.  Please call us at [phone] if you have not heard about the biopsies in 3 weeks.  ? ? ?SIGNATURES/CONFIDENTIALITY: ?You and/or your care partner have signed paperwork which will be entered into your electronic medical record.  These signatures attest to the fact that that the information above on your After Visit Summary has been reviewed and is understood.  Full responsibility of the confidentiality of this discharge information lies with you and/or your care-partner. ? ? ?

## 2022-01-25 NOTE — Progress Notes (Signed)
Called to room to assist during endoscopic procedure.  Patient ID and intended procedure confirmed with present staff. Received instructions for my participation in the procedure from the performing physician.  

## 2022-01-25 NOTE — Progress Notes (Signed)
Vital signs checked by:CW ? ?The patient states no changes in medical or surgical history since pre-visit screening on 01/11/22. ? ? ?

## 2022-01-26 ENCOUNTER — Other Ambulatory Visit (HOSPITAL_COMMUNITY): Payer: Self-pay

## 2022-01-29 ENCOUNTER — Telehealth: Payer: Self-pay | Admitting: *Deleted

## 2022-01-29 NOTE — Telephone Encounter (Signed)
?  Follow up Call- ? ?Call back number 01/25/2022  ?Post procedure Call Back phone  # [phone]  ?Permission to leave phone message Yes  ?Some recent data might be hidden  ?  ? ?Patient questions: ? ?Do you have a fever, pain , or abdominal swelling? No. ?Pain Score  0 * ? ?Have you tolerated food without any problems? Yes.   ? ?Have you been able to return to your normal activities? Yes.   ? ?Do you have any questions about your discharge instructions: ?Diet   No. ?Medications  No. ?Follow up visit  No. ? ?Do you have questions or concerns about your Care? No. ? ?Actions: ?* If pain score is 4 or above: ?No action needed, pain <4. ? ? ?

## 2022-02-01 ENCOUNTER — Encounter: Payer: Self-pay | Admitting: Internal Medicine

## 2022-02-04 ENCOUNTER — Other Ambulatory Visit: Payer: Self-pay | Admitting: Nurse Practitioner

## 2022-02-04 MED ORDER — METHOCARBAMOL 500 MG PO TABS
500.0000 mg | ORAL_TABLET | Freq: Three times a day (TID) | ORAL | 1 refills | Status: AC | PRN
Start: 2022-02-04 — End: 2022-03-06
  Filled 2022-02-04: qty 60, 20d supply, fill #0

## 2022-02-04 NOTE — Progress Notes (Unsigned)
? ? ?S:    ?Wendy Olson is a 46 y.o. female who presents for diabetes evaluation, education, and management. PMH is significant for T2DM, HTN, HLD. Patient was referred and last seen by Primary Care Provider, Geryl Rankins, NP, on 01/08/22. At last visit, Trulicity was started.  ? ?Today, {He/she (caps):30048} arrives in *** spirits and presents {w-w/o:315700} assistance.  ?***note mentions unable to tolerate metformin d/t GI upset - add to intolerances? Really needs PCSK9i - may have FH with LDL 231 ?Tolerate/ttrate trulicity?  ? ? ?Patient reports Diabetes was diagnosed in ***.  ? ?Family/Social History: DM and HTN in mother and father ? ?Current diabetes medications include: Trulicity 1.30 mg weekly, glipizide 5 mg daily before breakfast, Semglee 20 units daily ?Current hypertension medications include: lisinopril 20 mg daily ?Current hyperlipidemia medications include: atorvastatin 40 mg daily ? ?Patient states that {He/she (caps):30048} {Is/is not:9024} taking {his/her/their:21314} medications as prescribed. Patient {Actions; denies-reports:120008} adherence with medications. Patient states that {He/she (caps):30048} misses {his/her/their:21314} medications *** times per week, on average. ? ?Do you feel that your medications are working for you? {YES NO:22349} ?Have you been experiencing any side effects to the medications prescribed? {YES NO:22349} ?Do you have any problems obtaining medications due to transportation or finances? {YES NO:22349} ?Insurance coverage:  ? ?Patient {Actions; denies-reports:120008} hypoglycemic events. ? ?Reported home fasting blood sugars: ***  ?Reported 2 hour post-meal/random blood sugars: ***. ? ?Patient {Actions; denies-reports:120008} nocturia (nighttime urination).  ?Patient {Actions; denies-reports:120008} neuropathy (nerve pain). ?Patient {Actions; denies-reports:120008} visual changes. ?Patient {Actions; denies-reports:120008} self foot exams.  ? ?Patient reported  dietary habits: Eats *** meals/day ?Breakfast: *** ?Lunch: *** ?Dinner: *** ?Snacks: *** ?Drinks: *** ? ?Within the past 12 months, did you worry whether your food would run out before you got money to buy more? {YES NO:22349} ?Within the past 12 months, did the food you bought run out, and you didn?t have money to get more? {YES NO:22349} ? ?Patient-reported exercise habits: *** ? ? ?O:  ?7 day average blood glucose: *** ? ?Lab Results  ?Component Value Date  ? HGBA1C 9.2 (A) 01/08/2022  ? ?There were no vitals filed for this visit. ? ?Lipid Panel  ?   ?Component Value Date/Time  ? CHOL 306 (H) 01/08/2022 0958  ? TRIG 118 01/08/2022 0958  ? HDL 54 01/08/2022 0958  ? CHOLHDL 5.7 (H) 01/08/2022 8657  ? LDLCALC 231 (H) 01/08/2022 8469  ? ? ?Clinical Atherosclerotic Cardiovascular Disease (ASCVD): No  ?The 10-year ASCVD risk score (Arnett DK, et al., 2019) is: 4.5% ?  Values used to calculate the score: ?    Age: 40 years ?    Sex: Female ?    Is Non-Hispanic African American: No ?    Diabetic: Yes ?    Tobacco smoker: No ?    Systolic Blood Pressure: 629 mmHg ?    Is BP treated: Yes ?    HDL Cholesterol: 54 mg/dL ?    Total Cholesterol: 306 mg/dL  ? ? ?A/P: ?Diabetes longstanding currently uncontrolled with most recent A1c 9.2 on 01/08/22 (down from 12.3 in September 2022). Patient is able to verbalize appropriate hypoglycemia management plan. Medication adherence appears ***. Control is suboptimal due to ***. ?-*** ?-Patient educated on purpose, proper use, and potential adverse effects of ***.  ?-Extensively discussed pathophysiology of diabetes, recommended lifestyle interventions, dietary effects on blood sugar control.  ?-Counseled on s/sx of and management of hypoglycemia.  ?-Next A1c anticipated May 2023.  ? ?ASCVD risk -  primary***secondary prevention in patient with diabetes. Last LDL is*** not at goal of <*** mg/dL. ASCVD risk factors include *** and 10-year ASCVD risk score of ***. {Desc;  low/moderate/high:110033} intensity statin indicated.  ?-{Meds adjust:18428} ***statin *** mg.  ? ?Hypertension longstanding*** currently ***. Blood pressure goal of <*** mmHg. Medication adherence ***. Blood pressure control is suboptimal due to ***. ?-*** ? ?Written patient instructions provided. Patient verbalized understanding of treatment plan. Total time in face to face counseling *** minutes.   ? ?Follow up pharmacist***PCP clinic visit in ***. Patient seen with ***. ? ?

## 2022-02-05 ENCOUNTER — Ambulatory Visit: Payer: 59 | Admitting: Pharmacist

## 2022-02-05 ENCOUNTER — Other Ambulatory Visit (HOSPITAL_COMMUNITY): Payer: Self-pay

## 2022-02-05 ENCOUNTER — Other Ambulatory Visit: Payer: Self-pay

## 2022-02-12 ENCOUNTER — Other Ambulatory Visit: Payer: Self-pay

## 2022-02-14 ENCOUNTER — Other Ambulatory Visit (HOSPITAL_COMMUNITY): Payer: Self-pay

## 2022-02-15 ENCOUNTER — Ambulatory Visit: Payer: 59 | Admitting: Pharmacist

## 2022-04-09 ENCOUNTER — Ambulatory Visit: Payer: 59 | Admitting: Nurse Practitioner

## 2022-06-25 ENCOUNTER — Other Ambulatory Visit: Payer: Self-pay

## 2022-06-25 ENCOUNTER — Emergency Department (HOSPITAL_COMMUNITY)
Admission: EM | Admit: 2022-06-25 | Discharge: 2022-06-25 | Disposition: A | Discharge status: Home / Self Care | Payer: Medicaid Other | Attending: Emergency Medicine | Admitting: Emergency Medicine

## 2022-06-25 ENCOUNTER — Encounter (HOSPITAL_COMMUNITY): Payer: Self-pay | Admitting: Emergency Medicine

## 2022-06-25 DIAGNOSIS — K0889 Other specified disorders of teeth and supporting structures: Secondary | ICD-10-CM | POA: Insufficient documentation

## 2022-06-25 DIAGNOSIS — Z79899 Other long term (current) drug therapy: Secondary | ICD-10-CM | POA: Insufficient documentation

## 2022-06-25 DIAGNOSIS — Z794 Long term (current) use of insulin: Secondary | ICD-10-CM | POA: Insufficient documentation

## 2022-06-25 MED ORDER — AMOXICILLIN 500 MG PO CAPS: 500.0000 mg | ORAL_CAPSULE | Freq: Three times a day (TID) | ORAL | 0 refills | Status: DC

## 2022-06-25 NOTE — ED Provider Notes (Signed)
  [place] DEPT Provider Note   CSN: [id number] Arrival date & time: 06/25/22  1750     History {Add pertinent medical, surgical, social history, OB history to HPI:1} No chief complaint on file.   Wendy Olson is a 46 y.o. female.  HPI     Home Medications Prior to Admission medications   Medication Sig Start Date End Date Taking? Authorizing Provider  amoxicillin (AMOXIL) 500 MG capsule Take 1 capsule (500 mg total) by mouth 3 (three) times daily. 06/25/22  Yes Lorin Glass, PA-C  atorvastatin (LIPITOR) 40 MG tablet Take 1 tablet (40 mg total) by mouth daily. 01/10/22 04/10/22  Gildardo Pounds, NP  gabapentin (NEURONTIN) 300 MG capsule Take 1 capsule (300 mg total) by mouth 3 (three) times daily. 01/08/22   Gildardo Pounds, NP  glipiZIDE (GLUCOTROL) 5 MG tablet Take 1 tablet by mouth daily before breakfast. 01/08/22 04/29/22  Gildardo Pounds, NP  insulin glargine-yfgn (SEMGLEE, YFGN,) 100 UNIT/ML Pen Inject 20 Units into the skin daily. Patient taking differently: Inject 20 Units into the skin at bedtime. 01/08/22   Gildardo Pounds, NP  Insulin Pen Needle (B-D UF III MINI PEN NEEDLES) 31G X 5 MM MISC Use as instructed 01/08/22   Gildardo Pounds, NP  lisinopril (ZESTRIL) 20 MG tablet Take 1 tablet (20 mg total) by mouth daily. 01/08/22 04/08/22  Gildardo Pounds, NP      Allergies    Lobster [shellfish allergy] and Reglan [metoclopramide]    Review of Systems   Review of Systems  Physical Exam Updated Vital Signs BP (!) 152/101 (BP Location: Left Arm)   Pulse 96   Temp 98.1 F (36.7 C) (Oral)   Resp 18   Ht '5\' 7"'$  (1.702 m)   Wt 99.8 kg   SpO2 99%   BMI 34.46 kg/m  Physical Exam  ED Results / Procedures / Treatments   Labs (all labs ordered are listed, but only abnormal results are displayed) Labs Reviewed - No data to display  EKG None  Radiology No results found.  Procedures Procedures  {Document cardiac monitor,  telemetry assessment procedure when appropriate:1}  Medications Ordered in ED Medications - No data to display  ED Course/ Medical Decision Making/ A&P                           Medical Decision Making Risk Prescription drug management.   ***  {Document critical care time when appropriate:1} {Document review of labs and clinical decision tools ie heart score, Chads2Vasc2 etc:1}  {Document your independent review of radiology images, and any outside records:1} {Document your discussion with family members, caretakers, and with consultants:1} {Document social determinants of health affecting pt's care:1} {Document your decision making why or why not admission, treatments were needed:1} Final Clinical Impression(s) / ED Diagnoses Final diagnoses:  Pain, dental    Rx / DC Orders ED Discharge Orders          Ordered    amoxicillin (AMOXIL) 500 MG capsule  3 times daily        06/25/22 1949

## 2022-06-25 NOTE — ED Provider Triage Note (Signed)
Emergency Medicine Provider Triage Evaluation Note  Wendy Olson , a 46 y.o. female  was evaluated in triage.  Pt complains of left upper dental pain.  She reports that she started feeling this on Thursday  No shortness of breath, feels fine other wise   Physical Exam  BP (!) 152/101 (BP Location: Left Arm)   Pulse 96   Temp 98.1 F (36.7 C) (Oral)   Resp 18   Ht '5\' 7"'$  (1.702 m)   Wt 99.8 kg   SpO2 99%   BMI 34.46 kg/m  Gen:   Awake, no distress   Resp:  Normal effort  MSK:   Moves extremities without difficulty  Other:  No obvious facial swelling.   Medical Decision Making  Medically screening exam initiated at 7:44 PM.  Appropriate orders placed.  Wendy Olson was informed that the remainder of the evaluation will be completed by another provider, this initial triage assessment does not replace that evaluation, and the importance of remaining in the ED until their evaluation is complete.     Lorin Glass, [place] 06/25/22 1946

## 2022-06-25 NOTE — ED Triage Notes (Signed)
Patient c/o left side facial swelling and pain.  Patient reports she has a chipped molar on the left upper side.  Patient reports this has been going on for a few days.  Patient also reports needing a work note because she called out yesterday due to the pain.

## 2022-06-25 NOTE — Discharge Instructions (Addendum)
Please take Ibuprofen (Advil, motrin) and Tylenol (acetaminophen) to relieve your pain.    You may take up to 600 MG (3 pills) of normal strength ibuprofen every 8 hours as needed.   You make take tylenol, up to 1,000 mg (two extra strength pills) every 8 hours as needed.   It is safe to take ibuprofen and tylenol at the same time as they work differently.   Do not take more than 3,000 mg tylenol in a 24 hour period (not more than one dose every 8 hours.  Please check all medication labels as many medications such as pain and cold medications may contain tylenol.  Do not drink alcohol while taking these medications.  Do not take other NSAID'S while taking ibuprofen (such as aleve or naproxen).  Please take ibuprofen with food to decrease stomach upset.  You may have diarrhea from the antibiotics.  It is very important that you continue to take the antibiotics even if you get diarrhea unless a medical professional tells you that you may stop taking them.  If you stop too early the bacteria you are being treated for will become stronger and you may need different, more powerful antibiotics that have more side effects and worsening diarrhea.  Please stay well hydrated and consider probiotics as they may decrease the severity of your diarrhea.  Please be aware that if you take any hormonal contraception (birth control pills, nexplanon, the ring, etc) that your birth control will not work while you are taking antibiotics and you need to use back up protection as directed on the birth control medication information insert.

## 2022-09-19 ENCOUNTER — Emergency Department (HOSPITAL_COMMUNITY)
Admission: EM | Admit: 2022-09-19 | Discharge: 2022-09-19 | Discharge status: Against Medical Advice | Payer: Medicaid Other | Attending: Emergency Medicine | Admitting: Emergency Medicine

## 2022-09-19 ENCOUNTER — Encounter (HOSPITAL_COMMUNITY): Payer: Self-pay | Admitting: *Deleted

## 2022-09-19 ENCOUNTER — Other Ambulatory Visit: Payer: Self-pay

## 2022-09-19 DIAGNOSIS — E1165 Type 2 diabetes mellitus with hyperglycemia: Secondary | ICD-10-CM | POA: Insufficient documentation

## 2022-09-19 DIAGNOSIS — N898 Other specified noninflammatory disorders of vagina: Secondary | ICD-10-CM | POA: Insufficient documentation

## 2022-09-19 DIAGNOSIS — Z5321 Procedure and treatment not carried out due to patient leaving prior to being seen by health care provider: Secondary | ICD-10-CM | POA: Insufficient documentation

## 2022-09-19 LAB — URINALYSIS, ROUTINE W REFLEX MICROSCOPIC
Bilirubin Urine: NEGATIVE
Glucose, UA: >=500 mg/dL — AB
Ketones, ur: NEGATIVE mg/dL
Nitrite: POSITIVE — AB
Protein, ur: NEGATIVE mg/dL
Specific Gravity, Urine: 1.021 (ref 1.005–1.030)
pH: 5 (ref 5.0–8.0)

## 2022-09-19 LAB — CBG MONITORING, ED
Glucose-Capillary: 303 mg/dL — ABNORMAL HIGH (ref 70–99)
Glucose-Capillary: 254 mg/dL — ABNORMAL HIGH (ref 70–99)

## 2022-09-19 LAB — CBC
HCT: 41.4 % (ref 36.0–46.0)
Hemoglobin: 14.6 g/dL (ref 12.0–15.0)
MCH: 31.1 pg (ref 26.0–34.0)
MCHC: 35.3 g/dL (ref 30.0–36.0)
MCV: 88.1 fL (ref 80.0–100.0)
Platelets: 301 10*3/uL (ref 150–400)
RBC: 4.7 MIL/uL (ref 3.87–5.11)
RDW: 11.9 % (ref 11.5–15.5)
WBC: 6.9 10*3/uL (ref 4.0–10.5)
nRBC: 0 % (ref 0.0–0.2)

## 2022-09-19 LAB — BASIC METABOLIC PANEL
Anion gap: 7 (ref 5–15)
BUN: 12 mg/dL (ref 6–20)
CO2: 24 mmol/L (ref 22–32)
Calcium: 9.5 mg/dL (ref 8.9–10.3)
Chloride: 102 mmol/L (ref 98–111)
Creatinine, Ser: 0.55 mg/dL (ref 0.44–1.00)
GFR, Estimated: >60 mL/min (ref >60)
Glucose, Bld: 320 mg/dL — ABNORMAL HIGH (ref 70–99)
Potassium: 3.7 mmol/L (ref 3.5–5.1)
Sodium: 133 mmol/L — ABNORMAL LOW (ref 135–145)

## 2022-09-19 LAB — PREGNANCY, URINE: Preg Test, Ur: NEGATIVE

## 2022-09-19 NOTE — ED Triage Notes (Signed)
Pt reports that she has been having some perineal itching and white vaginal discharge for about 1 week. Has been using OTC vaginal cream without relief. Pt reports she has not been checking her sugars and has not had her diabetes medications in about a month (lantus and glipizide)

## 2022-09-21 ENCOUNTER — Encounter: Payer: Self-pay | Admitting: Nurse Practitioner

## 2022-09-21 ENCOUNTER — Other Ambulatory Visit (HOSPITAL_COMMUNITY): Payer: Self-pay

## 2022-09-21 ENCOUNTER — Other Ambulatory Visit: Payer: Self-pay | Admitting: Pharmacist

## 2022-09-21 ENCOUNTER — Other Ambulatory Visit: Payer: Self-pay

## 2022-09-21 MED ORDER — BASAGLAR KWIKPEN 100 UNIT/ML ~~LOC~~ SOPN
20.0000 [IU] | PEN_INJECTOR | Freq: Every day | SUBCUTANEOUS | 0 refills | Status: DC
  Filled 2022-09-21 – 2023-01-07 (×3): qty 6, 30d supply, fill #0

## 2022-09-22 ENCOUNTER — Other Ambulatory Visit (HOSPITAL_COMMUNITY): Payer: Self-pay

## 2022-09-24 ENCOUNTER — Other Ambulatory Visit (HOSPITAL_COMMUNITY): Payer: Self-pay

## 2022-09-28 ENCOUNTER — Other Ambulatory Visit: Payer: Self-pay

## 2022-10-05 ENCOUNTER — Other Ambulatory Visit (HOSPITAL_COMMUNITY): Payer: Self-pay

## 2022-11-05 ENCOUNTER — Ambulatory Visit: Payer: Self-pay | Admitting: Nurse Practitioner

## 2022-11-13 ENCOUNTER — Other Ambulatory Visit: Payer: Self-pay

## 2023-01-06 ENCOUNTER — Emergency Department (HOSPITAL_COMMUNITY)
Admission: EM | Admit: 2023-01-06 | Discharge: 2023-01-06 | Disposition: A | Discharge status: Home / Self Care | Payer: Self-pay | Attending: Emergency Medicine | Admitting: Emergency Medicine

## 2023-01-06 ENCOUNTER — Other Ambulatory Visit: Payer: Self-pay

## 2023-01-06 DIAGNOSIS — R739 Hyperglycemia, unspecified: Secondary | ICD-10-CM

## 2023-01-06 DIAGNOSIS — H538 Other visual disturbances: Secondary | ICD-10-CM

## 2023-01-06 DIAGNOSIS — I10 Essential (primary) hypertension: Secondary | ICD-10-CM | POA: Insufficient documentation

## 2023-01-06 DIAGNOSIS — Z794 Long term (current) use of insulin: Secondary | ICD-10-CM | POA: Insufficient documentation

## 2023-01-06 DIAGNOSIS — E1165 Type 2 diabetes mellitus with hyperglycemia: Secondary | ICD-10-CM | POA: Insufficient documentation

## 2023-01-06 DIAGNOSIS — Z79899 Other long term (current) drug therapy: Secondary | ICD-10-CM | POA: Insufficient documentation

## 2023-01-06 DIAGNOSIS — Z7984 Long term (current) use of oral hypoglycemic drugs: Secondary | ICD-10-CM | POA: Insufficient documentation

## 2023-01-06 DIAGNOSIS — N3000 Acute cystitis without hematuria: Secondary | ICD-10-CM

## 2023-01-06 LAB — URINALYSIS, ROUTINE W REFLEX MICROSCOPIC
Bacteria, UA: NONE SEEN
Bilirubin Urine: NEGATIVE
Glucose, UA: >=500 mg/dL — AB
Hgb urine dipstick: NEGATIVE
Ketones, ur: NEGATIVE mg/dL
Nitrite: NEGATIVE
Protein, ur: NEGATIVE mg/dL
Specific Gravity, Urine: 1.028 (ref 1.005–1.030)
WBC, UA: >50 WBC/hpf (ref 0–5)
pH: 6 (ref 5.0–8.0)

## 2023-01-06 LAB — COMPREHENSIVE METABOLIC PANEL
ALT: 43 U/L (ref 0–44)
AST: 29 U/L (ref 15–41)
Albumin: 3.4 g/dL — ABNORMAL LOW (ref 3.5–5.0)
Alkaline Phosphatase: 196 U/L — ABNORMAL HIGH (ref 38–126)
Anion gap: 7 (ref 5–15)
BUN: 9 mg/dL (ref 6–20)
CO2: 23 mmol/L (ref 22–32)
Calcium: 8.3 mg/dL — ABNORMAL LOW (ref 8.9–10.3)
Chloride: 106 mmol/L (ref 98–111)
Creatinine, Ser: 0.58 mg/dL (ref 0.44–1.00)
GFR, Estimated: >60 mL/min (ref >60)
Glucose, Bld: 373 mg/dL — ABNORMAL HIGH (ref 70–99)
Potassium: 3.8 mmol/L (ref 3.5–5.1)
Sodium: 136 mmol/L (ref 135–145)
Total Bilirubin: 0.6 mg/dL (ref 0.3–1.2)
Total Protein: 6.5 g/dL (ref 6.5–8.1)

## 2023-01-06 LAB — CBC WITH DIFFERENTIAL/PLATELET
Abs Immature Granulocytes: 0.01 10*3/uL (ref 0.00–0.07)
Basophils Absolute: 0 10*3/uL (ref 0.0–0.1)
Basophils Relative: 0 %
Eosinophils Absolute: 0.1 10*3/uL (ref 0.0–0.5)
Eosinophils Relative: 2 %
HCT: 40.4 % (ref 36.0–46.0)
Hemoglobin: 14.1 g/dL (ref 12.0–15.0)
Immature Granulocytes: 0 %
Lymphocytes Relative: 24 %
Lymphs Abs: 1.4 10*3/uL (ref 0.7–4.0)
MCH: 30.8 pg (ref 26.0–34.0)
MCHC: 34.9 g/dL (ref 30.0–36.0)
MCV: 88.2 fL (ref 80.0–100.0)
Monocytes Absolute: 0.4 10*3/uL (ref 0.1–1.0)
Monocytes Relative: 7 %
Neutro Abs: 3.7 10*3/uL (ref 1.7–7.7)
Neutrophils Relative %: 67 %
Platelets: 246 10*3/uL (ref 150–400)
RBC: 4.58 MIL/uL (ref 3.87–5.11)
RDW: 11.8 % (ref 11.5–15.5)
WBC: 5.7 10*3/uL (ref 4.0–10.5)
nRBC: 0 % (ref 0.0–0.2)

## 2023-01-06 LAB — CBG MONITORING, ED
Glucose-Capillary: 416 mg/dL — ABNORMAL HIGH (ref 70–99)
Glucose-Capillary: 319 mg/dL — ABNORMAL HIGH (ref 70–99)

## 2023-01-06 MED ORDER — FLUCONAZOLE 150 MG PO TABS
150.0000 mg | ORAL_TABLET | Freq: Every day | ORAL | 0 refills | Status: AC
  Filled 2023-01-06 – 2023-01-07 (×2): qty 1, 1d supply, fill #0

## 2023-01-06 MED ORDER — NITROFURANTOIN MONOHYD MACRO 100 MG PO CAPS
100.0000 mg | ORAL_CAPSULE | Freq: Two times a day (BID) | ORAL | 0 refills | Status: DC
  Filled 2023-01-06 – 2023-01-07 (×2): qty 14, 7d supply, fill #0

## 2023-01-06 MED ORDER — SODIUM CHLORIDE 0.9 % IV BOLUS
1000.0000 mL | Freq: Once | INTRAVENOUS | Status: AC
  Administered 2023-01-06: 1000 mL via INTRAVENOUS

## 2023-01-06 MED ORDER — INSULIN ASPART 100 UNIT/ML IJ SOLN
6.0000 [IU] | Freq: Once | INTRAMUSCULAR | Status: AC
  Administered 2023-01-06: 6 [IU] via SUBCUTANEOUS
  Filled 2023-01-06: qty 0.06

## 2023-01-06 NOTE — Discharge Instructions (Addendum)
You are seen in the emergency room for blurry vision, weakness, elevated sugar and some urinary complaints.  Lab results are overall reassuring.  We suspect that you have a bladder infection, take the antibiotics that are prescribed.  Diflucan is a yeast medication that you need to take after you have completed the antibiotic course.  For your blurry vision you will need to follow-up with your primary care doctor.  Return to the emergency room if you start having vision loss, double vision, one-sided weakness, numbness, constant dizziness, slurred speech.  Also return to the ER if you start having worsening blood sugar, fevers, chills, severe nausea and vomiting.

## 2023-01-06 NOTE — ED Provider Notes (Signed)
[place] EMERGENCY DEPARTMENT AT [facility] Provider Note   CSN: [id number] Arrival date & time: 01/06/23  [id number]     History  Chief Complaint  Patient presents with   Blurred Vision    Wendy Olson is a 47 y.o. female.  HPI    48 year old female comes in with chief complaint of blurry vision. Patient has history of diabetes, hypertension.  Patient states that this morning when she woke up, she felt that her vision was blurry and she was feeling sluggish.  She checked her blood sugar, and it was running close to 500.  She continued to feel unwell, therefore she decided to call EMS and come to the ER.  Patient has been taking her medications as prescribed.  She was feeling well last night.  She has baseline blurry vision, but it seems worse.  She denies any associated one-sided weakness, numbness, double vision, vision loss, slurred speech, balance issues.   Patient denies any recent URI-like symptoms.  She also denies any fevers, chills.  She does indicate however that she has had some diarrhea like symptoms, going 2 times a day for the last 5 days.  The diarrhea is nonbloody.  She also indicates that she is having burning with urination and irritation around the vaginal area.  She has not noticed any vaginal discharge or blood in the urine.  Home Medications Prior to Admission medications   Medication Sig Start Date End Date Taking? Authorizing Provider  fluconazole (DIFLUCAN) 150 MG tablet Take 1 tablet (150 mg total) by mouth daily for 1 day. Take this pill after you have completed the course of antibiotics for yeast infection 01/06/23 01/07/23 Yes Raidyn Wassink, MD  nitrofurantoin, macrocrystal-monohydrate, (MACROBID) 100 MG capsule Take 1 capsule (100 mg total) by mouth 2 (two) times daily. 01/06/23  Yes Varney Biles, MD  amoxicillin (AMOXIL) 500 MG capsule Take 1 capsule (500 mg total) by mouth 3 (three) times daily. 06/25/22   Lorin Glass, PA-C   atorvastatin (LIPITOR) 40 MG tablet Take 1 tablet (40 mg total) by mouth daily. 01/10/22 04/10/22  Gildardo Pounds, NP  gabapentin (NEURONTIN) 300 MG capsule Take 1 capsule (300 mg total) by mouth 3 (three) times daily. 01/08/22   Gildardo Pounds, NP  glipiZIDE (GLUCOTROL) 5 MG tablet Take 1 tablet by mouth daily before breakfast. 01/08/22 04/29/22  Gildardo Pounds, NP  Insulin Glargine [facility]) 100 UNIT/ML Inject 20 Units into the skin daily. 09/21/22   Gildardo Pounds, NP  Insulin Pen Needle (B-D UF III MINI PEN NEEDLES) 31G X 5 MM MISC Use as instructed 01/08/22   Gildardo Pounds, NP  lisinopril (ZESTRIL) 20 MG tablet Take 1 tablet (20 mg total) by mouth daily. 01/08/22 04/08/22  Gildardo Pounds, NP      Allergies    Lobster [shellfish allergy] and Reglan [metoclopramide]    Review of Systems   Review of Systems  All other systems reviewed and are negative.   Physical Exam Updated Vital Signs BP (!) 161/97 (BP Location: Left Arm)   Pulse 100   Temp 98.1 F (36.7 C) (Oral)   Resp 16   Ht 5' 7"$  (1.702 m)   Wt 98.9 kg   SpO2 97%   BMI 34.14 kg/m  Physical Exam Vitals and nursing note reviewed.  Constitutional:      Appearance: She is well-developed.  HENT:     Head: Atraumatic.  Cardiovascular:     Rate and Rhythm:  Normal rate.  Pulmonary:     Effort: Pulmonary effort is normal.  Musculoskeletal:     Cervical back: Normal range of motion and neck supple.  Skin:    General: Skin is warm and dry.  Neurological:     Mental Status: She is alert and oriented to person, place, and time.     ED Results / Procedures / Treatments   Labs (all labs ordered are listed, but only abnormal results are displayed) Labs Reviewed  COMPREHENSIVE METABOLIC PANEL - Abnormal; Notable for the following components:      Result Value   Glucose, Bld 373 (*)    Calcium 8.3 (*)    Albumin 3.4 (*)    Alkaline Phosphatase 196 (*)    All other components within normal limits   URINALYSIS, ROUTINE W REFLEX MICROSCOPIC - Abnormal; Notable for the following components:   Color, Urine STRAW (*)    Glucose, UA >=500 (*)    Leukocytes,Ua MODERATE (*)    All other components within normal limits  CBG MONITORING, ED - Abnormal; Notable for the following components:   Glucose-Capillary 416 (*)    All other components within normal limits  CBC WITH DIFFERENTIAL/PLATELET    EKG None  Radiology No results found.  Procedures Procedures    Medications Ordered in ED Medications  insulin aspart (novoLOG) injection 6 Units (has no administration in time range)  sodium chloride 0.9 % bolus 1,000 mL (0 mLs Intravenous Stopped 01/06/23 1144)    ED Course/ Medical Decision Making/ A&P                             Medical Decision Making This patient presents to the ED with chief complaint(s) of elevated blood sugar, generalized weakness, urinary symptoms with pertinent past medical history of hypertension, diabetes.The complaint involves an extensive differential diagnosis and also carries with it a high risk of complications and morbidity.    The differential diagnosis includes : Diabetic ketoacidosis, hyperglycemic crisis, cystitis, yeast infection, ileus, colitis, gastroenteritis. Patient has binocular blurry vision, but has completely nonfocal neuroexam otherwise.  She has no peripheral vision loss.  I do not think she is having stroke, but hyperglycemia and elevated blood pressure could be contributing.  The initial plan is to get basic labs, urine analysis, give patient IV fluid and reassess.   Independent labs interpretation:  The following labs were independently interpreted: Patient has hyperglycemia without ketosis.  No anion gap.  White count is normal.  No severe anemia.  UA does have pyuria and there is some bacteria seen on clean-catch.  I have interpreted patient's EKG.  No evidence of acute MI.  Treatment and Reassessment: Results of the ER  discussed with the patient.  She once again reiterates that she is having some burning with urination, some pressure type feeling in her pelvic region and itching/irritation.  No vaginal discharge.  She denies any STI risk factors right now.  No history of STI. Possibly she is having cystitis.  We will give her Diflucan for post antibiotic usage.  Social determinants of health: Poor access to healthcare due to insurance issues  Problems Addressed: Acute cystitis without hematuria: acute illness or injury Blurred vision, bilateral: undiagnosed new problem with uncertain prognosis Hyperglycemia: acute illness or injury  Amount and/or Complexity of Data Reviewed Labs: ordered.  Risk Prescription drug management.    Final Clinical Impression(s) / ED Diagnoses Final diagnoses:  Blurred vision, bilateral  Acute cystitis without hematuria  Hyperglycemia    Rx / DC Orders ED Discharge Orders          Ordered    nitrofurantoin, macrocrystal-monohydrate, (MACROBID) 100 MG capsule  2 times daily        01/06/23 1135    fluconazole (DIFLUCAN) 150 MG tablet  Daily        01/06/23 1136              Varney Biles, MD 01/06/23 1149

## 2023-01-06 NOTE — ED Triage Notes (Signed)
Pt presents to ED from home C/O hyperglycemia (CBG at home 125-130), ran out of glipizide. Pt also endorses blurred vision, nausea, diarrhea. Pt denies vomiting. Pt states she has not taken meds for HTN in several days d/t running out.

## 2023-01-07 ENCOUNTER — Other Ambulatory Visit: Payer: Self-pay

## 2023-01-08 ENCOUNTER — Other Ambulatory Visit: Payer: Self-pay

## 2023-01-25 ENCOUNTER — Other Ambulatory Visit: Payer: Self-pay | Admitting: Nurse Practitioner

## 2023-01-26 ENCOUNTER — Encounter: Payer: Self-pay | Admitting: Nurse Practitioner

## 2023-01-28 ENCOUNTER — Other Ambulatory Visit (HOSPITAL_COMMUNITY): Payer: Self-pay

## 2023-01-28 MED ORDER — BASAGLAR KWIKPEN 100 UNIT/ML ~~LOC~~ SOPN
20.0000 [IU] | PEN_INJECTOR | Freq: Every day | SUBCUTANEOUS | 0 refills | Status: DC
  Filled 2023-01-28 – 2023-01-31 (×3): qty 6, 30d supply, fill #0

## 2023-01-30 ENCOUNTER — Other Ambulatory Visit: Payer: Self-pay

## 2023-01-30 ENCOUNTER — Other Ambulatory Visit (HOSPITAL_COMMUNITY): Payer: Self-pay

## 2023-01-31 ENCOUNTER — Other Ambulatory Visit: Payer: Self-pay

## 2023-02-02 ENCOUNTER — Other Ambulatory Visit: Payer: Self-pay | Admitting: Nurse Practitioner

## 2023-02-02 ENCOUNTER — Encounter: Payer: Self-pay | Admitting: Nurse Practitioner

## 2023-02-02 DIAGNOSIS — E1165 Type 2 diabetes mellitus with hyperglycemia: Secondary | ICD-10-CM

## 2023-02-02 MED ORDER — GLIPIZIDE 5 MG PO TABS
5.0000 mg | ORAL_TABLET | Freq: Every day | ORAL | 0 refills | Status: DC
  Filled 2023-02-02 – 2023-03-05 (×3): qty 17, 17d supply, fill #0

## 2023-02-04 ENCOUNTER — Other Ambulatory Visit (HOSPITAL_COMMUNITY): Payer: Self-pay

## 2023-02-05 ENCOUNTER — Other Ambulatory Visit (HOSPITAL_COMMUNITY): Payer: Self-pay

## 2023-02-06 ENCOUNTER — Other Ambulatory Visit: Payer: Self-pay

## 2023-02-11 ENCOUNTER — Other Ambulatory Visit: Payer: Self-pay

## 2023-02-18 ENCOUNTER — Ambulatory Visit: Payer: Self-pay | Attending: Nurse Practitioner | Admitting: Nurse Practitioner

## 2023-02-25 ENCOUNTER — Other Ambulatory Visit: Payer: Self-pay | Admitting: Family Medicine

## 2023-02-25 ENCOUNTER — Other Ambulatory Visit (HOSPITAL_COMMUNITY): Payer: Self-pay

## 2023-02-26 NOTE — Telephone Encounter (Signed)
Requested medication (s) are due for refill today - yes  Requested medication (s) are on the active medication list -yes  Future visit scheduled -no  Last refill: 01/28/23 38ml  Notes to clinic: Last fill was courtesy fill- did not keep appointment - sent for review of request  Requested Prescriptions  Pending Prescriptions Disp Refills   Insulin Glargine (BASAGLAR KWIKPEN) 100 UNIT/ML 6 mL 0    Sig: Inject 20 Units into the skin daily.     Endocrinology:  Diabetes - Insulins Failed - 02/25/2023  3:51 PM      Failed - HBA1C is between 0 and 7.9 and within 180 days    Hemoglobin A1C  Date Value Ref Range Status  01/08/2022 9.2 (A) 4.0 - 5.6 % Final   Hgb A1c MFr Bld  Date Value Ref Range Status  02/05/2020 10.9 (H) 4.8 - 5.6 % Final    Comment:             Prediabetes: 5.7 - 6.4          Diabetes: >6.4          Glycemic control for adults with diabetes: <7.0          Failed - Valid encounter within last 6 months    Recent Outpatient Visits           1 year ago Type 2 diabetes mellitus with hyperglycemia, with long-term current use of insulin [facility])   [place], [place] W, NP   1 year ago Type 2 diabetes mellitus with hyperglycemia, without long-term current use of insulin [facility])   [place] [facility], Kriste Basque, NP   2 years ago Encounter for Papanicolaou smear for cervical cancer screening   [place], [place] W, NP   2 years ago Type 2 diabetes mellitus with diabetic polyneuropathy, with long-term current use of insulin [facility])   [place], [place] W, NP   3 years ago Type 2 diabetes mellitus with diabetic polyneuropathy, with long-term current use of insulin [facility])   [place], Vernia Buff, NP                 Requested Prescriptions  Pending Prescriptions Disp Refills    Insulin Glargine (BASAGLAR KWIKPEN) 100 UNIT/ML 6 mL 0    Sig: Inject 20 Units into the skin daily.     Endocrinology:  Diabetes - Insulins Failed - 02/25/2023  3:51 PM      Failed - HBA1C is between 0 and 7.9 and within 180 days    Hemoglobin A1C  Date Value Ref Range Status  01/08/2022 9.2 (A) 4.0 - 5.6 % Final   Hgb A1c MFr Bld  Date Value Ref Range Status  02/05/2020 10.9 (H) 4.8 - 5.6 % Final    Comment:             Prediabetes: 5.7 - 6.4          Diabetes: >6.4          Glycemic control for adults with diabetes: <7.0          Failed - Valid encounter within last 6 months    Recent Outpatient Visits           1 year ago Type 2 diabetes mellitus with hyperglycemia, with long-term current use of insulin ([place])  [place], [place] W, NP   1 year ago Type 2 diabetes mellitus with hyperglycemia, without long-term current use of insulin [facility])   [place] [facility], Kriste Basque, NP   2 years ago Encounter for Papanicolaou smear for cervical cancer screening   [place], [place] W, NP   2 years ago Type 2 diabetes mellitus with diabetic polyneuropathy, with long-term current use of insulin [facility])   [place], [place] W, NP   3 years ago Type 2 diabetes mellitus with diabetic polyneuropathy, with long-term current use of insulin [facility])   [place], Vernia Buff, NP

## 2023-03-05 ENCOUNTER — Other Ambulatory Visit: Payer: Self-pay | Admitting: Family Medicine

## 2023-03-06 ENCOUNTER — Other Ambulatory Visit: Payer: Self-pay

## 2023-03-06 ENCOUNTER — Other Ambulatory Visit (HOSPITAL_COMMUNITY): Payer: Self-pay

## 2023-03-07 ENCOUNTER — Other Ambulatory Visit: Payer: Self-pay

## 2023-03-11 ENCOUNTER — Other Ambulatory Visit: Payer: Self-pay | Admitting: Family Medicine

## 2023-03-11 ENCOUNTER — Other Ambulatory Visit: Payer: Self-pay | Admitting: Nurse Practitioner

## 2023-03-11 DIAGNOSIS — E1165 Type 2 diabetes mellitus with hyperglycemia: Secondary | ICD-10-CM

## 2023-03-11 DIAGNOSIS — I10 Essential (primary) hypertension: Secondary | ICD-10-CM

## 2023-03-13 ENCOUNTER — Other Ambulatory Visit: Payer: Self-pay | Admitting: Family Medicine

## 2023-03-13 ENCOUNTER — Other Ambulatory Visit: Payer: Self-pay | Admitting: Nurse Practitioner

## 2023-03-13 DIAGNOSIS — Z794 Long term (current) use of insulin: Secondary | ICD-10-CM

## 2023-03-13 DIAGNOSIS — I10 Essential (primary) hypertension: Secondary | ICD-10-CM

## 2023-03-15 ENCOUNTER — Other Ambulatory Visit: Payer: Self-pay

## 2023-03-15 MED ORDER — BASAGLAR KWIKPEN 100 UNIT/ML ~~LOC~~ SOPN
20.0000 [IU] | PEN_INJECTOR | Freq: Every day | SUBCUTANEOUS | 0 refills | Status: DC
  Filled 2023-03-15: qty 6, 30d supply, fill #0

## 2023-03-15 MED ORDER — LISINOPRIL 20 MG PO TABS
20.0000 mg | ORAL_TABLET | Freq: Every day | ORAL | 0 refills | Status: DC
  Filled 2023-03-15: qty 30, 30d supply, fill #0

## 2023-03-15 MED ORDER — GLIPIZIDE 5 MG PO TABS
5.0000 mg | ORAL_TABLET | Freq: Every day | ORAL | 0 refills | Status: DC
  Filled 2023-03-15 – 2023-03-20 (×3): qty 30, 30d supply, fill #0

## 2023-03-18 ENCOUNTER — Other Ambulatory Visit: Payer: Self-pay

## 2023-03-20 ENCOUNTER — Other Ambulatory Visit: Payer: Self-pay

## 2023-03-21 ENCOUNTER — Other Ambulatory Visit: Payer: Self-pay

## 2023-04-13 ENCOUNTER — Emergency Department (HOSPITAL_COMMUNITY): Payer: Medicaid Other

## 2023-04-13 ENCOUNTER — Encounter (HOSPITAL_COMMUNITY): Payer: Self-pay

## 2023-04-13 ENCOUNTER — Other Ambulatory Visit: Payer: Self-pay

## 2023-04-13 ENCOUNTER — Emergency Department (HOSPITAL_COMMUNITY)
Admission: EM | Admit: 2023-04-13 | Discharge: 2023-04-13 | Disposition: A | Discharge status: Home / Self Care | Payer: Medicaid Other | Attending: Emergency Medicine | Admitting: Emergency Medicine

## 2023-04-13 DIAGNOSIS — Z79899 Other long term (current) drug therapy: Secondary | ICD-10-CM | POA: Insufficient documentation

## 2023-04-13 DIAGNOSIS — M1711 Unilateral primary osteoarthritis, right knee: Secondary | ICD-10-CM | POA: Diagnosis not present

## 2023-04-13 DIAGNOSIS — S8991XA Unspecified injury of right lower leg, initial encounter: Secondary | ICD-10-CM | POA: Diagnosis present

## 2023-04-13 DIAGNOSIS — I10 Essential (primary) hypertension: Secondary | ICD-10-CM | POA: Insufficient documentation

## 2023-04-13 DIAGNOSIS — E119 Type 2 diabetes mellitus without complications: Secondary | ICD-10-CM | POA: Insufficient documentation

## 2023-04-13 DIAGNOSIS — J45909 Unspecified asthma, uncomplicated: Secondary | ICD-10-CM | POA: Diagnosis not present

## 2023-04-13 DIAGNOSIS — X58XXXA Exposure to other specified factors, initial encounter: Secondary | ICD-10-CM | POA: Insufficient documentation

## 2023-04-13 DIAGNOSIS — Z794 Long term (current) use of insulin: Secondary | ICD-10-CM | POA: Diagnosis not present

## 2023-04-13 DIAGNOSIS — Z7984 Long term (current) use of oral hypoglycemic drugs: Secondary | ICD-10-CM | POA: Insufficient documentation

## 2023-04-13 DIAGNOSIS — S8391XA Sprain of unspecified site of right knee, initial encounter: Secondary | ICD-10-CM | POA: Diagnosis not present

## 2023-04-13 MED ORDER — IBUPROFEN 600 MG PO TABS
600.0000 mg | ORAL_TABLET | Freq: Three times a day (TID) | ORAL | 0 refills | Status: AC | PRN
  Filled 2023-04-13: qty 15, 5d supply, fill #0

## 2023-04-13 MED ORDER — KETOROLAC TROMETHAMINE 15 MG/ML IJ SOLN
15.0000 mg | Freq: Once | INTRAMUSCULAR | Status: AC
  Administered 2023-04-13: 15 mg via INTRAMUSCULAR
  Filled 2023-04-13: qty 1

## 2023-04-13 NOTE — Discharge Instructions (Signed)
Your x-ray showed some changes related to arthritis but no fractures, dislocations, or other abnormalities. You likely have sprained your knee. We will immobilize the knee and provide you with crutches to assist with walking. You may begin to bear weight on the leg over the next few days as tolerated. Please see attached instructions for RICE therapy to manage your injury at home.  You do have a fair amount of arthritis for your age so I do recommend following up with your PCP and/or orthopedics for any continued symptoms. I prescribed high dose ibuprofen which can help with pain and inflammation. You may also take over the counter Tylenol as needed. For any new or worsening symptoms, please return to ED for re-evaluation.

## 2023-04-13 NOTE — ED Triage Notes (Signed)
Pt c/o anterior R knee pain.  Pain score 8/10.  Pt reports she "may have stepped out of the car wrong."  Pt has reports taking tylenol last night w/ some relief.

## 2023-04-13 NOTE — ED Provider Notes (Addendum)
[place] EMERGENCY DEPARTMENT AT [facility] Provider Note   CSN: [id number] Arrival date & time: 04/13/23  4098     History  Chief Complaint  Patient presents with   Knee Pain    Wendy Olson is a 47 y.o. female with past medical history diabetes, asthma, hypertension, hyperlipidemia, anxiety who presents to the ED complaining of right knee pain since yesterday.  She reports that she was getting out of a truck when she suddenly felt pain in the knee.  No fall or direct trauma to the knee.  Describes pain is to the anterior aspect of the knee.  No history of knee problems.  Blood sugars have been in the 120s at home. No fever, other joint pains, or other symptoms today. Reports she is unable to bear weight on knee secondary to pain.       Home Medications Prior to Admission medications   Medication Sig Start Date End Date Taking? Authorizing Provider  ibuprofen (ADVIL) 600 MG tablet Take 1 tablet (600 mg total) by mouth every 8 (eight) hours as needed for up to 5 days for mild pain or moderate pain. 04/13/23 04/18/23 Yes Mahoganie Basher L, PA-C  amoxicillin (AMOXIL) 500 MG capsule Take 1 capsule (500 mg total) by mouth 3 (three) times daily. 06/25/22   Cristina Gong, PA-C  atorvastatin (LIPITOR) 40 MG tablet Take 1 tablet (40 mg total) by mouth daily. 01/10/22 04/10/22  Claiborne Rigg, NP  gabapentin (NEURONTIN) 300 MG capsule Take 1 capsule (300 mg total) by mouth 3 (three) times daily. 01/08/22   Claiborne Rigg, NP  glipiZIDE (GLUCOTROL) 5 MG tablet Take 1 tablet (5 mg total) by mouth daily before breakfast. 03/15/23   Claiborne Rigg, NP  Insulin Glargine (BASAGLAR KWIKPEN) 100 UNIT/ML Inject 20 Units into the skin daily. 03/15/23   Claiborne Rigg, NP  Insulin Pen Needle (B-D UF III MINI PEN NEEDLES) 31G X 5 MM MISC Use as instructed 01/08/22   Claiborne Rigg, NP  lisinopril (ZESTRIL) 20 MG tablet Take 1 tablet (20 mg total) by mouth daily. 03/15/23   Claiborne Rigg, NP  nitrofurantoin, macrocrystal-monohydrate, (MACROBID) 100 MG capsule Take 1 capsule (100 mg total) by mouth 2 (two) times daily. 01/06/23   Derwood Kaplan, MD      Allergies    Lobster [shellfish allergy] and Reglan [metoclopramide]    Review of Systems   Review of Systems  All other systems reviewed and are negative.   Physical Exam Updated Vital Signs BP (!) 158/110 (BP Location: Right Arm)   Pulse 82   Temp 98 F (36.7 C) (Oral)   Resp 18   Ht 5\' 6"  (1.676 m)   Wt 99.8 kg   SpO2 100%   BMI 35.51 kg/m  Physical Exam Vitals and nursing note reviewed.  Constitutional:      General: She is not in acute distress.    Appearance: Normal appearance.  HENT:     Head: Normocephalic and atraumatic.     Mouth/Throat:     Mouth: Mucous membranes are moist.  Eyes:     Conjunctiva/sclera: Conjunctivae normal.  Cardiovascular:     Rate and Rhythm: Normal rate and regular rhythm.     Heart sounds: No murmur heard. Pulmonary:     Effort: Pulmonary effort is normal.     Breath sounds: Normal breath sounds.  Abdominal:     General: Abdomen is flat.     Palpations: Abdomen  is soft.  Musculoskeletal:     Cervical back: Neck supple.     Comments: Diffuse tenderness over patella, stable joint, no obvious deformity, no tenderness to other aspects of knee, no erythema, increased warmth, or wounds, no tenderness to tib-fib, femur, or foot, soft compartments to right lower extremity, normal sensation, 2+ DP and PT pulses, no calf tenderness bilaterally  Skin:    General: Skin is warm and dry.     Capillary Refill: Capillary refill takes less than 2 seconds.  Neurological:     Mental Status: She is alert. Mental status is at baseline.  Psychiatric:        Behavior: Behavior normal.     ED Results / Procedures / Treatments   Labs (all labs ordered are listed, but only abnormal results are displayed) Labs Reviewed - No data to display  EKG None  Radiology DG Knee  Complete 4 Views Right  Result Date: 04/13/2023 CLINICAL DATA:  47 year old female with knee pain. EXAM: RIGHT KNEE - COMPLETE 4+ VIEW COMPARISON:  None Available. FINDINGS: Bone mineralization is within normal limits. No evidence of joint effusion on cross-table lateral view. Maintained alignment. Patella intact. No acute osseous abnormality identified. Relatively preserved joint spaces but there is some age advanced medial and lateral compartment degenerative spurring. Soft tissue contours appear to remain normal. IMPRESSION: Some medial and lateral compartment degenerative changes which are advanced for age. No evidence of joint effusion or acute osseous abnormality. Electronically Signed   By: Odessa Fleming M.D.   On: 04/13/2023 07:52    Procedures Procedures    Medications Ordered in ED Medications  ketorolac (TORADOL) 15 MG/ML injection 15 mg (15 mg Intramuscular Given 04/13/23 0750)    ED Course/ Medical Decision Making/ A&P                             Medical Decision Making Amount and/or Complexity of Data Reviewed Radiology: ordered. Decision-making details documented in ED Course.  Risk Prescription drug management.   Medical Decision Making:   Wendy Olson is a 47 y.o. female who presented to the ED today with knee pain detailed above.     Complete initial physical exam performed, notably the patient was in NAD.  Diffuse tenderness over the right patella.  Soft compartments.  Neurovascularly intact.  Stable joint.  No overlying erythema or increased warmth.    Reviewed and confirmed nursing documentation for past medical history, family history, social history.    Initial Assessment:   With the patient's presentation, differential diagnosis includes but is not limited to sprain, strain, fracture, dislocation, compartment syndrome, septic joint, DVT.  This is most consistent with an acute complicated illness  Initial Plan:  XR to evaluate for bony pathology Pain  management Objective evaluation as below reviewed   Initial Study Results:   Radiology:  All images reviewed independently. Agree with radiology report at this time.   DG Knee Complete 4 Views Right  Result Date: 04/13/2023 CLINICAL DATA:  47 year old female with knee pain. EXAM: RIGHT KNEE - COMPLETE 4+ VIEW COMPARISON:  None Available. FINDINGS: Bone mineralization is within normal limits. No evidence of joint effusion on cross-table lateral view. Maintained alignment. Patella intact. No acute osseous abnormality identified. Relatively preserved joint spaces but there is some age advanced medial and lateral compartment degenerative spurring. Soft tissue contours appear to remain normal. IMPRESSION: Some medial and lateral compartment degenerative changes which are advanced for age. No  evidence of joint effusion or acute osseous abnormality. Electronically Signed   By: Odessa Fleming M.D.   On: 04/13/2023 07:52      Final Assessment and Plan:   47 year old female presenting to the ED with right knee pain.  Notes that started after getting out of a car but no direct injury.  She is tender over the patella but no joint laxity, no overlying skin changes, superior or inferior joint involvement, and patient is neurovascularly intact.  X-ray with degenerative changes but no acute bony abnormalities.  Patient reports difficulty with weightbearing so we we will provide knee sleeve and crutches to assist with ambulation and have closely follow-up with orthopedics.  Strict ED return precautions given, all questions answered, and stable for discharge.   Clinical Impression:  1. Sprain of right knee, unspecified ligament, initial encounter   2. Primary osteoarthritis of right knee      Discharge           Final Clinical Impression(s) / ED Diagnoses Final diagnoses:  Sprain of right knee, unspecified ligament, initial encounter  Primary osteoarthritis of right knee    Rx / DC Orders ED Discharge Orders           Ordered    ibuprofen (ADVIL) 600 MG tablet  Every 8 hours PRN        04/13/23 0804              Tonette Lederer, PA-C 04/13/23 0808    Tonette Lederer, PA-C 04/13/23 4696    Bethann Berkshire, MD 04/13/23 1906

## 2023-04-15 ENCOUNTER — Other Ambulatory Visit: Payer: Self-pay

## 2023-04-15 ENCOUNTER — Other Ambulatory Visit (HOSPITAL_BASED_OUTPATIENT_CLINIC_OR_DEPARTMENT_OTHER): Payer: Self-pay

## 2023-04-17 ENCOUNTER — Ambulatory Visit: Payer: Medicaid Other | Admitting: Nurse Practitioner

## 2023-04-23 ENCOUNTER — Other Ambulatory Visit: Payer: Self-pay

## 2023-04-24 ENCOUNTER — Encounter (HOSPITAL_COMMUNITY): Payer: Self-pay

## 2023-04-24 ENCOUNTER — Other Ambulatory Visit: Payer: Self-pay

## 2023-04-24 ENCOUNTER — Ambulatory Visit (HOSPITAL_COMMUNITY)
Admission: EM | Admit: 2023-04-24 | Discharge: 2023-04-24 | Disposition: A | Discharge status: Home / Self Care | Payer: Medicaid Other | Attending: Emergency Medicine | Admitting: Emergency Medicine

## 2023-04-24 DIAGNOSIS — L02811 Cutaneous abscess of head [any part, except face]: Secondary | ICD-10-CM | POA: Diagnosis not present

## 2023-04-24 MED ORDER — DOXYCYCLINE HYCLATE 100 MG PO TABS
100.0000 mg | ORAL_TABLET | Freq: Two times a day (BID) | ORAL | 0 refills | Status: DC
  Filled 2023-04-24: qty 20, 10d supply, fill #0

## 2023-04-24 NOTE — ED Provider Notes (Signed)
[facility]    CSN: [id number] Arrival date & time: 04/24/23  4098      History   Chief Complaint Chief Complaint  Patient presents with   Abscess    [name] is a 47 y.o. female.   Patient presents to clinic for complaint of an abscess behind her left ear.  It has been ongoing for the past 3 days and she reports her neck is sore.  She denies fevers.  Has been doing a warm compress with topical antibacterial ointment without improvement.  Endorses picking and prodding at the site, denies drainage.    The history is provided by the patient and medical records.  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      Home Medications    Prior to Admission medications   Medication Sig Start Date End Date Taking? Authorizing Provider  doxycycline (VIBRA-TABS) 100 MG tablet Take 1 tablet (100 mg total) by mouth 2 (two) times daily. 04/24/23  Yes Rinaldo Ratel, [place] N, FNP  atorvastatin (LIPITOR) 40 MG tablet Take 1 tablet (40 mg total) by mouth daily. 01/10/22 04/10/22  Claiborne Rigg, NP  gabapentin (NEURONTIN) 300 MG capsule Take 1 capsule (300 mg total) by mouth 3 (three) times daily. 01/08/22   Claiborne Rigg, NP  glipiZIDE (GLUCOTROL) 5 MG tablet Take 1 tablet (5 mg total) by mouth daily before breakfast. 03/15/23   Claiborne Rigg, NP  Insulin Glargine (BASAGLAR KWIKPEN) 100  UNIT/ML Inject 20 Units into the skin daily. 03/15/23   Claiborne Rigg, NP  Insulin Pen Needle (B-D UF III MINI PEN NEEDLES) 31G X 5 MM MISC Use as instructed 01/08/22   Claiborne Rigg, NP  lisinopril (ZESTRIL) 20 MG tablet Take 1 tablet (20 mg total) by mouth daily. 03/15/23   Claiborne Rigg, NP    Family History Family History  Problem Relation Age of Onset   Diabetes Mother    Hypertension Mother    Dementia Mother    Pancreatic cancer Father    Diabetes Father    Hypertension Father    Colon cancer Neg Hx    Colon polyps Neg Hx    Esophageal cancer Neg Hx    Rectal cancer Neg Hx    Stomach cancer Neg Hx     Social History Social History   Tobacco Use   Smoking status: Former    Types: Cigarettes    Quit date: 01/25/2007    Years since quitting: 16.2   Smokeless tobacco: Never  Vaping Use   Vaping Use: Never used  Substance Use Topics   Alcohol use: No   Drug use: No     Allergies   Lobster [shellfish allergy] and Reglan [metoclopramide]   Review  of Systems Review of Systems  Constitutional:  Negative for chills and fever.  HENT:  Negative for sore throat.   Skin:  Positive for wound.     Physical Exam Triage Vital Signs ED Triage Vitals  Enc Vitals Group     BP 04/24/23 1013 128/82     Pulse Rate 04/24/23 1013 84     Resp 04/24/23 1013 16     Temp 04/24/23 1013 97.7 F (36.5 C)     Temp Source 04/24/23 1013 Oral     SpO2 04/24/23 1013 96 %     Weight --      Height --      Head Circumference --      Peak Flow --      Pain Score 04/24/23 1014 8     Pain Loc --      Pain Edu? --      Excl. in GC? --    No data found.  Updated Vital Signs BP 128/82 (BP Location: Right Arm)   Pulse 84   Temp 97.7 F (36.5 C) (Oral)   Resp 16   SpO2 96%   Visual Acuity Right Eye Distance:   Left Eye Distance:   Bilateral Distance:    Right Eye Near:   Left Eye Near:    Bilateral Near:     Physical Exam Vitals and nursing note reviewed.   Constitutional:      Appearance: Normal appearance.  HENT:     Head: Normocephalic and atraumatic.     Right Ear: External ear normal.     Left Ear: External ear normal.     Nose: Nose normal.     Mouth/Throat:     Mouth: Mucous membranes are moist.  Eyes:     Conjunctiva/sclera: Conjunctivae normal.  Cardiovascular:     Rate and Rhythm: Normal rate.     Pulses: Normal pulses.  Pulmonary:     Effort: Pulmonary effort is normal. No respiratory distress.  Musculoskeletal:        General: No swelling. Normal range of motion.  Skin:    General: Skin is warm and dry.     Findings: Abscess present.     Comments: 3cm x 2 cm abscess that is tender and indurated w/o fluctuance to left ear / temporal area.   Neurological:     Mental Status: She is alert.  Psychiatric:        Behavior: Behavior is cooperative.      UC Treatments / Results  Labs (all labs ordered are listed, but only abnormal results are displayed) Labs Reviewed - No data to display  EKG   Radiology No results found.  Procedures Procedures (including critical care time)  Medications Ordered in UC Medications - No data to display  Initial Impression / Assessment and Plan / UC Course  I have reviewed the triage vital signs and the nursing notes.  Pertinent labs & imaging results that were available during my care of the patient were reviewed by me and considered in my medical decision making (see chart for details).  Vitals and triage reviewed, patient is hemodynamically stable.  Has a tender abscess with induration to left ear/temporal area.  Without cervical  LAD, fever or tachycardia, low concerns for systemic illness.  Will cover with doxycycline.  Wound care with warm compresses and antibacterial solution discussed.  Return to clinic in 72 hours for reevaluation if indicated.  Plan of care, follow-up care and return precautions discussed, no questions  at this time.    Final Clinical Impressions(s) / UC  Diagnoses   Final diagnoses:  Abscess of head, except face     Discharge Instructions      You appear to have a superficial abscess behind her left ear.  Please take all antibiotics as prescribed and until finished, you can take them with food to prevent gastrointestinal upset.  This can make you more prone to sunburns, please for skin protection when outside.  You can alternate between Tylenol and ibuprofen every 4-6 hours for pain and discomfort. Please do warm compress 3x daily for 10-15 min with dial antibacterial soap, dove sensitive skin, or an antibacterial solution like Hibiclens. If no improvement over the next 3 days, return to the clinic for re-evaluation.      ED Prescriptions     Medication Sig Dispense Auth. Provider   doxycycline (VIBRA-TABS) 100 MG tablet Take 1 tablet (100 mg total) by mouth 2 (two) times daily. 20 tablet Phillipa Morden, [place] N, [place]      PDMP not reviewed this encounter.   Kissa Campoy, [place] N, [place] 04/24/23 1029

## 2023-04-24 NOTE — Discharge Instructions (Addendum)
You appear to have a superficial abscess behind her left ear.  Please take all antibiotics as prescribed and until finished, you can take them with food to prevent gastrointestinal upset.  This can make you more prone to sunburns, please for skin protection when outside.  You can alternate between Tylenol and ibuprofen every 4-6 hours for pain and discomfort. Please do warm compress 3x daily for 10-15 min with dial antibacterial soap, dove sensitive skin, or an antibacterial solution like Hibiclens. If no improvement over the next 3 days, return to the clinic for re-evaluation.

## 2023-04-24 NOTE — ED Triage Notes (Signed)
Pt c/o abscess/lesion behind left ear onset ~ 3 days ago pt states causing LROM. Says "I have been picking at it." Denies drainage.

## 2023-05-25 ENCOUNTER — Other Ambulatory Visit: Payer: Self-pay

## 2023-05-25 ENCOUNTER — Emergency Department (HOSPITAL_COMMUNITY)
Admission: EM | Admit: 2023-05-25 | Discharge: 2023-05-25 | Disposition: A | Discharge status: Home / Self Care | Payer: 59 | Attending: Emergency Medicine | Admitting: Emergency Medicine

## 2023-05-25 DIAGNOSIS — M62830 Muscle spasm of back: Secondary | ICD-10-CM | POA: Insufficient documentation

## 2023-05-25 DIAGNOSIS — M549 Dorsalgia, unspecified: Secondary | ICD-10-CM | POA: Diagnosis present

## 2023-05-25 MED ORDER — ETODOLAC 400 MG PO TABS: 400.0000 mg | ORAL_TABLET | Freq: Two times a day (BID) | ORAL | 0 refills | Status: DC

## 2023-05-25 MED ORDER — LIDOCAINE 5 % EX PTCH: 1.0000 | MEDICATED_PATCH | CUTANEOUS | 0 refills | Status: DC

## 2023-05-25 MED ORDER — HYDROCODONE-ACETAMINOPHEN 5-325 MG PO TABS
1.0000 | ORAL_TABLET | Freq: Once | ORAL | Status: AC
  Administered 2023-05-25: 1 via ORAL
  Filled 2023-05-25: qty 1

## 2023-05-25 MED ORDER — METHOCARBAMOL 500 MG PO TABS: 500.0000 mg | ORAL_TABLET | Freq: Two times a day (BID) | ORAL | 0 refills | Status: DC

## 2023-05-25 MED ORDER — ONDANSETRON 8 MG PO TBDP
8.0000 mg | ORAL_TABLET | Freq: Once | ORAL | Status: AC
  Administered 2023-05-25: 8 mg via ORAL
  Filled 2023-05-25: qty 1

## 2023-05-25 MED ORDER — KETOROLAC TROMETHAMINE 15 MG/ML IJ SOLN
15.0000 mg | Freq: Once | INTRAMUSCULAR | Status: AC
  Administered 2023-05-25: 15 mg via INTRAMUSCULAR
  Filled 2023-05-25: qty 1

## 2023-05-25 MED ORDER — LIDOCAINE 5 % EX PTCH
1.0000 | MEDICATED_PATCH | CUTANEOUS | Status: DC
  Administered 2023-05-25: 1 via TRANSDERMAL
  Filled 2023-05-25: qty 1

## 2023-05-25 MED ORDER — DIAZEPAM 2 MG PO TABS
2.0000 mg | ORAL_TABLET | Freq: Once | ORAL | Status: AC
  Administered 2023-05-25: 2 mg via ORAL
  Filled 2023-05-25: qty 1

## 2023-05-25 NOTE — Discharge Instructions (Signed)
Your back pain is most likely due to a muscular spasm.   Please follow-up with your family physician.  Please return to the emergency department for new numbness or weakness to your arms or legs. Difficulty with urinating or urinating or pooping on yourself.  Also if you cannot feel toilet paper when you wipe or get a fever.   I have prescribed you Robaxin.  This is a muscle relaxer.  Able make you drowsy.  Do not drive or do anything else that is dangerous after taking this medication.  Lodine is a anti-inflammatory medication.  Do not combine this with other anti-inflammatory such as ibuprofen, or Aleve.

## 2023-05-25 NOTE — ED Triage Notes (Signed)
Pt arrived via POV. C/o thoracic and lumbar back pain for 3x days. No associated injury. No numbness or tingling in lower extremities, no bladder issues.

## 2023-05-25 NOTE — ED Provider Notes (Signed)
[place] EMERGENCY DEPARTMENT AT [facility] Provider Note   CSN: [id number] Arrival date & time: 05/25/23  0725     History  Chief Complaint  Patient presents with   Back Pain    Wendy Olson is a 47 y.o. female.  47 year old female presents today for evaluation of back pain that has been ongoing for the past days.  She states she woke up with this pain.  There is no associated bowel or bladder dysfunction, fever, saddle anesthesia, Muscle Weakness.  No History of HIV, or IV Drug Use.  No History of Malignancy.   The history is provided by the patient. No language interpreter was used.       Home Medications Prior to Admission medications   Medication Sig Start Date End Date Taking? Authorizing Provider  atorvastatin (LIPITOR) 40 MG tablet Take 1 tablet (40 mg total) by mouth daily. 01/10/22 04/10/22  Claiborne Rigg, NP  doxycycline (VIBRA-TABS) 100 MG tablet Take 1 tablet (100 mg total) by mouth 2 (two) times daily. 04/24/23   Garrison, [place] N, FNP  gabapentin (NEURONTIN) 300 MG capsule Take 1 capsule (300 mg total) by mouth 3 (three) times daily. 01/08/22   Claiborne Rigg, NP  glipiZIDE (GLUCOTROL) 5 MG tablet Take 1 tablet (5 mg total) by mouth daily before breakfast. 03/15/23   Claiborne Rigg, NP  Insulin Glargine (BASAGLAR KWIKPEN) 100 UNIT/ML Inject 20 Units into the skin daily. 03/15/23   Claiborne Rigg, NP  Insulin Pen Needle (B-D UF III MINI PEN NEEDLES) 31G X 5 MM MISC Use as instructed 01/08/22   Claiborne Rigg, NP  lisinopril (ZESTRIL) 20 MG tablet Take 1 tablet (20 mg total) by mouth daily. 03/15/23   Claiborne Rigg, NP      Allergies    Lobster [shellfish allergy] and Reglan [metoclopramide]    Review of Systems   Review of Systems  Constitutional:  Negative for fever.  Gastrointestinal:  Negative for abdominal pain, nausea and vomiting.  Genitourinary:  Negative for dysuria and flank pain.  Musculoskeletal:  Positive for back pain.  Negative for gait problem.  All other systems reviewed and are negative.   Physical Exam Updated Vital Signs BP 132/78 (BP Location: Right Arm)   Pulse 88   Temp 98.3 F (36.8 C) (Oral)   Resp 16   Ht 5\' 6"  (1.676 m)   Wt 99.8 kg   SpO2 99%   BMI 35.51 kg/m  Physical Exam Vitals and nursing note reviewed.  Constitutional:      General: She is not in acute distress.    Appearance: Normal appearance. She is not ill-appearing.  HENT:     Head: Normocephalic and atraumatic.     Nose: Nose normal.  Eyes:     Conjunctiva/sclera: Conjunctivae normal.  Cardiovascular:     Rate and Rhythm: Normal rate and regular rhythm.  Pulmonary:     Effort: Pulmonary effort is normal. No respiratory distress.  Musculoskeletal:        General: No deformity. Normal range of motion.     Comments: Minutes palpation over thoracic spine.  Thoracic paraspinal muscles no swelling with some tightness and tenderness palpation.  Neurologic function.  Cervical and lumbar spine without tenderness to palpation.  5/5 strength in bilateral lower extremities with good range of motion in all major joints.  Skin:    Findings: No rash.  Neurological:     Mental Status: She is alert.  ED Results / Procedures / Treatments   Labs (all labs ordered are listed, but only abnormal results are displayed) Labs Reviewed - No data to display  EKG None  Radiology No results found.  Procedures Procedures    Medications Ordered in ED Medications - No data to display  ED Course/ Medical Decision Making/ A&P                             Medical Decision Making  47 year old female presents today for mid back pain.  Started after she woke up 3 days ago.  Reports that it is tightness that limits her range of motion.  There is no history of traumatic injury, no history of malignancy, fevers, saddle anesthesia, bowel or bladder dysfunction.  Clinical history is consistent with muscle spasm.  Will give multimodal  pain control in the emergency department.  Will discharge with Robaxin, Lodine, lidocaine patch.  Strict return precaution discussed.  Patient voices understanding and is in agreement with plan. No red flag signs or symptoms concerning for cauda equina syndrome or spinal epidural abscess.   Final Clinical Impression(s) / ED Diagnoses Final diagnoses:  Muscle spasm of back    Rx / DC Orders ED Discharge Orders          Ordered    methocarbamol (ROBAXIN) 500 MG tablet  2 times daily        05/25/23 1109    etodolac (LODINE) 400 MG tablet  2 times daily        05/25/23 1109    lidocaine (LIDODERM) 5 %  Every 24 hours        05/25/23 1109              Marita Kansas, PA-C 05/25/23 1111    Derwood Kaplan, MD 05/26/23 [phone]

## 2023-06-27 ENCOUNTER — Other Ambulatory Visit: Payer: Self-pay

## 2023-08-14 ENCOUNTER — Other Ambulatory Visit: Payer: Self-pay | Admitting: Nurse Practitioner

## 2023-08-14 DIAGNOSIS — E1165 Type 2 diabetes mellitus with hyperglycemia: Secondary | ICD-10-CM

## 2023-08-28 ENCOUNTER — Other Ambulatory Visit: Payer: Self-pay | Admitting: Nurse Practitioner

## 2023-08-28 DIAGNOSIS — E1165 Type 2 diabetes mellitus with hyperglycemia: Secondary | ICD-10-CM

## 2023-08-28 NOTE — Telephone Encounter (Signed)
Requested medication (s) are due for refill today: yes  Requested medication (s) are on the active medication list: yes  Last refill:  03/14/22 #30  Future visit scheduled: yes  Notes to clinic:  note attached to previous reorder: "Must keep upcoming office visit for refills. She keeps canceling and rescheduling."    Requested Prescriptions  Pending Prescriptions Disp Refills   glipiZIDE (GLUCOTROL) 5 MG tablet 30 tablet 0    Sig: Take 1 tablet (5 mg total) by mouth daily before breakfast.     Endocrinology:  Diabetes - Sulfonylureas Failed - 08/28/2023  1:27 AM      Failed - HBA1C is between 0 and 7.9 and within 180 days    Hemoglobin A1C  Date Value Ref Range Status  01/08/2022 9.2 (A) 4.0 - 5.6 % Final   Hgb A1c MFr Bld  Date Value Ref Range Status  02/05/2020 10.9 (H) 4.8 - 5.6 % Final    Comment:             Prediabetes: 5.7 - 6.4          Diabetes: >6.4          Glycemic control for adults with diabetes: <7.0          Failed - Valid encounter within last 6 months    Recent Outpatient Visits           1 year ago Type 2 diabetes mellitus with hyperglycemia, with long-term current use of insulin [facility])   [place] [facility] [place], [place] W, NP   2 years ago Type 2 diabetes mellitus with hyperglycemia, without long-term current use of insulin [facility])   [facility], Gildardo Pounds, NP   3 years ago Encounter for Papanicolaou smear for cervical cancer screening   [place] [facility] [place], [place] W, NP   3 years ago Type 2 diabetes mellitus with diabetic polyneuropathy, with long-term current use of insulin [facility])   [place] [facility] [place], [place] W, NP   3 years ago Type 2 diabetes mellitus with diabetic polyneuropathy, with long-term current use of insulin [facility])   [place] [facility] [place], Shea Stakes, NP       Future  Appointments             In 3 weeks Anders Simmonds, [place]  [facility]            Passed - Cr in normal range and within 360 days    Creatinine, Ser  Date Value Ref Range Status  01/06/2023 0.58 0.44 - 1.00 mg/dL Final   Creatinine, POC  Date Value Ref Range Status  12/12/2017 200mg /dl mg/dL Final

## 2023-09-01 ENCOUNTER — Other Ambulatory Visit: Payer: Self-pay | Admitting: Nurse Practitioner

## 2023-09-01 DIAGNOSIS — E1165 Type 2 diabetes mellitus with hyperglycemia: Secondary | ICD-10-CM

## 2023-09-02 NOTE — Telephone Encounter (Signed)
Requested medication (s) are due for refill today: yes  Requested medication (s) are on the active medication list: yes  Last refill:  03/15/23  Future visit scheduled: yes  Notes to clinic:  Unable to refill per protocol, courtesy refill already given, routing for provider approval.      Requested Prescriptions  Pending Prescriptions Disp Refills   Insulin Glargine (BASAGLAR KWIKPEN) 100 UNIT/ML 6 mL 0    Sig: Inject 20 Units into the skin daily.     Endocrinology:  Diabetes - Insulins Failed - 09/01/2023  9:18 PM      Failed - HBA1C is between 0 and 7.9 and within 180 days    Hemoglobin A1C  Date Value Ref Range Status  01/08/2022 9.2 (A) 4.0 - 5.6 % Final   Hgb A1c MFr Bld  Date Value Ref Range Status  02/05/2020 10.9 (H) 4.8 - 5.6 % Final    Comment:             Prediabetes: 5.7 - 6.4          Diabetes: >6.4          Glycemic control for adults with diabetes: <7.0          Failed - Valid encounter within last 6 months    Recent Outpatient Visits           1 year ago Type 2 diabetes mellitus with hyperglycemia, with long-term current use of insulin (HCC)   [place] [facility] [place], [place] W, NP   2 years ago Type 2 diabetes mellitus with hyperglycemia, without long-term current use of insulin (HCC)   [place] [facility], Gildardo Pounds, NP   3 years ago Encounter for Papanicolaou smear for cervical cancer screening   [place] [facility] [place], [place] W, NP   3 years ago Type 2 diabetes mellitus with diabetic polyneuropathy, with long-term current use of insulin (HCC)   [place] [facility] [place], [place] W, NP   3 years ago Type 2 diabetes mellitus with diabetic polyneuropathy, with long-term current use of insulin (HCC)   [place] [facility] [place], [place], NP       Future Appointments             In 2 weeks [place], Marzella Schlein,  PA-C  [facility]             glipiZIDE (GLUCOTROL) 5 MG tablet 30 tablet 0    Sig: Take 1 tablet (5 mg total) by mouth daily before breakfast.     Endocrinology:  Diabetes - Sulfonylureas Failed - 09/01/2023  9:18 PM      Failed - HBA1C is between 0 and 7.9 and within 180 days    Hemoglobin A1C  Date Value Ref Range Status  01/08/2022 9.2 (A) 4.0 - 5.6 % Final   Hgb A1c MFr Bld  Date Value Ref Range Status  02/05/2020 10.9 (H) 4.8 - 5.6 % Final    Comment:             Prediabetes: 5.7 - 6.4          Diabetes: >6.4          Glycemic control for adults with diabetes: <7.0          Failed - Valid encounter within last 6 months    Recent Outpatient Visits  1 year ago Type 2 diabetes mellitus with hyperglycemia, with long-term current use of insulin [facility])   [place] [facility] [place], [place] W, NP   2 years ago Type 2 diabetes mellitus with hyperglycemia, without long-term current use of insulin [facility])   [place] [facility], Gildardo Pounds, NP   3 years ago Encounter for Papanicolaou smear for cervical cancer screening   [place] [facility] [place], [place] W, NP   3 years ago Type 2 diabetes mellitus with diabetic polyneuropathy, with long-term current use of insulin [facility])   [place] [facility] [place], [place] W, NP   3 years ago Type 2 diabetes mellitus with diabetic polyneuropathy, with long-term current use of insulin [facility])   [place] [facility] [place], Shea Stakes, NP       Future Appointments             In 2 weeks [place] [facility]            Passed - Cr in normal range and within 360 days    Creatinine, Ser  Date Value Ref Range Status  01/06/2023 0.58 0.44 - 1.00 mg/dL Final   Creatinine, POC  Date Value Ref Range Status  12/12/2017  200mg /dl mg/dL Final

## 2023-09-03 ENCOUNTER — Other Ambulatory Visit: Payer: Self-pay | Admitting: Nurse Practitioner

## 2023-09-03 ENCOUNTER — Encounter: Payer: Self-pay | Admitting: Nurse Practitioner

## 2023-09-03 DIAGNOSIS — I10 Essential (primary) hypertension: Secondary | ICD-10-CM

## 2023-09-13 ENCOUNTER — Emergency Department (HOSPITAL_COMMUNITY): Payer: 59

## 2023-09-13 ENCOUNTER — Other Ambulatory Visit: Payer: Self-pay

## 2023-09-13 ENCOUNTER — Encounter (HOSPITAL_COMMUNITY): Payer: Self-pay | Admitting: Emergency Medicine

## 2023-09-13 ENCOUNTER — Emergency Department (HOSPITAL_COMMUNITY)
Admission: EM | Admit: 2023-09-13 | Discharge: 2023-09-13 | Disposition: A | Discharge status: Home / Self Care | Payer: 59 | Attending: Emergency Medicine | Admitting: Emergency Medicine

## 2023-09-13 DIAGNOSIS — Z7984 Long term (current) use of oral hypoglycemic drugs: Secondary | ICD-10-CM | POA: Insufficient documentation

## 2023-09-13 DIAGNOSIS — Z794 Long term (current) use of insulin: Secondary | ICD-10-CM | POA: Diagnosis not present

## 2023-09-13 DIAGNOSIS — M545 Low back pain, unspecified: Secondary | ICD-10-CM | POA: Diagnosis not present

## 2023-09-13 DIAGNOSIS — R519 Headache, unspecified: Secondary | ICD-10-CM | POA: Insufficient documentation

## 2023-09-13 DIAGNOSIS — J45909 Unspecified asthma, uncomplicated: Secondary | ICD-10-CM | POA: Insufficient documentation

## 2023-09-13 DIAGNOSIS — I10 Essential (primary) hypertension: Secondary | ICD-10-CM | POA: Diagnosis not present

## 2023-09-13 DIAGNOSIS — E1165 Type 2 diabetes mellitus with hyperglycemia: Secondary | ICD-10-CM | POA: Diagnosis not present

## 2023-09-13 DIAGNOSIS — R739 Hyperglycemia, unspecified: Secondary | ICD-10-CM | POA: Diagnosis present

## 2023-09-13 DIAGNOSIS — Z79899 Other long term (current) drug therapy: Secondary | ICD-10-CM | POA: Diagnosis not present

## 2023-09-13 DIAGNOSIS — H538 Other visual disturbances: Secondary | ICD-10-CM

## 2023-09-13 LAB — URINALYSIS, ROUTINE W REFLEX MICROSCOPIC
Bacteria, UA: NONE SEEN
Bilirubin Urine: NEGATIVE
Glucose, UA: >=500 mg/dL — AB
Ketones, ur: NEGATIVE mg/dL
Nitrite: NEGATIVE
Protein, ur: NEGATIVE mg/dL
Specific Gravity, Urine: 1.028 (ref 1.005–1.030)
pH: 5 (ref 5.0–8.0)

## 2023-09-13 LAB — I-STAT CHEM 8, ED
BUN: 15 mg/dL (ref 6–20)
Calcium, Ion: 1.16 mmol/L (ref 1.15–1.40)
Chloride: 102 mmol/L (ref 98–111)
Creatinine, Ser: 0.6 mg/dL (ref 0.44–1.00)
Glucose, Bld: 479 mg/dL — ABNORMAL HIGH (ref 70–99)
HCT: 45 % (ref 36.0–46.0)
Hemoglobin: 15.3 g/dL — ABNORMAL HIGH (ref 12.0–15.0)
Potassium: 4 mmol/L (ref 3.5–5.1)
Sodium: 134 mmol/L — ABNORMAL LOW (ref 135–145)
TCO2: 21 mmol/L — ABNORMAL LOW (ref 22–32)

## 2023-09-13 LAB — CBG MONITORING, ED
Glucose-Capillary: 425 mg/dL — ABNORMAL HIGH (ref 70–99)
Glucose-Capillary: 324 mg/dL — ABNORMAL HIGH (ref 70–99)
Glucose-Capillary: 270 mg/dL — ABNORMAL HIGH (ref 70–99)

## 2023-09-13 LAB — HCG, SERUM, QUALITATIVE: Preg, Serum: NEGATIVE

## 2023-09-13 LAB — BASIC METABOLIC PANEL
Anion gap: 10 (ref 5–15)
BUN: 16 mg/dL (ref 6–20)
CO2: 21 mmol/L — ABNORMAL LOW (ref 22–32)
Calcium: 9 mg/dL (ref 8.9–10.3)
Chloride: 100 mmol/L (ref 98–111)
Creatinine, Ser: 0.72 mg/dL (ref 0.44–1.00)
GFR, Estimated: >60 mL/min (ref >60)
Glucose, Bld: 451 mg/dL — ABNORMAL HIGH (ref 70–99)
Potassium: 4 mmol/L (ref 3.5–5.1)
Sodium: 131 mmol/L — ABNORMAL LOW (ref 135–145)

## 2023-09-13 LAB — CBC
HCT: 44.5 % (ref 36.0–46.0)
Hemoglobin: 15.4 g/dL — ABNORMAL HIGH (ref 12.0–15.0)
MCH: 30.9 pg (ref 26.0–34.0)
MCHC: 34.6 g/dL (ref 30.0–36.0)
MCV: 89.4 fL (ref 80.0–100.0)
Platelets: 292 10*3/uL (ref 150–400)
RBC: 4.98 MIL/uL (ref 3.87–5.11)
RDW: 11.9 % (ref 11.5–15.5)
WBC: 7.7 10*3/uL (ref 4.0–10.5)
nRBC: 0 % (ref 0.0–0.2)

## 2023-09-13 MED ORDER — LORAZEPAM 2 MG/ML IJ SOLN
0.5000 mg | Freq: Once | INTRAMUSCULAR | Status: AC | PRN
  Administered 2023-09-13: 0.5 mg via INTRAVENOUS
  Filled 2023-09-13: qty 1

## 2023-09-13 MED ORDER — BASAGLAR KWIKPEN 100 UNIT/ML ~~LOC~~ SOPN
20.0000 [IU] | PEN_INJECTOR | Freq: Every day | SUBCUTANEOUS | 0 refills | Status: DC
  Filled 2023-09-13: qty 6, 30d supply, fill #0

## 2023-09-13 MED ORDER — INSULIN ASPART 100 UNIT/ML IJ SOLN
8.0000 [IU] | Freq: Once | INTRAMUSCULAR | Status: AC
  Administered 2023-09-13: 8 [IU] via INTRAVENOUS
  Filled 2023-09-13: qty 0.08

## 2023-09-13 MED ORDER — GLIPIZIDE 5 MG PO TABS
5.0000 mg | ORAL_TABLET | Freq: Every day | ORAL | 0 refills | Status: DC
Start: 2023-09-13 — End: 2023-10-28
  Filled 2023-09-13: qty 30, 30d supply, fill #0

## 2023-09-13 NOTE — ED Provider Notes (Signed)
[place] EMERGENCY DEPARTMENT AT [facility] Provider Note   CSN: [id number] Arrival date & time: 09/13/23  0422     History  Chief Complaint  Patient presents with   Hyperglycemia    Wendy Olson is a 47 y.o. female.  HPI      Out of glipizide for the past month [name] she could not get anymore until she saw her in person on the 10/24 Sugar was 500s at home  Feeling "ick" vision blurry all over, headache, increased thirst and increased urination for a few days . Trying to drink water, stay away from high carbs.    No fever, cough, nausea, vomiting, diarrhea, numbness, weakness No difficulty walking or talking Vision improved with glucose decreasing in ED Headache 5/10 now, was 6-7/10 before  Left leg weakness on exam----reports has noticed this for the last 2 days, sort of waxing and waning over last 2 days. Has some back pain, did yesterday. No loss control bowel or bladder.  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 Authorizing Provider  atorvastatin (LIPITOR) 40 MG tablet Take 1 tablet (40 mg total) by mouth daily. 01/10/22 04/10/22  Claiborne Rigg, NP  doxycycline (VIBRA-TABS) 100 MG tablet Take 1 tablet (100 mg total) by mouth 2 (two) times daily. 04/24/23   Garrison, [place] N, FNP  etodolac (LODINE) 400 MG tablet Take 1 tablet (400 mg total) by mouth 2 (two) times daily. 05/25/23   Karie Mainland, Amjad, PA-C  gabapentin (NEURONTIN) 300 MG capsule Take 1 capsule (300 mg total) by mouth 3 (three) times daily. 01/08/22   Claiborne Rigg, NP  glipiZIDE (GLUCOTROL) 5 MG tablet Take 1 tablet (5 mg total) by mouth daily before breakfast. 09/13/23   [name],  MD  Insulin Glargine (BASAGLAR KWIKPEN) 100 UNIT/ML Inject 20 Units into the skin daily. 09/13/23   [name], MD  Insulin Pen Needle (B-D UF III MINI PEN NEEDLES) 31G X 5 MM MISC Use as instructed 01/08/22   Claiborne Rigg, NP  lidocaine (LIDODERM) 5 % Place 1 patch onto the skin daily. Remove & Discard patch within 12 hours or as directed by MD 05/25/23   Marita Kansas, PA-C  lisinopril (ZESTRIL) 20 MG tablet Take 1 tablet (20 mg total) by mouth daily. 03/15/23   Claiborne Rigg, NP  methocarbamol (ROBAXIN) 500 MG tablet Take 1 tablet (500 mg total) by mouth 2 (two) times daily. 05/25/23   Marita Kansas, PA-C      Allergies    Lobster [shellfish allergy] and Reglan [metoclopramide]    Review of Systems   Review of Systems  Physical Exam Updated Vital Signs BP (!) 145/84   Pulse 98   Temp 98.3 F (36.8 C) (Oral)   Resp 18   Ht 5\' 7"  (1.702 m)   Wt 98.9 kg   SpO2 99%   BMI 34.14 kg/m  Physical Exam Vitals and nursing note reviewed.  Constitutional:      General: She is not in acute distress.    Appearance: She is well-developed. She is not  diaphoretic.  HENT:     Head: Normocephalic and atraumatic.  Eyes:     General: Visual field deficit: is able to visualize cues in periphery, does state different numbers at times.     Conjunctiva/sclera: Conjunctivae normal.  Cardiovascular:     Rate and Rhythm: Normal rate and regular rhythm.     Heart sounds: Normal heart sounds. No murmur heard.    No friction rub. No gallop.  Pulmonary:     Effort: Pulmonary effort is normal. No respiratory distress.     Breath sounds: Normal breath sounds. No wheezing or rales.  Abdominal:     General: There is no distension.     Palpations: Abdomen is soft.     Tenderness: There is no abdominal tenderness. There is no guarding.  Musculoskeletal:        General: No tenderness.     Cervical back: Normal range of motion.  Skin:    General: Skin is warm and dry.     Findings: No erythema or  rash.  Neurological:     Mental Status: She is alert and oriented to person, place, and time.     GCS: GCS eye subscore is 4. GCS verbal subscore is 5. GCS motor subscore is 6.     Cranial Nerves: No cranial nerve deficit, dysarthria or facial asymmetry.     Motor: Weakness (very mild weakness LLE) present. No pronator drift.     ED Results / Procedures / Treatments   Labs (all labs ordered are listed, but only abnormal results are displayed) Labs Reviewed  CBC - Abnormal; Notable for the following components:      Result Value   Hemoglobin 15.4 (*)    All other components within normal limits  URINALYSIS, ROUTINE W REFLEX MICROSCOPIC - Abnormal; Notable for the following components:   Color, Urine COLORLESS (*)    Glucose, UA >=500 (*)    Hgb urine dipstick SMALL (*)    Leukocytes,Ua TRACE (*)    All other components within normal limits  BASIC METABOLIC PANEL - Abnormal; Notable for the following components:   Sodium 131 (*)    CO2 21 (*)    Glucose, Bld 451 (*)    All other components within normal limits  CBG MONITORING, ED - Abnormal; Notable for the following components:   Glucose-Capillary 425 (*)    All other components within normal limits  I-STAT CHEM 8, ED - Abnormal; Notable for the following components:   Sodium 134 (*)    Glucose, Bld 479 (*)    TCO2 21 (*)    Hemoglobin 15.3 (*)    All other components within normal limits  CBG MONITORING, ED - Abnormal; Notable for the following components:   Glucose-Capillary 324 (*)    All other components within normal limits  CBG MONITORING, ED - Abnormal; Notable for the following components:   Glucose-Capillary 270 (*)    All other components within normal limits  HCG, SERUM, QUALITATIVE    EKG None  Radiology MR BRAIN WO CONTRAST  Result Date: 09/13/2023 CLINICAL DATA:  Neuro deficit with acute stroke suspected leg weakness and blurred vision. EXAM: MRI HEAD WITHOUT CONTRAST TECHNIQUE: Multiplanar, multiecho  pulse sequences of the brain and surrounding structures were obtained without intravenous contrast. COMPARISON:  Head CT from earlier today FINDINGS: Brain: No acute infarction, hemorrhage, hydrocephalus, extra-axial collection or mass lesion. No brain atrophy or white matter disease. No clear changes of nonketotic hyperglycemic seizure. Vascular: Normal flow voids. Skull  and upper cervical spine: Normal marrow signal. Sinuses/Orbits: Negative. IMPRESSION: Normal brain MRI. Electronically Signed   By: Tiburcio Pea M.D.   On: 09/13/2023 12:36   CT Head Wo Contrast  Result Date: 09/13/2023 CLINICAL DATA:  Hyperglycemia. Neuro deficit with acute stroke suspected EXAM: CT HEAD WITHOUT CONTRAST TECHNIQUE: Contiguous axial images were obtained from the base of the skull through the vertex without intravenous contrast. RADIATION DOSE REDUCTION: This exam was performed according to the departmental dose-optimization program which includes automated exposure control, adjustment of the mA and/or kV according to patient size and/or use of iterative reconstruction technique. COMPARISON:  08/13/2019 FINDINGS: Brain: No evidence of acute infarction, hemorrhage, hydrocephalus, extra-axial collection or mass lesion/mass effect. Vascular: No hyperdense vessel or unexpected calcification. Skull: Normal. Negative for fracture or focal lesion. Sinuses/Orbits: No acute finding. IMPRESSION: Normal head CT. Electronically Signed   By: Tiburcio Pea M.D.   On: 09/13/2023 12:30   MR LUMBAR SPINE WO CONTRAST  Result Date: 09/13/2023 CLINICAL DATA:  Low back pain, cauda equina syndrome suspected low back pain, left leg weakness. EXAM: MRI LUMBAR SPINE WITHOUT CONTRAST TECHNIQUE: Multiplanar, multisequence MR imaging of the lumbar spine was performed. No intravenous contrast was administered. COMPARISON:  None Available. FINDINGS: Segmentation:  Standard. Alignment:  Physiologic. Vertebrae:  No fracture, evidence of discitis, or  bone lesion. Conus medullaris and cauda equina: Conus extends to the L2 level. Conus and cauda equina appear normal. Paraspinal and other soft tissues: Negative. Disc levels: T12-L1: Unremarkable L1-L2: Unremarkable L2-L3: Unremarkable L3-L4: Unremarkable L4-L5: Mild bilateral facet degenerative change. No spinal canal narrowing. No neural foraminal narrowing. No significant disc bulge. L5-S1: Mild bilateral facet degenerative change. No significant disc bulge. No spinal narrowing. Mild bilateral neural foraminal narrowing. IMPRESSION: 1. No acute abnormality of the lumbar spine. 2. Mild bilateral neural foraminal narrowing at L5-S1. No spinal canal narrowing. Electronically Signed   By: Lorenza Cambridge M.D.   On: 09/13/2023 11:59    Procedures Procedures    Medications Ordered in ED Medications  insulin aspart (novoLOG) injection 8 Units (8 Units Intravenous Given 09/13/23 0827)  LORazepam (ATIVAN) injection 0.5 mg (0.5 mg Intravenous Given 09/13/23 1106)    ED Course/ Medical Decision Making/ A&P                                  46yo female with history of DM, htn, hlpd, who presents with concern for hyperglycemia in the setting of not having her glipizide, increased thirst, polyuria, blurred vision, headache , back pain, and found to have mild left leg weakness.   DDx includes ICH, CVA, hyperglycemia, HHS, DKA.  CT head completed and evaluated by me without signs of ICH.  Labs completed and evaluated by me show hyperglycemia, no leukocytosis, no UTI.  No signs of DKA, no sighs of HHS.    Given blurred vision, HA, left leg weakness, MR brain completed and shows no evidence of CVA or other acute abnormalities.   GIven left leg weakness, lower back pain, MR lumbar spine completed and shows mild neural foraminal narrowing.    Suspect symptoms secondary to hyperglycemia. Will re-initiate gipizide, also give rx for long acting insulin. Patient discharged in stable condition with understanding  of reasons to return.         Final Clinical Impression(s) / ED Diagnoses Final diagnoses:  Hyperglycemia  Blurred vision  Acute midline low back pain without sciatica  Rx / DC Orders ED Discharge Orders          Ordered    glipiZIDE (GLUCOTROL) 5 MG tablet  Daily before breakfast       Note to Pharmacy: Must keep upcoming office visit for refills. She keeps canceling and rescheduling.   09/13/23 1407    Insulin Glargine (BASAGLAR KWIKPEN) 100 UNIT/ML  Daily       Note to Pharmacy: Must keep upcoming office visit for refills. Has not been seen since 12/2021   09/13/23 1407              [name], MD 09/13/23 2344

## 2023-09-13 NOTE — ED Triage Notes (Signed)
Pt BIB EMS from home. Checked blood sugar 4 hrs ago was over 500. Has not taken her glipizide in 1 month. Experiencing increased thirst and urination, headache and blurred vision. Pain 0/10. NAD  BP 140/98, HR 100, Spo2 98% RA, CBG 339

## 2023-09-15 ENCOUNTER — Other Ambulatory Visit: Payer: Self-pay | Admitting: Nurse Practitioner

## 2023-09-15 DIAGNOSIS — I10 Essential (primary) hypertension: Secondary | ICD-10-CM

## 2023-09-16 NOTE — Telephone Encounter (Signed)
Requested medication (s) are due for refill today:   Yes  Requested medication (s) are on the active medication list:   Yes  Future visit scheduled:   Yes 10/24 with Marylene Land   Last ordered: 03/15/2023 #30, 0 refill  Returned for provider review for refill prior to upcoming appt since courtesy refill was given in April.      Requested Prescriptions  Pending Prescriptions Disp Refills   lisinopril (ZESTRIL) 20 MG tablet 30 tablet 0    Sig: Take 1 tablet (20 mg total) by mouth daily.     Cardiovascular:  ACE Inhibitors Failed - 09/15/2023 12:01 AM      Failed - Last BP in normal range    BP Readings from Last 1 Encounters:  09/13/23 (!) 145/84         Failed - Valid encounter within last 6 months    Recent Outpatient Visits           1 year ago Type 2 diabetes mellitus with hyperglycemia, with long-term current use of insulin [facility])   [place] [facility] [place], [place] W, NP   2 years ago Type 2 diabetes mellitus with hyperglycemia, without long-term current use of insulin [facility])   [place] [facility], Gildardo Pounds, NP   3 years ago Encounter for Papanicolaou smear for cervical cancer screening   [facility] [facility] [place], [place] W, NP   3 years ago Type 2 diabetes mellitus with diabetic polyneuropathy, with long-term current use of insulin [facility])   [place] [facility] [place], [place] W, NP   3 years ago Type 2 diabetes mellitus with diabetic polyneuropathy, with long-term current use of insulin [facility])   [facility] [place], Shea Stakes, NP       Future Appointments             In 3 days [place], [place]  [facility]            Passed - Cr in normal range and within 180 days    Creatinine, Ser  Date Value Ref Range Status  09/13/2023 0.60 0.44 - 1.00 mg/dL Final   Creatinine, POC  Date Value Ref  Range Status  12/12/2017 200mg /dl mg/dL Final         Passed - K in normal range and within 180 days    Potassium  Date Value Ref Range Status  09/13/2023 4.0 3.5 - 5.1 mmol/L Final         Passed - Patient is not pregnant

## 2023-09-16 NOTE — Plan of Care (Signed)
[facility] Tonsillectomy/Adenoidectomy, Postoperative PEDS care plan entered in error.

## 2023-09-18 ENCOUNTER — Other Ambulatory Visit: Payer: Self-pay | Admitting: Nurse Practitioner

## 2023-09-18 DIAGNOSIS — I10 Essential (primary) hypertension: Secondary | ICD-10-CM

## 2023-09-19 ENCOUNTER — Ambulatory Visit: Payer: 59 | Admitting: Physician Assistant

## 2023-09-19 DIAGNOSIS — Z91199 Patient's noncompliance with other medical treatment and regimen due to unspecified reason: Secondary | ICD-10-CM

## 2023-09-19 DIAGNOSIS — Z794 Long term (current) use of insulin: Secondary | ICD-10-CM

## 2023-09-27 ENCOUNTER — Other Ambulatory Visit (HOSPITAL_COMMUNITY): Payer: Self-pay | Admitting: Emergency Medicine

## 2023-09-27 DIAGNOSIS — E1165 Type 2 diabetes mellitus with hyperglycemia: Secondary | ICD-10-CM

## 2023-09-29 ENCOUNTER — Other Ambulatory Visit: Payer: Self-pay | Admitting: Nurse Practitioner

## 2023-09-29 ENCOUNTER — Other Ambulatory Visit: Payer: Self-pay | Admitting: Emergency Medicine

## 2023-09-29 DIAGNOSIS — I10 Essential (primary) hypertension: Secondary | ICD-10-CM

## 2023-09-29 DIAGNOSIS — E1165 Type 2 diabetes mellitus with hyperglycemia: Secondary | ICD-10-CM

## 2023-10-07 ENCOUNTER — Other Ambulatory Visit: Payer: Self-pay

## 2023-10-28 ENCOUNTER — Encounter: Payer: Self-pay | Admitting: Nurse Practitioner

## 2023-10-28 ENCOUNTER — Other Ambulatory Visit: Payer: Self-pay | Admitting: Pharmacist

## 2023-10-28 ENCOUNTER — Ambulatory Visit: Payer: 59 | Attending: Physician Assistant | Admitting: Nurse Practitioner

## 2023-10-28 ENCOUNTER — Other Ambulatory Visit: Payer: Self-pay

## 2023-10-28 VITALS — BP 147/93 | HR 104 | Ht 67.0 in | Wt 223.6 lb

## 2023-10-28 DIAGNOSIS — D72829 Elevated white blood cell count, unspecified: Secondary | ICD-10-CM

## 2023-10-28 DIAGNOSIS — E785 Hyperlipidemia, unspecified: Secondary | ICD-10-CM

## 2023-10-28 DIAGNOSIS — E1165 Type 2 diabetes mellitus with hyperglycemia: Secondary | ICD-10-CM

## 2023-10-28 DIAGNOSIS — Z794 Long term (current) use of insulin: Secondary | ICD-10-CM | POA: Insufficient documentation

## 2023-10-28 DIAGNOSIS — E1142 Type 2 diabetes mellitus with diabetic polyneuropathy: Secondary | ICD-10-CM

## 2023-10-28 DIAGNOSIS — Z23 Encounter for immunization: Secondary | ICD-10-CM | POA: Diagnosis not present

## 2023-10-28 DIAGNOSIS — I10 Essential (primary) hypertension: Secondary | ICD-10-CM

## 2023-10-28 DIAGNOSIS — Z1231 Encounter for screening mammogram for malignant neoplasm of breast: Secondary | ICD-10-CM | POA: Diagnosis not present

## 2023-10-28 MED ORDER — GLIPIZIDE 5 MG PO TABS
5.0000 mg | ORAL_TABLET | Freq: Two times a day (BID) | ORAL | 1 refills | Status: DC
  Filled 2023-10-28: qty 60, 30d supply, fill #0
  Filled 2023-11-12 – 2023-11-30 (×3): qty 60, 30d supply, fill #1

## 2023-10-28 MED ORDER — LANTUS SOLOSTAR 100 UNIT/ML ~~LOC~~ SOPN
20.0000 [IU] | PEN_INJECTOR | Freq: Every day | SUBCUTANEOUS | 2 refills | Status: DC
  Filled 2023-10-28: qty 6, 30d supply, fill #0
  Filled 2023-11-30: qty 6, 30d supply, fill #1

## 2023-10-28 MED ORDER — ACCU-CHEK GUIDE W/DEVICE KIT
1.0000 | PACK | Freq: Once | 0 refills | Status: AC
  Filled 2023-10-28: qty 1, 30d supply, fill #0

## 2023-10-28 MED ORDER — BASAGLAR KWIKPEN 100 UNIT/ML ~~LOC~~ SOPN
20.0000 [IU] | PEN_INJECTOR | Freq: Every day | SUBCUTANEOUS | 1 refills | Status: DC
  Filled 2023-10-28: qty 15, 75d supply, fill #0

## 2023-10-28 MED ORDER — ACCU-CHEK SOFTCLIX LANCETS MISC
6 refills | Status: DC
  Filled 2023-10-28: qty 100, 33d supply, fill #0

## 2023-10-28 MED ORDER — ATORVASTATIN CALCIUM 40 MG PO TABS
40.0000 mg | ORAL_TABLET | Freq: Every day | ORAL | 1 refills | Status: DC
  Filled 2023-10-28: qty 90, 90d supply, fill #0
  Filled 2023-11-12 – 2024-04-25 (×2): qty 90, 90d supply, fill #1

## 2023-10-28 MED ORDER — FREESTYLE LIBRE 14 DAY SENSOR MISC
6 refills | Status: DC
  Filled 2023-10-28: qty 2, 28d supply, fill #0

## 2023-10-28 MED ORDER — FREESTYLE LIBRE 14 DAY READER DEVI
0 refills | Status: DC
  Filled 2023-10-28: qty 1, 30d supply, fill #0

## 2023-10-28 MED ORDER — EMPAGLIFLOZIN 10 MG PO TABS
10.0000 mg | ORAL_TABLET | Freq: Every day | ORAL | 1 refills | Status: DC
  Filled 2023-10-28: qty 90, 90d supply, fill #0

## 2023-10-28 MED ORDER — LISINOPRIL 20 MG PO TABS
20.0000 mg | ORAL_TABLET | Freq: Every day | ORAL | 1 refills | Status: DC
  Filled 2023-10-28: qty 90, 90d supply, fill #0
  Filled 2024-04-25: qty 90, 90d supply, fill #1

## 2023-10-28 MED ORDER — ACCU-CHEK GUIDE TEST VI STRP
ORAL_STRIP | 6 refills | Status: DC
  Filled 2023-10-28: qty 100, 33d supply, fill #0

## 2023-10-28 NOTE — Progress Notes (Signed)
Assessment & Plan:  Wendy Olson was seen today for medical management of chronic issues.  Diagnoses and all orders for this visit:  Type 2 diabetes mellitus with hyperglycemia, with long-term current use of insulin  Dose Change: increased glipizide and lantus. Add Jardiance -     Hemoglobin A1c -     CMP14+EGFR -     Urine Albumin/Creatinine with ratio (send out) [LAB689] -     atorvastatin (LIPITOR) 40 MG tablet; Take 1 tablet (40 mg total) by mouth daily. -     Discontinue: Insulin Glargine (BASAGLAR KWIKPEN) 100 UNIT/ML; Inject 20 Units into the skin daily. -     glipiZIDE (GLUCOTROL) 5 MG tablet; Take 1 tablet (5 mg total) by mouth 2 (two) times daily before a meal. -     Continuous Glucose Receiver (FREESTYLE LIBRE 14 DAY READER) DEVI; Use as instructed. Check blood glucose levels continuously during the day E11.65 Z79.4 -     Continuous Glucose Sensor (FREESTYLE LIBRE 14 DAY SENSOR) MISC; Use as instructed. Check blood glucose levels continuously during the day E11.65 Z79.4 -     empagliflozin (JARDIANCE) 10 MG TABS tablet; Take 1 tablet (10 mg total) by mouth daily before breakfast. -     Ambulatory referral to Ophthalmology -     Blood Glucose Monitoring Suppl (ACCU-CHEK GUIDE) w/Device KIT; 1 each by Does not apply route once for 1 dose. -     Accu-Chek Softclix Lancets lancets; Use as instructed. Check blood glucose level by fingerstick 3x per day.  Breast cancer screening by mammogram -     MM 3D SCREENING MAMMOGRAM BILATERAL BREAST; Future  Dyslipidemia, goal LDL below 70 -     atorvastatin (LIPITOR) 40 MG tablet; Take 1 tablet (40 mg total) by mouth daily. -     Lipid panel  Primary hypertension -     lisinopril (ZESTRIL) 20 MG tablet; Take 1 tablet (20 mg total) by mouth daily. Continue all antihypertensives as prescribed.  Reminded to bring in blood pressure log for follow  up appointment.  RECOMMENDATIONS: DASH/Mediterranean Diets are healthier choices for HTN.     Leukocytosis, unspecified type -     CBC with Differential/Platelet  Encounter for immunization -     Flu vaccine trivalent PF, 6mos and older(Flulaval,Afluria,Fluarix,Fluzone) -     Tdap vaccine greater than or equal to 7yo IM  Type 2 diabetes mellitus with diabetic polyneuropathy, with long-term current use of insulin (HCC) She never picked up the gabapentin that was prescribed for her neuropathy   Patient has been counseled on age-appropriate routine health concerns for screening and prevention. These are reviewed and up-to-date. Referrals have been placed accordingly. Immunizations are up-to-date or declined.    Subjective:   Chief Complaint  Patient presents with   Medical Management of Chronic Issues    Wendy Olson 47 y.o. female presents to office today accompanied by her friend She has been out of her medications for a few weeks now and I have not seen her in the office since last year.   She has a past medical history of Anxiety, Asthma, DM2, Diabetic neuropathy, Hypertension,  HPL, and Seasonal allergies.   HTN Blood pressure is elevated today. Difficult to gauge if this is due to medication nonadherence or true resistant HTN. BP Readings from Last 3 Encounters:  10/28/23 (!) 147/93  09/13/23 (!) 145/84  05/25/23 132/78     DM 2 Diabetes is poorly controlled. She could not tolerate metformin due to  GI issues. Reports blood sugars as high as 200s currently at home. Currently prescribed lantus 15 units daily and glipizide 5 mg daily.  Lab Results  Component Value Date   HGBA1C 9.2 (A) 01/08/2022  LDL not at goal. She is currently prescribed high intensity statin.  Lab Results  Component Value Date   LDLCALC 231 (H) 01/08/2022     Review of Systems  Constitutional:  Negative for fever, malaise/fatigue and weight loss.  HENT: Negative.  Negative for nosebleeds.   Eyes: Negative.  Negative for blurred vision, double vision and photophobia.  Respiratory:  Negative.  Negative for cough and shortness of breath.   Cardiovascular: Negative.  Negative for chest pain, palpitations and leg swelling.  Gastrointestinal: Negative.  Negative for heartburn, nausea and vomiting.  Musculoskeletal: Negative.  Negative for myalgias.  Neurological: Negative.  Negative for dizziness, focal weakness, seizures and headaches.  Psychiatric/Behavioral: Negative.  Negative for suicidal ideas.     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   Outpatient Medications Prior to Visit  Medication Sig Dispense Refill   gabapentin (NEURONTIN) 300 MG capsule Take 1 capsule (300 mg total) by mouth 3 (three) times daily. 90 capsule 3   Insulin Pen Needle (B-D UF III MINI PEN NEEDLES) 31G X 5 MM MISC Use as instructed 200 each 6   lidocaine (LIDODERM) 5 % Place 1 patch onto the skin daily. Remove & Discard patch within 12 hours or as directed by MD 14 patch 0   atorvastatin (LIPITOR) 40 MG tablet Take 1 tablet (40 mg total) by mouth daily. 90 tablet 1   doxycycline (VIBRA-TABS) 100 MG tablet Take 1 tablet (100 mg total) by mouth 2 (two) times daily. 20 tablet 0   etodolac (LODINE) 400 MG tablet Take 1 tablet (400 mg total) by mouth 2 (two) times daily. 20 tablet 0   glipiZIDE (GLUCOTROL) 5 MG tablet Take 1 tablet (5 mg total) by  mouth daily before breakfast. 30 tablet 0   Insulin Glargine (BASAGLAR KWIKPEN) 100 UNIT/ML Inject 20 Units into the skin daily. 6 mL 0   lisinopril (ZESTRIL) 20 MG tablet Take 1 tablet (20 mg total) by mouth daily. 30 tablet 0   methocarbamol (ROBAXIN) 500 MG tablet Take 1 tablet (500 mg total) by mouth 2 (two) times daily. 20 tablet 0   No facility-administered medications prior to visit.    Allergies  Allergen Reactions   Lobster [Shellfish Allergy] Anaphylaxis    Just lobster    Reglan [Metoclopramide]     Jinny Blossom  Objective:    BP (!) 147/93   Pulse (!) 104   Ht 5\' 7"  (1.702 m)   Wt 223 lb 9.6 oz (101.4 kg)   LMP  (LMP Unknown)   SpO2 97%   BMI 35.02 kg/m  Wt Readings from Last 3 Encounters:  10/28/23 223 lb 9.6 oz (101.4 kg)  09/13/23 218 lb (98.9 kg)  05/25/23 220 lb (99.8 kg)    Physical Exam Vitals and nursing note reviewed.  Constitutional:      Appearance: She is well-developed.  HENT:     Head: Normocephalic and atraumatic.  Cardiovascular:     Rate and Rhythm: Regular rhythm. Tachycardia present.     Heart sounds: Normal heart sounds. No murmur heard.    No friction rub. No gallop.  Pulmonary:     Effort: Pulmonary effort is normal. No tachypnea or respiratory distress.     Breath sounds: Normal breath sounds. No decreased breath sounds, wheezing, rhonchi or rales.  Chest:     Chest wall: No tenderness.  Abdominal:     General: Bowel sounds are normal.     Palpations: Abdomen is soft.  Musculoskeletal:        General: Normal range of motion.     Cervical back: Normal range of motion.  Skin:    General: Skin is warm and dry.  Neurological:     Mental Status: She is alert and oriented to person, place, and time.     Coordination: Coordination normal.  Psychiatric:        Behavior: Behavior normal. Behavior is cooperative.        Thought Content: Thought content normal.        Judgment: Judgment normal.          Patient has been  counseled extensively about nutrition and exercise as well as the importance of adherence with medications and regular follow-up. The patient was given clear instructions to go to ER or return to medical center if symptoms don't improve, worsen or new problems develop. The patient verbalized understanding.   Follow-up: Return in about 3 months (around 01/26/2024).   Claiborne Rigg, FNP-BC [facility] [place], [place] [phone]   10/28/2023, 7:30 PM

## 2023-10-29 ENCOUNTER — Other Ambulatory Visit: Payer: Self-pay | Admitting: Nurse Practitioner

## 2023-10-29 ENCOUNTER — Other Ambulatory Visit: Payer: Self-pay

## 2023-10-29 DIAGNOSIS — Z794 Long term (current) use of insulin: Secondary | ICD-10-CM

## 2023-10-30 ENCOUNTER — Other Ambulatory Visit: Payer: Self-pay

## 2023-10-30 MED ORDER — TECHLITE PEN NEEDLES 31G X 5 MM MISC
4 refills | Status: DC
  Filled 2023-10-30: qty 100, 100d supply, fill #0

## 2023-10-31 ENCOUNTER — Other Ambulatory Visit: Payer: Self-pay

## 2023-10-31 LAB — CBC WITH DIFFERENTIAL/PLATELET
Basophils Absolute: 0 10*3/uL (ref 0.0–0.2)
Basos: 0 %
EOS (ABSOLUTE): 0.2 10*3/uL (ref 0.0–0.4)
Eos: 2 %
Hematocrit: 48 % — ABNORMAL HIGH (ref 34.0–46.6)
Hemoglobin: 16 g/dL — ABNORMAL HIGH (ref 11.1–15.9)
Immature Grans (Abs): 0 10*3/uL (ref 0.0–0.1)
Immature Granulocytes: 0 %
Lymphocytes Absolute: 2.4 10*3/uL (ref 0.7–3.1)
Lymphs: 30 %
MCH: 30.4 pg (ref 26.6–33.0)
MCHC: 33.3 g/dL (ref 31.5–35.7)
MCV: 91 fL (ref 79–97)
Monocytes Absolute: 0.5 10*3/uL (ref 0.1–0.9)
Monocytes: 6 %
Neutrophils Absolute: 4.9 10*3/uL (ref 1.4–7.0)
Neutrophils: 62 %
Platelets: 323 10*3/uL (ref 150–450)
RBC: 5.27 x10E6/uL (ref 3.77–5.28)
RDW: 12 % (ref 11.7–15.4)
WBC: 8 10*3/uL (ref 3.4–10.8)

## 2023-10-31 LAB — MICROALBUMIN / CREATININE URINE RATIO
Creatinine, Urine: 40.4 mg/dL
Microalb/Creat Ratio: 24 mg/g{creat} (ref 0–29)
Microalbumin, Urine: 9.6 ug/mL

## 2023-10-31 LAB — CMP14+EGFR
ALT: 85 [IU]/L — ABNORMAL HIGH (ref 0–32)
AST: 34 [IU]/L (ref 0–40)
Albumin: 4.3 g/dL (ref 3.9–4.9)
Alkaline Phosphatase: 323 [IU]/L — ABNORMAL HIGH (ref 44–121)
BUN/Creatinine Ratio: 19 (ref 9–23)
BUN: 13 mg/dL (ref 6–24)
Bilirubin Total: 0.4 mg/dL (ref 0.0–1.2)
CO2: 22 mmol/L (ref 20–29)
Calcium: 10 mg/dL (ref 8.7–10.2)
Chloride: 96 mmol/L (ref 96–106)
Creatinine, Ser: 0.67 mg/dL (ref 0.57–1.00)
Globulin, Total: 2.8 g/dL (ref 1.5–4.5)
Glucose: 392 mg/dL — ABNORMAL HIGH (ref 70–99)
Potassium: 4.8 mmol/L (ref 3.5–5.2)
Sodium: 132 mmol/L — ABNORMAL LOW (ref 134–144)
Total Protein: 7.1 g/dL (ref 6.0–8.5)
eGFR: 108 mL/min/{1.73_m2} (ref >59)

## 2023-10-31 LAB — HEMOGLOBIN A1C
Est. average glucose Bld gHb Est-mCnc: 332 mg/dL
Hgb A1c MFr Bld: 13.2 % — ABNORMAL HIGH (ref 4.8–5.6)

## 2023-10-31 LAB — LIPID PANEL
Chol/HDL Ratio: 6.8 {ratio} — ABNORMAL HIGH (ref 0.0–4.4)
Cholesterol, Total: 348 mg/dL — ABNORMAL HIGH (ref 100–199)
HDL: 51 mg/dL (ref >39)
LDL Chol Calc (NIH): 224 mg/dL — ABNORMAL HIGH (ref 0–99)
Triglycerides: 348 mg/dL — ABNORMAL HIGH (ref 0–149)
VLDL Cholesterol Cal: 73 mg/dL — ABNORMAL HIGH (ref 5–40)

## 2023-11-01 ENCOUNTER — Other Ambulatory Visit: Payer: Self-pay

## 2023-11-04 ENCOUNTER — Other Ambulatory Visit: Payer: Self-pay

## 2023-11-06 ENCOUNTER — Other Ambulatory Visit: Payer: Self-pay

## 2023-11-07 ENCOUNTER — Other Ambulatory Visit: Payer: Self-pay

## 2023-11-08 ENCOUNTER — Other Ambulatory Visit: Payer: Self-pay

## 2023-11-12 ENCOUNTER — Other Ambulatory Visit: Payer: Self-pay

## 2023-11-14 ENCOUNTER — Other Ambulatory Visit: Payer: Self-pay

## 2023-11-19 ENCOUNTER — Other Ambulatory Visit: Payer: Self-pay

## 2023-12-02 ENCOUNTER — Other Ambulatory Visit: Payer: Self-pay

## 2023-12-05 ENCOUNTER — Ambulatory Visit: Payer: 59

## 2023-12-06 ENCOUNTER — Ambulatory Visit: Payer: 59

## 2024-04-20 ENCOUNTER — Other Ambulatory Visit: Payer: Self-pay

## 2024-04-20 ENCOUNTER — Encounter (HOSPITAL_COMMUNITY): Payer: Self-pay | Admitting: Internal Medicine

## 2024-04-20 ENCOUNTER — Emergency Department (HOSPITAL_COMMUNITY)

## 2024-04-20 ENCOUNTER — Inpatient Hospital Stay (HOSPITAL_COMMUNITY)
Admission: EM | Admit: 2024-04-20 | Discharge: 2024-04-22 | DRG: 872 | Disposition: A | Discharge status: Home / Self Care | Attending: Internal Medicine | Admitting: Internal Medicine

## 2024-04-20 DIAGNOSIS — Z794 Long term (current) use of insulin: Secondary | ICD-10-CM

## 2024-04-20 DIAGNOSIS — E1165 Type 2 diabetes mellitus with hyperglycemia: Secondary | ICD-10-CM | POA: Diagnosis not present

## 2024-04-20 DIAGNOSIS — R102 Pelvic and perineal pain: Secondary | ICD-10-CM | POA: Diagnosis present

## 2024-04-20 DIAGNOSIS — Z79899 Other long term (current) drug therapy: Secondary | ICD-10-CM

## 2024-04-20 DIAGNOSIS — R310 Gross hematuria: Secondary | ICD-10-CM | POA: Diagnosis present

## 2024-04-20 DIAGNOSIS — N12 Tubulo-interstitial nephritis, not specified as acute or chronic: Principal | ICD-10-CM

## 2024-04-20 DIAGNOSIS — Z833 Family history of diabetes mellitus: Secondary | ICD-10-CM

## 2024-04-20 DIAGNOSIS — K5900 Constipation, unspecified: Secondary | ICD-10-CM | POA: Insufficient documentation

## 2024-04-20 DIAGNOSIS — I10 Essential (primary) hypertension: Secondary | ICD-10-CM | POA: Diagnosis present

## 2024-04-20 DIAGNOSIS — Z91141 Patient's other noncompliance with medication regimen due to financial hardship: Secondary | ICD-10-CM

## 2024-04-20 DIAGNOSIS — Z7984 Long term (current) use of oral hypoglycemic drugs: Secondary | ICD-10-CM

## 2024-04-20 DIAGNOSIS — Z800 Family history of malignant neoplasm of digestive organs: Secondary | ICD-10-CM

## 2024-04-20 DIAGNOSIS — Z87892 Personal history of anaphylaxis: Secondary | ICD-10-CM

## 2024-04-20 DIAGNOSIS — N10 Acute tubulo-interstitial nephritis: Principal | ICD-10-CM | POA: Diagnosis present

## 2024-04-20 DIAGNOSIS — E871 Hypo-osmolality and hyponatremia: Secondary | ICD-10-CM | POA: Diagnosis present

## 2024-04-20 DIAGNOSIS — R319 Hematuria, unspecified: Secondary | ICD-10-CM | POA: Diagnosis present

## 2024-04-20 DIAGNOSIS — K219 Gastro-esophageal reflux disease without esophagitis: Secondary | ICD-10-CM | POA: Diagnosis present

## 2024-04-20 DIAGNOSIS — Z91013 Allergy to seafood: Secondary | ICD-10-CM

## 2024-04-20 DIAGNOSIS — D72829 Elevated white blood cell count, unspecified: Secondary | ICD-10-CM | POA: Diagnosis present

## 2024-04-20 DIAGNOSIS — Z8249 Family history of ischemic heart disease and other diseases of the circulatory system: Secondary | ICD-10-CM

## 2024-04-20 DIAGNOSIS — E860 Dehydration: Secondary | ICD-10-CM | POA: Diagnosis present

## 2024-04-20 DIAGNOSIS — M549 Dorsalgia, unspecified: Secondary | ICD-10-CM | POA: Diagnosis present

## 2024-04-20 DIAGNOSIS — E785 Hyperlipidemia, unspecified: Secondary | ICD-10-CM | POA: Diagnosis not present

## 2024-04-20 DIAGNOSIS — E872 Acidosis, unspecified: Secondary | ICD-10-CM | POA: Diagnosis present

## 2024-04-20 DIAGNOSIS — B3731 Acute candidiasis of vulva and vagina: Secondary | ICD-10-CM | POA: Diagnosis present

## 2024-04-20 DIAGNOSIS — E1140 Type 2 diabetes mellitus with diabetic neuropathy, unspecified: Secondary | ICD-10-CM | POA: Diagnosis present

## 2024-04-20 DIAGNOSIS — Z888 Allergy status to other drugs, medicaments and biological substances status: Secondary | ICD-10-CM

## 2024-04-20 DIAGNOSIS — Z87891 Personal history of nicotine dependence: Secondary | ICD-10-CM

## 2024-04-20 DIAGNOSIS — A419 Sepsis, unspecified organism: Principal | ICD-10-CM | POA: Diagnosis present

## 2024-04-20 LAB — BASIC METABOLIC PANEL WITH GFR
Anion gap: 15 (ref 5–15)
Anion gap: 10 (ref 5–15)
BUN: 13 mg/dL (ref 6–20)
BUN: 12 mg/dL (ref 6–20)
CO2: 16 mmol/L — ABNORMAL LOW (ref 22–32)
CO2: 20 mmol/L — ABNORMAL LOW (ref 22–32)
Calcium: 8.8 mg/dL — ABNORMAL LOW (ref 8.9–10.3)
Calcium: 7.9 mg/dL — ABNORMAL LOW (ref 8.9–10.3)
Chloride: 96 mmol/L — ABNORMAL LOW (ref 98–111)
Chloride: 100 mmol/L (ref 98–111)
Creatinine, Ser: 0.68 mg/dL (ref 0.44–1.00)
Creatinine, Ser: 0.69 mg/dL (ref 0.44–1.00)
GFR, Estimated: >60 mL/min (ref >60)
GFR, Estimated: >60 mL/min (ref >60)
Glucose, Bld: 359 mg/dL — ABNORMAL HIGH (ref 70–99)
Glucose, Bld: 297 mg/dL — ABNORMAL HIGH (ref 70–99)
Potassium: 4.2 mmol/L (ref 3.5–5.1)
Potassium: 4 mmol/L (ref 3.5–5.1)
Sodium: 127 mmol/L — ABNORMAL LOW (ref 135–145)
Sodium: 130 mmol/L — ABNORMAL LOW (ref 135–145)

## 2024-04-20 LAB — URINALYSIS, ROUTINE W REFLEX MICROSCOPIC
Bilirubin Urine: NEGATIVE
Glucose, UA: >=500 mg/dL — AB
Ketones, ur: 80 mg/dL — AB
Nitrite: POSITIVE — AB
Protein, ur: 30 mg/dL — AB
Specific Gravity, Urine: 1.021 (ref 1.005–1.030)
WBC, UA: >50 WBC/hpf (ref 0–5)
pH: 5 (ref 5.0–8.0)

## 2024-04-20 LAB — CBC
HCT: 44.3 % (ref 36.0–46.0)
Hemoglobin: 14.9 g/dL (ref 12.0–15.0)
MCH: 30.4 pg (ref 26.0–34.0)
MCHC: 33.6 g/dL (ref 30.0–36.0)
MCV: 90.4 fL (ref 80.0–100.0)
Platelets: 283 10*3/uL (ref 150–400)
RBC: 4.9 MIL/uL (ref 3.87–5.11)
RDW: 12.2 % (ref 11.5–15.5)
WBC: 17.3 10*3/uL — ABNORMAL HIGH (ref 4.0–10.5)
nRBC: 0 % (ref 0.0–0.2)

## 2024-04-20 LAB — GLUCOSE, CAPILLARY
Glucose-Capillary: 311 mg/dL — ABNORMAL HIGH (ref 70–99)
Glucose-Capillary: 295 mg/dL — ABNORMAL HIGH (ref 70–99)

## 2024-04-20 LAB — HCG, SERUM, QUALITATIVE: Preg, Serum: NEGATIVE

## 2024-04-20 LAB — HIV ANTIBODY (ROUTINE TESTING W REFLEX): HIV Screen 4th Generation wRfx: NONREACTIVE

## 2024-04-20 MED ORDER — ONDANSETRON HCL 4 MG/2ML IJ SOLN: 4.0000 mg | Freq: Four times a day (QID) | INTRAMUSCULAR | Status: DC | PRN

## 2024-04-20 MED ORDER — ATORVASTATIN CALCIUM 40 MG PO TABS
40.0000 mg | ORAL_TABLET | Freq: Every day | ORAL | Status: DC
  Administered 2024-04-20 – 2024-04-22 (×3): 40 mg via ORAL
  Filled 2024-04-20 (×3): qty 1

## 2024-04-20 MED ORDER — SODIUM CHLORIDE 0.9 % IV BOLUS
1000.0000 mL | Freq: Once | INTRAVENOUS | Status: AC
  Administered 2024-04-20: 1000 mL via INTRAVENOUS

## 2024-04-20 MED ORDER — ONDANSETRON HCL 4 MG PO TABS: 4.0000 mg | ORAL_TABLET | Freq: Four times a day (QID) | ORAL | Status: DC | PRN

## 2024-04-20 MED ORDER — MORPHINE SULFATE (PF) 4 MG/ML IV SOLN
8.0000 mg | Freq: Once | INTRAVENOUS | Status: AC
  Administered 2024-04-20: 8 mg via INTRAVENOUS
  Filled 2024-04-20: qty 2

## 2024-04-20 MED ORDER — POLYETHYLENE GLYCOL 3350 17 G PO PACK
17.0000 g | PACK | Freq: Two times a day (BID) | ORAL | Status: DC
  Administered 2024-04-20 – 2024-04-22 (×5): 17 g via ORAL
  Filled 2024-04-20 (×5): qty 1

## 2024-04-20 MED ORDER — INSULIN GLARGINE-YFGN 100 UNIT/ML ~~LOC~~ SOLN
20.0000 [IU] | Freq: Every day | SUBCUTANEOUS | Status: DC
  Administered 2024-04-20: 20 [IU] via SUBCUTANEOUS
  Filled 2024-04-20: qty 0.2

## 2024-04-20 MED ORDER — SODIUM CHLORIDE 0.9 % IV SOLN
2.0000 g | INTRAVENOUS | Status: DC
  Administered 2024-04-20 – 2024-04-22 (×3): 2 g via INTRAVENOUS
  Filled 2024-04-20 (×3): qty 20

## 2024-04-20 MED ORDER — SORBITOL 70 % SOLN
30.0000 mL | Freq: Once | Status: AC
  Administered 2024-04-20: 30 mL via ORAL
  Filled 2024-04-20: qty 30

## 2024-04-20 MED ORDER — OXYCODONE HCL 5 MG PO TABS
5.0000 mg | ORAL_TABLET | ORAL | Status: DC | PRN
  Administered 2024-04-20 – 2024-04-21 (×2): 5 mg via ORAL
  Filled 2024-04-20 (×2): qty 1

## 2024-04-20 MED ORDER — BISACODYL 10 MG RE SUPP: 10.0000 mg | Freq: Once | RECTAL | Status: DC

## 2024-04-20 MED ORDER — HYDRALAZINE HCL 20 MG/ML IJ SOLN: 10.0000 mg | Freq: Four times a day (QID) | INTRAMUSCULAR | Status: DC | PRN

## 2024-04-20 MED ORDER — ONDANSETRON HCL 4 MG/2ML IJ SOLN
4.0000 mg | Freq: Once | INTRAMUSCULAR | Status: AC
  Administered 2024-04-20: 4 mg via INTRAVENOUS
  Filled 2024-04-20: qty 2

## 2024-04-20 MED ORDER — ENOXAPARIN SODIUM 40 MG/0.4ML IJ SOSY: 40.0000 mg | PREFILLED_SYRINGE | INTRAMUSCULAR | Status: DC

## 2024-04-20 MED ORDER — ALUM & MAG HYDROXIDE-SIMETH 200-200-20 MG/5ML PO SUSP
30.0000 mL | Freq: Once | ORAL | Status: AC
  Administered 2024-04-20: 30 mL via ORAL
  Filled 2024-04-20: qty 30

## 2024-04-20 MED ORDER — SODIUM CHLORIDE 0.9% FLUSH
3.0000 mL | Freq: Two times a day (BID) | INTRAVENOUS | Status: DC
  Administered 2024-04-20 – 2024-04-22 (×3): 3 mL via INTRAVENOUS

## 2024-04-20 MED ORDER — KETOROLAC TROMETHAMINE 30 MG/ML IJ SOLN: 30.0000 mg | Freq: Four times a day (QID) | INTRAMUSCULAR | Status: DC | PRN

## 2024-04-20 MED ORDER — ACETAMINOPHEN 650 MG RE SUPP: 650.0000 mg | Freq: Four times a day (QID) | RECTAL | Status: DC | PRN

## 2024-04-20 MED ORDER — INSULIN ASPART 100 UNIT/ML IJ SOLN
0.0000 [IU] | Freq: Three times a day (TID) | INTRAMUSCULAR | Status: DC
  Administered 2024-04-20: 11 [IU] via SUBCUTANEOUS
  Administered 2024-04-21 – 2024-04-22 (×4): 8 [IU] via SUBCUTANEOUS
  Administered 2024-04-22: 5 [IU] via SUBCUTANEOUS

## 2024-04-20 MED ORDER — INSULIN GLARGINE-YFGN 100 UNIT/ML ~~LOC~~ SOLN
20.0000 [IU] | Freq: Every day | SUBCUTANEOUS | Status: DC
  Filled 2024-04-20: qty 0.2

## 2024-04-20 MED ORDER — ACETAMINOPHEN 325 MG PO TABS
650.0000 mg | ORAL_TABLET | Freq: Four times a day (QID) | ORAL | Status: DC | PRN
  Administered 2024-04-20 – 2024-04-21 (×3): 650 mg via ORAL
  Filled 2024-04-20 (×3): qty 2

## 2024-04-20 MED ORDER — LISINOPRIL 10 MG PO TABS
20.0000 mg | ORAL_TABLET | Freq: Every day | ORAL | Status: DC
  Administered 2024-04-20: 20 mg via ORAL
  Filled 2024-04-20 (×2): qty 2

## 2024-04-20 MED ORDER — SODIUM CHLORIDE 0.9 % IV BOLUS
2000.0000 mL | Freq: Once | INTRAVENOUS | Status: AC
  Administered 2024-04-20: 2000 mL via INTRAVENOUS

## 2024-04-20 MED ORDER — SODIUM CHLORIDE 0.9 % IV SOLN: INTRAVENOUS | Status: DC

## 2024-04-20 MED ORDER — LIDOCAINE VISCOUS HCL 2 % MT SOLN
15.0000 mL | Freq: Once | OROMUCOSAL | Status: DC
  Filled 2024-04-20: qty 15

## 2024-04-20 MED ORDER — SENNOSIDES-DOCUSATE SODIUM 8.6-50 MG PO TABS
1.0000 | ORAL_TABLET | Freq: Two times a day (BID) | ORAL | Status: DC
  Administered 2024-04-20 – 2024-04-22 (×5): 1 via ORAL
  Filled 2024-04-20 (×5): qty 1

## 2024-04-20 MED ORDER — PANTOPRAZOLE SODIUM 40 MG PO TBEC
40.0000 mg | DELAYED_RELEASE_TABLET | Freq: Two times a day (BID) | ORAL | Status: DC
  Administered 2024-04-20 – 2024-04-22 (×4): 40 mg via ORAL
  Filled 2024-04-20 (×4): qty 1

## 2024-04-20 MED ORDER — GABAPENTIN 300 MG PO CAPS
300.0000 mg | ORAL_CAPSULE | Freq: Three times a day (TID) | ORAL | Status: DC
  Administered 2024-04-20 – 2024-04-22 (×6): 300 mg via ORAL
  Filled 2024-04-20 (×6): qty 1

## 2024-04-20 NOTE — ED Provider Notes (Addendum)
 [place] EMERGENCY DEPARTMENT AT [facility] Provider Note   CSN: [id number] Arrival date & time: 04/20/24  4098     History  Chief Complaint  Patient presents with   Hematuria    Wendy Olson is a 48 y.o. female.  48 year old female presents with hematuria since yesterday.  Patient states for several days she has had left-sided flank pain which is now developed into gross hematuria.  Does note some dysuria.  No prior history of UTIs.  Low-grade temperature at home.  No vomiting or diarrhea.  Denies taking any blood thinners       Home Medications Prior to Admission medications   Medication Sig Start Date End Date Taking? Authorizing Provider  Accu-Chek Softclix Lancets lancets Use as instructed. Check blood glucose level by fingerstick 3x per day. 10/28/23   Collins Dean, NP  atorvastatin  (LIPITOR) 40 MG tablet Take 1 tablet (40 mg total) by mouth daily. 10/28/23   Fleming, Zelda W, NP  Continuous Glucose Receiver (FREESTYLE LIBRE 14 DAY READER) DEVI Use as instructed. Check blood glucose levels continuously during the day E11.65 Z79.4 10/28/23   Fleming, Zelda W, NP  Continuous Glucose Sensor (FREESTYLE LIBRE 14 DAY SENSOR) MISC Use as instructed. Check blood glucose levels continuously during the day E11.65 Z79.4 10/28/23   Fleming, Zelda W, NP  empagliflozin  (JARDIANCE ) 10 MG TABS tablet Take 1 tablet (10 mg total) by mouth daily before breakfast. 10/28/23   Fleming, Zelda W, NP  gabapentin  (NEURONTIN ) 300 MG capsule Take 1 capsule (300 mg total) by mouth 3 (three) times daily. 01/08/22   Fleming, Zelda W, NP  glipiZIDE  (GLUCOTROL ) 5 MG tablet Take 1 tablet (5 mg total) by mouth 2 (two) times daily before a meal. 10/28/23   Collins Dean, NP  glucose blood (ACCU-CHEK GUIDE TEST) test strip Use to check blood sugar three times daily. 10/28/23   Newlin, Enobong, MD  insulin  glargine (LANTUS  SOLOSTAR) 100 UNIT/ML Solostar Pen Inject 20 Units into the skin daily.  10/28/23   Newlin, Enobong, MD  Insulin  Pen Needle (TECHLITE PEN NEEDLES) 31G X 5 MM MISC Use to inject insulin  once daily. 10/30/23   Newlin, Enobong, MD  lidocaine  (LIDODERM ) 5 % Place 1 patch onto the skin daily. Remove & Discard patch within 12 hours or as directed by MD 05/25/23   Lucina Sabal, PA-C  lisinopril  (ZESTRIL ) 20 MG tablet Take 1 tablet (20 mg total) by mouth daily. 10/28/23   Collins Dean, NP      Allergies    Lobster [shellfish allergy] and Reglan  [metoclopramide ]    Review of Systems   Review of Systems  All other systems reviewed and are negative.   Physical Exam Updated Vital Signs BP (!) 128/97 (BP Location: Left Arm)   Pulse (!) 103   Temp 100.3 F (37.9 C) (Oral)   Resp 16   Ht 1.702 m (5\' 7" )   Wt 99.8 kg   SpO2 99%   BMI 34.46 kg/m  Physical Exam Vitals and nursing note reviewed.  Constitutional:      General: She is not in acute distress.    Appearance: Normal appearance. She is well-developed. She is not toxic-appearing.  HENT:     Head: Normocephalic and atraumatic.  Eyes:     General: Lids are normal.     Conjunctiva/sclera: Conjunctivae normal.     Pupils: Pupils are equal, round, and reactive to light.  Neck:     Thyroid: No thyroid mass.  Trachea: No tracheal deviation.  Cardiovascular:     Rate and Rhythm: Normal rate and regular rhythm.     Heart sounds: Normal heart sounds. No murmur heard.    No gallop.  Pulmonary:     Effort: Pulmonary effort is normal. No respiratory distress.     Breath sounds: Normal breath sounds. No stridor. No decreased breath sounds, wheezing, rhonchi or rales.  Abdominal:     General: There is no distension.     Palpations: Abdomen is soft.     Tenderness: There is no abdominal tenderness. There is no rebound.  Musculoskeletal:        General: No tenderness. Normal range of motion.     Cervical back: Normal range of motion and neck supple.  Skin:    General: Skin is warm and dry.     Findings: No  abrasion or rash.  Neurological:     Mental Status: She is alert and oriented to person, place, and time. Mental status is at baseline.     GCS: GCS eye subscore is 4. GCS verbal subscore is 5. GCS motor subscore is 6.     Cranial Nerves: No cranial nerve deficit.     Sensory: No sensory deficit.     Motor: Motor function is intact.  Psychiatric:        Attention and Perception: Attention normal.        Speech: Speech normal.        Behavior: Behavior normal.     ED Results / Procedures / Treatments   Labs (all labs ordered are listed, but only abnormal results are displayed) Labs Reviewed  URINALYSIS, ROUTINE W REFLEX MICROSCOPIC  HCG, SERUM, QUALITATIVE  BASIC METABOLIC PANEL WITH GFR  CBC    EKG None  Radiology No results found.  Procedures Procedures    Medications Ordered in ED Medications - No data to display  ED Course/ Medical Decision Making/ A&P                                 Medical Decision Making Amount and/or Complexity of Data Reviewed Labs: ordered. Radiology: ordered.  Risk Prescription drug management. Decision regarding hospitalization.   Patient presented with flank pain and concern for possible renal colic.  Her urinalysis shows infection as well as dehydration.  Given IV fluids and antiemetics.  Does have a white count of 17.3 thousand.  Renal CT showed no evidence of obstruction.  Patient was hyperglycemic and that did improve with IV fluids.  She was slightly acidotic.  After receiving 2 g of Rocephin and 2 L of fluid patient continues to endorse weakness.  Due to patient's leukocytosis and low-grade temperature and likely diagnosis of pyelonephritis.  She will require admission CRITICAL CARE Performed by: Eden Goodpasture Total critical care time: 45 minutes Critical care time was exclusive of separately billable procedures and treating other patients. Critical care was necessary to treat or prevent imminent or life-threatening  deterioration. Critical care was time spent personally by me on the following activities: development of treatment plan with patient and/or surrogate as well as nursing, discussions with consultants, evaluation of patient's response to treatment, examination of patient, obtaining history from patient or surrogate, ordering and performing treatments and interventions, ordering and review of laboratory studies, ordering and review of radiographic studies, pulse oximetry and re-evaluation of patient's condition.         Final Clinical Impression(s) / ED  Diagnoses Final diagnoses:  None    Rx / DC Orders ED Discharge Orders     None         Lind Repine, MD 04/20/24 1339    Lind Repine, MD 04/20/24 1340

## 2024-04-20 NOTE — Plan of Care (Signed)

## 2024-04-20 NOTE — ED Triage Notes (Signed)
 Pt started peeing blood yesterday, thought it was just concentrated from dehydration but it got worse. Febrile in triage. Endorses abd and back pain.

## 2024-04-20 NOTE — H&P (Signed)
 History and Physical    Wendy Olson:[id number] DOB: 04/03/1976 DOA: 04/20/2024  PCP: Collins Dean, NP  Patient coming from: Home  I have personally briefly reviewed patient's old medical records in [facility]  Chief Complaint: Flank pain/hematuria  HPI: Wendy Olson is a 48 y.o. female with medical history significant of diabetes mellitus type 2, hypertension, hyperlipidemia, GERD, sciatica presenting to the ED with a 2-day history of left-sided flank pain, hematuria, dysuria, suprapubic abdominal pain, nausea, lightheadedness and weakness.  Patient does endorse subjective fevers, chills, constipation, heartburn.  Patient denies any chest pain, no shortness of breath, no hematemesis, no melena, no hematochezia.  No syncopal episode.  No diarrhea.  ED Course: Patient seen in the ED, urinalysis done hazy, moderate hemoglobin, greater than 500 glucose, 80 ketones, moderate leukocytes, positive nitrites, 30 protein, rare bacteria, 11-20 RBCs, WBC clumps present, WBC > 50.  Basic metabolic profile with a sodium of 127, chloride of 96, bicarb of 16, glucose of 359, creatinine of 0.68.  Repeat BMP after IV fluids with a sodium of 130, bicarb of 20, glucose of 297, creatinine of 0.69.  CBC done with a white count of 17.3 otherwise within normal limits.  CT renal stone protocol done with no obstructing renal stones or hydronephrosis, fatty infiltration of the liver.  Patient given IV Rocephin, IV fluids however due to ongoing pain, significant weakness hospitalist were called for admission.  Review of Systems: As per HPI otherwise all other systems reviewed and are negative.  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 Her smoking use included cigarettes. She has never used smokeless tobacco. She reports that she does not drink alcohol and does not use drugs.  Allergies  Allergen Reactions   Lobster [Shellfish Allergy] Anaphylaxis    Just lobster    Reglan  [Metoclopramide ]     akasthesia    Family History  Problem Relation Age of Onset   Diabetes Mother    Hypertension Mother    Dementia Mother    Pancreatic cancer Father    Diabetes Father    Hypertension Father    Colon cancer Neg Hx    Colon polyps Neg Hx    Esophageal cancer Neg Hx    Rectal cancer Neg Hx    Stomach cancer Neg Hx    Mother deceased in his 67s from dementia.  Father deceased age 57 from pancreatic cancer.  Prior to Admission medications   Medication Sig Start Date End Date Taking? Authorizing Provider  Accu-Chek Softclix Lancets lancets Use as instructed. Check blood glucose level by fingerstick 3x per day. 10/28/23   Collins Dean, NP  atorvastatin  (LIPITOR) 40 MG tablet Take 1 tablet (40 mg total) by mouth daily. 10/28/23   Fleming, Zelda W, NP  Continuous Glucose Receiver (FREESTYLE LIBRE 14 DAY READER) DEVI Use as instructed. Check blood glucose levels continuously during the day E11.65 Z79.4 10/28/23   Fleming, Zelda W, NP  Continuous Glucose Sensor (FREESTYLE LIBRE 14 DAY SENSOR) MISC  Use as instructed. Check blood glucose levels continuously during the day E11.65 Z79.4 10/28/23   Fleming, Zelda W, NP  empagliflozin  (JARDIANCE ) 10 MG TABS tablet Take 1 tablet (10 mg total) by mouth daily before breakfast. 10/28/23   Fleming, Zelda W, NP  gabapentin  (NEURONTIN ) 300 MG capsule Take 1 capsule (300 mg total) by mouth 3 (three) times daily. 01/08/22   Fleming, Zelda W, NP  glipiZIDE  (GLUCOTROL ) 5 MG tablet Take 1 tablet (5 mg total) by mouth 2 (two) times daily before a meal. 10/28/23   Collins Dean, NP  glucose blood (ACCU-CHEK GUIDE  TEST) test strip Use to check blood sugar three times daily. 10/28/23   Newlin, Enobong, MD  insulin  glargine (LANTUS  SOLOSTAR) 100 UNIT/ML Solostar Pen Inject 20 Units into the skin daily. 10/28/23   Newlin, Enobong, MD  Insulin  Pen Needle (TECHLITE PEN NEEDLES) 31G X 5 MM MISC Use to inject insulin  once daily. 10/30/23   Newlin, Enobong, MD  lidocaine  (LIDODERM ) 5 % Place 1 patch onto the skin daily. Remove & Discard patch within 12 hours or as directed by MD 05/25/23   Lucina Sabal, PA-C  lisinopril  (ZESTRIL ) 20 MG tablet Take 1 tablet (20 mg total) by mouth daily. 10/28/23   Collins Dean, NP    Physical Exam: Vitals:   04/20/24 1042 04/20/24 1139 04/20/24 1300 04/20/24 1513  BP: 112/72  (!) 144/93 122/67  Pulse: 98  (!) 109 (!) 106  Resp: 17  18   Temp:  99.9 F (37.7 C)  98.7 F (37.1 C)  TempSrc:  Oral  Oral  SpO2: 100%  98% 97%  Weight:      Height:        Constitutional: NAD, calm, comfortable Vitals:   04/20/24 1042 04/20/24 1139 04/20/24 1300 04/20/24 1513  BP: 112/72  (!) 144/93 122/67  Pulse: 98  (!) 109 (!) 106  Resp: 17  18   Temp:  99.9 F (37.7 C)  98.7 F (37.1 C)  TempSrc:  Oral  Oral  SpO2: 100%  98% 97%  Weight:      Height:       Eyes: PERRL, lids and conjunctivae normal ENMT: Mucous membranes are dry. Posterior pharynx clear of any exudate or lesions.Normal dentition.  Neck: normal, supple, no masses, no thyromegaly Respiratory: clear to auscultation bilaterally, no wheezing, no crackles. Normal respiratory effort. No accessory muscle use.  Cardiovascular: Tachycardia.  No murmurs rubs or gallops.  No lower extremity edema.  2+ pedal pulses.  Abdomen: Abdomen is soft, nondistended, tender to palpation mid abdomen as well as suprapubic region.  Bilateral CVA TTP  L > R.  Positive bowel sounds.  No hepatosplenomegaly.  No rebound.  No guarding. Musculoskeletal: no clubbing / cyanosis. No joint deformity upper and lower extremities. Good ROM, no contractures.  Normal muscle tone.  Skin: no rashes, lesions, ulcers. No induration Neurologic: CN 2-12 grossly intact. Sensation intact, DTR normal. Strength 5/5 in all 4.  Psychiatric: Normal judgment and insight. Alert and oriented x 3. Normal mood.  Labs on Admission: I have personally reviewed following labs and imaging studies  CBC: Recent Labs  Lab 04/20/24 0820  WBC 17.3*  HGB 14.9  HCT 44.3  MCV 90.4  PLT 283    Basic Metabolic Panel: Recent Labs  Lab 04/20/24 0820 04/20/24 1208  NA 127* 130*  K 4.2 4.0  CL 96* 100  CO2 16* 20*  GLUCOSE 359* 297*  BUN 13 12  CREATININE 0.68  0.69  CALCIUM  8.8* 7.9*    GFR: Estimated Creatinine Clearance: 105.5 mL/min (by C-G formula based on SCr of 0.69 mg/dL).  Liver Function Tests: No results for input(s): "AST", "ALT", "ALKPHOS", "BILITOT", "PROT", "ALBUMIN" in the last 168 hours.  Urine analysis:    Component Value Date/Time   COLORURINE YELLOW 04/20/2024 0752   APPEARANCEUR HAZY (A) 04/20/2024 0752   LABSPEC 1.021 04/20/2024 0752   PHURINE 5.0 04/20/2024 0752   GLUCOSEU >=500 (A) 04/20/2024 0752   HGBUR MODERATE (A) 04/20/2024 0752   BILIRUBINUR NEGATIVE 04/20/2024 0752   BILIRUBINUR Negative 12/12/2017 0855   KETONESUR 80 (A) 04/20/2024 0752   PROTEINUR 30 (A) 04/20/2024 0752   UROBILINOGEN 0.2 12/12/2017 0855   UROBILINOGEN 0.2 05/08/2012 0754   NITRITE POSITIVE (A) 04/20/2024 0752   LEUKOCYTESUR MODERATE (A) 04/20/2024 0752    Radiological Exams on Admission: CT Renal Stone Study Result Date: 04/20/2024 CLINICAL DATA:  Flank pain.  Hematuria. EXAM: CT ABDOMEN AND PELVIS WITHOUT CONTRAST TECHNIQUE: Multidetector CT imaging of the abdomen and pelvis was performed following the standard protocol without IV contrast. RADIATION DOSE REDUCTION: This exam was performed according to the departmental dose-optimization program which includes automated exposure control, adjustment of the mA and/or kV according to patient size and/or  use of iterative reconstruction technique. COMPARISON:  10/17/2018. FINDINGS: Lower chest: No acute abnormality. No pleural or pericardial effusions. Hepatobiliary: Hypodensity consistent with fatty infiltration. No focal liver abnormality is seen. No gallstones, gallbladder wall thickening, or biliary dilatation. Pancreas: Unremarkable. No pancreatic ductal dilatation or surrounding inflammatory changes. Spleen: Normal in size without focal abnormality. Adrenals/Urinary Tract: Adrenal glands are unremarkable. Kidneys are normal, without renal calculi, focal lesion, or hydronephrosis. Bladder is unremarkable. Stomach/Bowel: Stomach is within normal limits. Appendix appears normal. No evidence of bowel wall thickening, distention, or inflammatory changes. Vascular/Lymphatic: Aortic atherosclerosis. No enlarged abdominal or pelvic lymph nodes. Reproductive: Uterus and bilateral adnexa are unremarkable. Other: No abdominal wall hernia or abnormality. No abdominopelvic ascites. Musculoskeletal: No acute or significant osseous findings. IMPRESSION: 1. Fatty infiltration of the liver. 2. No obstructing renal stones or hydronephrosis. 3. No acute abdominal or pelvic pathology identified. 4. Aortic atherosclerosis (ICD10-I70.0). Electronically Signed   By: Sydell Eva M.D.   On: 04/20/2024 10:06    EKG: Independently reviewed.  Not done  Assessment/Plan Principal Problem:   Acute pyelonephritis Active Problems:   Type 2 diabetes mellitus with hyperglycemia, without long-term current use of insulin  (HCC)   Hypertension   Essential hypertension   Hyperlipidemia   GERD (gastroesophageal reflux disease)   Hematuria   Dehydration   Hyponatremia   Acidosis, metabolic   #1 acute pyelonephritis -Patient presented with a 2-day history of suprapubic abdominal pain, left flank pain worsening, hematuria, dysuria.  Urinalysis done consistent with a UTI. - CT renal stone protocol done negative for obstructing  renal stones or hydronephrosis. - Check a urine culture. -Check blood cultures x 2. - Continue IV Rocephin, IV fluids. - Pain management, IV antiemetics, supportive care.  2.  Hematuria -Likely secondary to problem #1.  3.  Diabetes mellitus type 2 -Check a hemoglobin A1c - Resume home regimen Lantus  20 units daily, SSI. - Hold oral hypoglycemic agents. - Resume home regimen Neurontin   4.  Hyperlipidemia -Resume home regimen statin.  5.  Hypertension -Resume home regimen lisinopril .  6.  Dehydration -IV fluids.  7.  Hyponatremia -Likely pseudohyponatremia secondary to hyperglycemia in the setting of dehydration. - IV fluids.  8.  Metabolic acidosis -Likely secondary to  diabetes mellitus type 2 with hyperglycemia in the setting of an acute infection. - Acidosis improving with hydration. - Continue IV fluids, supportive care.  9.  GERD -GI cocktail x 1. - PPI twice daily.   DVT prophylaxis: SCDs Code Status:   Full Family Communication:  Updated patient and boyfriend at bedside. Disposition Plan:   Patient is from:  home  Anticipated DC to:  Home  Anticipated DC date:  2 to 3 days  Anticipated DC barriers: Clinical improvement  Consults called:  None Admission status:  Place in observation  Severity of Illness: The appropriate patient status for this patient is OBSERVATION. Observation status is judged to be reasonable and necessary in order to provide the required intensity of service to ensure the patient's safety. The patient's presenting symptoms, physical exam findings, and initial radiographic and laboratory data in the context of their medical condition is felt to place them at decreased risk for further clinical deterioration. Furthermore, it is anticipated that the patient will be medically stable for discharge from the hospital within 2 midnights of admission.     Hilda Lovings MD [facility]  How to contact the [facility] Attending or Consulting  provider 7A - 7P or covering provider during after hours 7P -7A, for this patient?   Check the care team in [facility] and look for a) attending/consulting [facility] provider listed and b) the [facility] team listed Log into [email or web address] and use [facility] universal password to access. If you do not have the password, please contact the hospital operator. Locate the [facility] provider you are looking for under [facility] and page to a number that you can be directly reached. If you still have difficulty reaching the provider, please page the [facility] (Director on Call) for the Hospitalists listed on amion for assistance.  04/20/2024, 3:36 PM

## 2024-04-21 DIAGNOSIS — E1165 Type 2 diabetes mellitus with hyperglycemia: Secondary | ICD-10-CM | POA: Diagnosis present

## 2024-04-21 DIAGNOSIS — R310 Gross hematuria: Secondary | ICD-10-CM | POA: Diagnosis present

## 2024-04-21 DIAGNOSIS — Z7984 Long term (current) use of oral hypoglycemic drugs: Secondary | ICD-10-CM | POA: Diagnosis not present

## 2024-04-21 DIAGNOSIS — Z800 Family history of malignant neoplasm of digestive organs: Secondary | ICD-10-CM | POA: Diagnosis not present

## 2024-04-21 DIAGNOSIS — Z87892 Personal history of anaphylaxis: Secondary | ICD-10-CM | POA: Diagnosis not present

## 2024-04-21 DIAGNOSIS — Z91013 Allergy to seafood: Secondary | ICD-10-CM | POA: Diagnosis not present

## 2024-04-21 DIAGNOSIS — Z833 Family history of diabetes mellitus: Secondary | ICD-10-CM | POA: Diagnosis not present

## 2024-04-21 DIAGNOSIS — K5900 Constipation, unspecified: Secondary | ICD-10-CM | POA: Diagnosis present

## 2024-04-21 DIAGNOSIS — N10 Acute tubulo-interstitial nephritis: Secondary | ICD-10-CM | POA: Diagnosis present

## 2024-04-21 DIAGNOSIS — D72829 Elevated white blood cell count, unspecified: Secondary | ICD-10-CM | POA: Diagnosis present

## 2024-04-21 DIAGNOSIS — I10 Essential (primary) hypertension: Secondary | ICD-10-CM | POA: Diagnosis present

## 2024-04-21 DIAGNOSIS — B3731 Acute candidiasis of vulva and vagina: Secondary | ICD-10-CM | POA: Diagnosis present

## 2024-04-21 DIAGNOSIS — A419 Sepsis, unspecified organism: Secondary | ICD-10-CM | POA: Diagnosis present

## 2024-04-21 DIAGNOSIS — Z87891 Personal history of nicotine dependence: Secondary | ICD-10-CM | POA: Diagnosis not present

## 2024-04-21 DIAGNOSIS — E785 Hyperlipidemia, unspecified: Secondary | ICD-10-CM | POA: Diagnosis present

## 2024-04-21 DIAGNOSIS — Z8249 Family history of ischemic heart disease and other diseases of the circulatory system: Secondary | ICD-10-CM | POA: Diagnosis not present

## 2024-04-21 DIAGNOSIS — Z79899 Other long term (current) drug therapy: Secondary | ICD-10-CM | POA: Diagnosis not present

## 2024-04-21 DIAGNOSIS — K219 Gastro-esophageal reflux disease without esophagitis: Secondary | ICD-10-CM | POA: Diagnosis present

## 2024-04-21 DIAGNOSIS — Z888 Allergy status to other drugs, medicaments and biological substances status: Secondary | ICD-10-CM | POA: Diagnosis not present

## 2024-04-21 DIAGNOSIS — N12 Tubulo-interstitial nephritis, not specified as acute or chronic: Secondary | ICD-10-CM | POA: Diagnosis present

## 2024-04-21 DIAGNOSIS — E1140 Type 2 diabetes mellitus with diabetic neuropathy, unspecified: Secondary | ICD-10-CM | POA: Diagnosis present

## 2024-04-21 DIAGNOSIS — E860 Dehydration: Secondary | ICD-10-CM | POA: Diagnosis present

## 2024-04-21 DIAGNOSIS — E872 Acidosis, unspecified: Secondary | ICD-10-CM | POA: Diagnosis present

## 2024-04-21 DIAGNOSIS — Z794 Long term (current) use of insulin: Secondary | ICD-10-CM | POA: Diagnosis not present

## 2024-04-21 LAB — COMPREHENSIVE METABOLIC PANEL WITH GFR
ALT: 29 U/L (ref 0–44)
AST: 19 U/L (ref 15–41)
Albumin: 2.8 g/dL — ABNORMAL LOW (ref 3.5–5.0)
Alkaline Phosphatase: 176 U/L — ABNORMAL HIGH (ref 38–126)
Anion gap: 10 (ref 5–15)
BUN: 11 mg/dL (ref 6–20)
CO2: 19 mmol/L — ABNORMAL LOW (ref 22–32)
Calcium: 8.2 mg/dL — ABNORMAL LOW (ref 8.9–10.3)
Chloride: 103 mmol/L (ref 98–111)
Creatinine, Ser: 0.77 mg/dL (ref 0.44–1.00)
GFR, Estimated: >60 mL/min (ref >60)
Glucose, Bld: 288 mg/dL — ABNORMAL HIGH (ref 70–99)
Potassium: 4.2 mmol/L (ref 3.5–5.1)
Sodium: 132 mmol/L — ABNORMAL LOW (ref 135–145)
Total Bilirubin: 1.3 mg/dL — ABNORMAL HIGH (ref 0.0–1.2)
Total Protein: 6.1 g/dL — ABNORMAL LOW (ref 6.5–8.1)

## 2024-04-21 LAB — CBC
HCT: 38.9 % (ref 36.0–46.0)
Hemoglobin: 12.8 g/dL (ref 12.0–15.0)
MCH: 31 pg (ref 26.0–34.0)
MCHC: 32.9 g/dL (ref 30.0–36.0)
MCV: 94.2 fL (ref 80.0–100.0)
Platelets: 221 10*3/uL (ref 150–400)
RBC: 4.13 MIL/uL (ref 3.87–5.11)
RDW: 12.3 % (ref 11.5–15.5)
WBC: 11.6 10*3/uL — ABNORMAL HIGH (ref 4.0–10.5)
nRBC: 0 % (ref 0.0–0.2)

## 2024-04-21 LAB — GLUCOSE, CAPILLARY
Glucose-Capillary: 260 mg/dL — ABNORMAL HIGH (ref 70–99)
Glucose-Capillary: 277 mg/dL — ABNORMAL HIGH (ref 70–99)
Glucose-Capillary: 275 mg/dL — ABNORMAL HIGH (ref 70–99)
Glucose-Capillary: 273 mg/dL — ABNORMAL HIGH (ref 70–99)

## 2024-04-21 LAB — HEMOGLOBIN A1C
Hgb A1c MFr Bld: 11.6 % — ABNORMAL HIGH (ref 4.8–5.6)
Mean Plasma Glucose: 286.22 mg/dL

## 2024-04-21 LAB — MAGNESIUM: Magnesium: 1.9 mg/dL (ref 1.7–2.4)

## 2024-04-21 LAB — PHOSPHORUS: Phosphorus: 1.9 mg/dL — ABNORMAL LOW (ref 2.5–4.6)

## 2024-04-21 MED ORDER — INSULIN GLARGINE-YFGN 100 UNIT/ML ~~LOC~~ SOLN
24.0000 [IU] | Freq: Every day | SUBCUTANEOUS | Status: DC
  Filled 2024-04-21: qty 0.24

## 2024-04-21 MED ORDER — CLOTRIMAZOLE 1 % VA CREA
1.0000 | TOPICAL_CREAM | Freq: Every day | VAGINAL | Status: DC
  Administered 2024-04-21: 1 via VAGINAL
  Filled 2024-04-21: qty 45

## 2024-04-21 MED ORDER — INSULIN ASPART PROT & ASPART (70-30 MIX) 100 UNIT/ML ~~LOC~~ SUSP
16.0000 [IU] | Freq: Two times a day (BID) | SUBCUTANEOUS | Status: DC
  Administered 2024-04-21 – 2024-04-22 (×2): 16 [IU] via SUBCUTANEOUS
  Filled 2024-04-21: qty 10

## 2024-04-21 MED ORDER — FLUCONAZOLE 150 MG PO TABS
150.0000 mg | ORAL_TABLET | Freq: Once | ORAL | Status: AC
  Administered 2024-04-21: 150 mg via ORAL
  Filled 2024-04-21 (×2): qty 1

## 2024-04-21 MED ORDER — INSULIN ASPART 100 UNIT/ML IJ SOLN
4.0000 [IU] | Freq: Three times a day (TID) | INTRAMUSCULAR | Status: DC
  Administered 2024-04-21: 4 [IU] via SUBCUTANEOUS

## 2024-04-21 MED ORDER — POTASSIUM PHOSPHATES 15 MMOLE/5ML IV SOLN
30.0000 mmol | Freq: Once | INTRAVENOUS | Status: AC
  Administered 2024-04-21: 30 mmol via INTRAVENOUS
  Filled 2024-04-21: qty 10

## 2024-04-21 MED ORDER — ALUM & MAG HYDROXIDE-SIMETH 200-200-20 MG/5ML PO SUSP
30.0000 mL | ORAL | Status: DC | PRN
  Administered 2024-04-21: 30 mL via ORAL
  Filled 2024-04-21: qty 30

## 2024-04-21 NOTE — Plan of Care (Signed)

## 2024-04-21 NOTE — Inpatient Diabetes Management (Signed)
 Inpatient Diabetes Program Recommendations  AACE/ADA: New Consensus Statement on Inpatient Glycemic Control (2015)  Target Ranges:  Prepandial:   less than 140 mg/dL      Peak postprandial:   less than 180 mg/dL (1-2 hours)      Critically ill patients:  140 - 180 mg/dL   Lab Results  Component Value Date   GLUCAP 277 (H) 04/21/2024   HGBA1C 11.6 (H) 04/21/2024    Review of Glycemic Control  Diabetes history: DM2 Outpatient Diabetes medications: glipizide  5 mg BID, Lantus  20 units QHS Current orders for Inpatient glycemic control: Semglee  24 units at bedtime, Novolog  0-15 TID with meals + 4 units TID  Inpatient Diabetes Program Recommendations:    70/30 16 units BID for discharge.  (If starting while inpatient, d/c Semglee  and Novolog  4 units TID)  Spoke with pt at bedside regarding her HgbA1C of 11.6%. Pt states she hasn't been taking her insulin  for awhile d/t financial contraints.  Explained how hyperglycemia leads to damage within blood vessels which lead to the common complications seen with uncontrolled diabetes. Stressed to the patient the importance of improving glycemic control to prevent further complications from uncontrolled diabetes. Discussed impact of nutrition, exercise, stress, sickness, and medications on diabetes control.  Will complete Diabetes Discharge Order Set. Will need meter and supplies.  Continue to follow.  Thank you. Joni Net, RD, LDN, CDCES Inpatient Diabetes Coordinator [phone]

## 2024-04-21 NOTE — Progress Notes (Addendum)
 PROGRESS NOTE    Wendy Olson  OZH:[id number] DOB: 1976-10-09 DOA: 04/20/2024 PCP: Collins Dean, NP    Chief Complaint  Patient presents with   Hematuria    Brief Narrative:  Patient 48 year old female history of poorly controlled type 2 diabetes, hypertension, hyperlipidemia, GERD, sciatica presented with a 2-day history of left-sided flank pain, hematuria, dysuria, suprapubic abdominal pain.  Workup in the ED concerning for an acute pyelonephritis.  CT renal stone protocol negative for obstructing renal stones or hydronephrosis.  Patient placed on IV antibiotics and admitted for acute pyelonephritis.   Assessment & Plan:   Principal Problem:   Acute pyelonephritis Active Problems:   Type 2 diabetes mellitus with hyperglycemia, without long-term current use of insulin  (HCC)   Hypertension   Essential hypertension   Hyperlipidemia   GERD (gastroesophageal reflux disease)   Hematuria   Dehydration   Hyponatremia   Acidosis, metabolic   Constipation   Candidiasis of female genitalia   Hypophosphatemia  #1 acute pyelonephritis -Patient presented with a 2-day history of suprapubic abdominal pain, left flank pain worsening, hematuria, dysuria.  Urinalysis done consistent with a UTI. - CT renal stone protocol done negative for obstructing renal stones or hydronephrosis. - Check a urine culture. - Urine cultures with > 100,000 colonies of E. coli with sensitivities pending.   - Patient still with flank pain/bilateral CVA TTP.   - Continue IV Rocephin  pending sensitivities.   - Pain management, IV fluids, IV antiemetics, supportive care.    2.  Hematuria -Likely secondary to problem #1. -Improved.   3.  Uncontrolled diabetes mellitus type 2 - Hemoglobin A1c 11.6.  -Patient states has not taken her insulin  for a while due to financial constraints and cost. -CBG noted at 216 this morning. -Increase Semglee  to 24 units daily. -Start meal coverage NovoLog  4 units 3  times daily with meals. -Continue Neurontin . -SSI. -Continue to hold home oral hypoglycemic agents. -Due to financial constraints may need to switch to 70/30. -Diabetic coordinator consulted.   4.  Hyperlipidemia -Check a fasting lipid panel. -Continue home regimen statin.   5.  Hypertension - BP soft and as such we will hold lisinopril .   6.  Dehydration -IV fluids.   7.  Hyponatremia -Likely pseudohyponatremia secondary to hyperglycemia in the setting of dehydration. - Improved with hydration.    8.  Metabolic acidosis -Likely secondary to diabetes mellitus type 2 with hyperglycemia in the setting of an acute infection. - Acidosis improving with hydration. - Continue IV fluids, supportive care.   9.  GERD - Clinical improvement.   - Continue PPI twice daily.  10.  Probable vaginal candidiasis -Patient with complaints of vaginal itching, concerns for yeast infection. -Diflucan  150 mg p.o. x 1. -Clotrimazole  vaginal troches.  11.  Hypophosphatemia -Phosphorus at 1.9. -K-Phos 30 mmol IV x 1. -Repeat labs in the AM.     DVT prophylaxis: SCDs Code Status: Full Family Communication: Updated patient and boyfriend at bedside. Disposition: Home when clinically improved with finalization of urine culture results hopefully in the next 24 hours.  Status is: Observation The patient remains OBS appropriate and will d/c before 2 midnights.   Consultants:  None  Procedures:  CT renal stone protocol 04/20/2024  Antimicrobials:  Anti-infectives (From admission, onward)    Start     Dose/Rate Route Frequency Ordered Stop   04/21/24 1400  fluconazole  (DIFLUCAN ) tablet 150 mg        150 mg Oral  Once 04/21/24 1311 04/21/24  1454   04/20/24 1030  cefTRIAXone  (ROCEPHIN ) 2 g in sodium chloride  0.9 % 100 mL IVPB        2 g 200 mL/hr over 30 Minutes Intravenous Every 24 hours 04/20/24 1018           Subjective: Patient laying in bed.  States she feels a little bit better  however still with significant flank pain.  Patient with complaints of vaginal itching and concerns for yeast infection.  Patient denies any chest pain or shortness of breath.  Patient states has not been able to take her insulin  for a while now due to financial constraints/cost.  Objective: Vitals:   04/21/24 0100 04/21/24 0413 04/21/24 1006 04/21/24 1213  BP:  98/65 123/73 107/63  Pulse: 98 97 (!) 108 95  Resp:  16  18  Temp:  98.6 F (37 C) 99.8 F (37.7 C) 98.2 F (36.8 C)  TempSrc:  Oral Oral Oral  SpO2: 100% 100% 97% 98%  Weight:      Height:        Intake/Output Summary (Last 24 hours) at 04/21/2024 1502 Last data filed at 04/21/2024 1300 Gross per 24 hour  Intake 480 ml  Output --  Net 480 ml   Filed Weights   04/20/24 0745  Weight: 99.8 kg    Examination:  General exam: Appears calm and comfortable  Respiratory system: Clear to auscultation. Respiratory effort normal. Cardiovascular system: S1 & S2 heard, RRR. No JVD, murmurs, rubs, gallops or clicks. No pedal edema. Gastrointestinal system: Abdomen is nondistended, soft and tender to palpation in the suprapubic region.  Bilateral CVA TTP.  Positive bowel sounds.  No rebound.  No guarding.   Central nervous system: Alert and oriented. No focal neurological deficits. Extremities: Symmetric 5 x 5 power. Skin: No rashes, lesions or ulcers Psychiatry: Judgement and insight appear normal. Mood & affect appropriate.     Data Reviewed: I have personally reviewed following labs and imaging studies  CBC: Recent Labs  Lab 04/20/24 0820 04/21/24 0410  WBC 17.3* 11.6*  HGB 14.9 12.8  HCT 44.3 38.9  MCV 90.4 94.2  PLT 283 221    Basic Metabolic Panel: Recent Labs  Lab 04/20/24 0820 04/20/24 1208 04/21/24 0410  NA 127* 130* 132*  K 4.2 4.0 4.2  CL 96* 100 103  CO2 16* 20* 19*  GLUCOSE 359* 297* 288*  BUN 13 12 11   CREATININE 0.68 0.69 0.77  CALCIUM  8.8* 7.9* 8.2*  MG  --   --  1.9  PHOS  --   --  1.9*     GFR: Estimated Creatinine Clearance: 105.5 mL/min (by C-G formula based on SCr of 0.77 mg/dL).  Liver Function Tests: Recent Labs  Lab 04/21/24 0410  AST 19  ALT 29  ALKPHOS 176*  BILITOT 1.3*  PROT 6.1*  ALBUMIN 2.8*    CBG: Recent Labs  Lab 04/20/24 1700 04/20/24 2218 04/21/24 0737 04/21/24 1211  GLUCAP 311* 295* 260* 277*     Recent Results (from the past 240 hours)  Urine Culture (for pregnant, neutropenic or urologic patients or patients with an indwelling urinary catheter)     Status: Abnormal (Preliminary result)   Collection Time: 04/20/24  7:52 AM   Specimen: Urine, Clean Catch  Result Value Ref Range Status   Specimen Description   Final    URINE, CLEAN CATCH Performed at [facility], [address] [place], [place] [address]    Special Requests   Final  NONE Performed at [facility], [address] [place], [place] [address]    Culture (A)  Final    >=100,000 COLONIES/mL ESCHERICHIA COLI SUSCEPTIBILITIES TO FOLLOW Performed at [facility], [address], [place], [place] [address]    Report Status PENDING  Incomplete  Culture, blood (Routine X 2) w Reflex to ID Panel     Status: None (Preliminary result)   Collection Time: 04/20/24  5:56 PM   Specimen: BLOOD  Result Value Ref Range Status   Specimen Description BLOOD SITE NOT SPECIFIED  Final   Special Requests   Final    BOTTLES DRAWN AEROBIC ONLY Blood Culture results may not be optimal due to an inadequate volume of blood received in culture bottles   Culture   Final    NO GROWTH < 24 HOURS Performed at [facility], [address] [place], [place] [address]    Report Status PENDING  Incomplete  Culture, blood (Routine X 2) w Reflex to ID Panel     Status: None (Preliminary result)   Collection Time: 04/21/24  4:10 AM   Specimen: BLOOD  Result Value Ref Range Status   Specimen Description   Final    BLOOD BLOOD LEFT  ARM Performed at [facility], [address] [place], [place] [address]    Special Requests   Final    BOTTLES DRAWN AEROBIC AND ANAEROBIC Blood Culture results may not be optimal due to an inadequate volume of blood received in culture bottles Performed at [facility], [address], [place], [place] [address]    Culture   Final    NO GROWTH < 12 HOURS Performed at [facility], [address], [place], [place] [address]    Report Status PENDING  Incomplete         Radiology Studies: CT Renal Stone Study Result Date: 04/20/2024 CLINICAL DATA:  Flank pain.  Hematuria. EXAM: CT ABDOMEN AND PELVIS WITHOUT CONTRAST TECHNIQUE: Multidetector CT imaging of the abdomen and pelvis was performed following the standard protocol without IV contrast. RADIATION DOSE REDUCTION: This exam was performed according to the departmental dose-optimization program which includes automated exposure control, adjustment of the mA and/or kV according to patient size and/or use of iterative reconstruction technique. COMPARISON:  10/17/2018. FINDINGS: Lower chest: No acute abnormality. No pleural or pericardial effusions. Hepatobiliary: Hypodensity consistent with fatty infiltration. No focal liver abnormality is seen. No gallstones, gallbladder wall thickening, or biliary dilatation. Pancreas: Unremarkable. No pancreatic ductal dilatation or surrounding inflammatory changes. Spleen: Normal in size without focal abnormality. Adrenals/Urinary Tract: Adrenal glands are unremarkable. Kidneys are normal, without renal calculi, focal lesion, or hydronephrosis. Bladder is unremarkable. Stomach/Bowel: Stomach is within normal limits. Appendix appears normal. No evidence of bowel wall thickening, distention, or inflammatory changes. Vascular/Lymphatic: Aortic atherosclerosis. No enlarged abdominal or pelvic lymph nodes. Reproductive: Uterus and bilateral adnexa are unremarkable.  Other: No abdominal wall hernia or abnormality. No abdominopelvic ascites. Musculoskeletal: No acute or significant osseous findings. IMPRESSION: 1. Fatty infiltration of the liver. 2. No obstructing renal stones or hydronephrosis. 3. No acute abdominal or pelvic pathology identified. 4. Aortic atherosclerosis (ICD10-I70.0). Electronically Signed   By: Sydell Eva M.D.   On: 04/20/2024 10:06        Scheduled Meds:  atorvastatin   40 mg Oral Daily   bisacodyl   10 mg Rectal Once   clotrimazole  1 Applicatorful Vaginal QHS   gabapentin   300 mg Oral  TID   insulin  aspart  0-15 Units Subcutaneous TID WC   insulin  aspart  4 Units Subcutaneous TID WC   insulin  glargine-yfgn  24 Units Subcutaneous QHS   lidocaine   15 mL Oral Once   pantoprazole  40 mg Oral BID AC   polyethylene glycol  17 g Oral BID   senna-docusate  1 tablet Oral BID   sodium chloride  flush  3 mL Intravenous Q12H   Continuous Infusions:  sodium chloride  125 mL/hr at 04/21/24 0510   cefTRIAXone (ROCEPHIN)  IV 2 g (04/21/24 1018)   potassium PHOSPHATE IVPB (in mmol) 30 mmol (04/21/24 1253)     LOS: 0 days    Time spent: 40 minutes    Hilda Lovings, MD [facility]   To contact the attending provider between 7A-7P or the covering provider during after hours 7P-7A, please log into the web site [email or web address] and access using universal [place] password for that web site. If you do not have the password, please call the hospital operator.  04/21/2024, 3:02 PM

## 2024-04-21 NOTE — TOC Initial Note (Signed)
 Transition of Care [facility]) - Initial/Assessment Note    Patient Details  Name: Wendy Olson MRN: [id number] Date of Birth: 02-15-1976  Transition [facility]) CM/SW Contact:    Kathryn Parish, RN Phone Number: 04/21/2024, 2:06 PM  Clinical Narrative:                 PTA lives in a hotel with S.O Danney Dutton [phone]. PCP/insurance verified. No DME, HH, or oxygen. Patient mention concerns about cost of insulin . NCM informed patient to reachput to PCP to discuss further. Patients S.O. will transport home at discharge. TOC signing off.  Expected Discharge Plan:  [facility]) Barriers to Discharge: No Barriers Identified   Patient Goals and CMS Choice Patient states their goals for this hospitalization and ongoing recovery are:: Return to hotel with SO   Choice offered to / list presented to : NA      Expected Discharge Plan and Services In-house Referral: NA Discharge Planning Services: CM Consult Post Acute Care Choice: NA Living arrangements for the past 2 months: Hotel/Motel                 DME Arranged: N/A DME Agency: NA       HH Arranged: NA HH Agency: NA        Prior Living Arrangements/Services Living arrangements for the past 2 months: Hotel/Motel Lives with:: Significant Other Patient language and need for interpreter reviewed:: Yes Do you feel safe going back to the place where you live?: Yes      Need for Family Participation in Patient Care: Yes (Comment) Care giver support system in place?: Yes (comment) Current home services:  (NA) Criminal Activity/Legal Involvement Pertinent to Current Situation/Hospitalization: No - Comment as needed  Activities of Daily Living   ADL Screening (condition at time of admission) Independently performs ADLs?: Yes (appropriate for developmental age) Is the patient deaf or have difficulty hearing?: No Does the patient have difficulty seeing, even when wearing glasses/contacts?: No Does the patient have  difficulty concentrating, remembering, or making decisions?: No  Permission Sought/Granted Permission sought to share information with : Case Manager Permission granted to share information with : Yes, Verbal Permission Granted  Share Information with NAME: Case Manager           Emotional Assessment Appearance:: Appears stated age Attitude/Demeanor/Rapport: Engaged Affect (typically observed): Appropriate Orientation: : Oriented to Self, Oriented to Place, Oriented to  Time, Oriented to Situation Alcohol / Substance Use: Not Applicable Psych Involvement: No (comment)  Admission diagnosis:  Pyelonephritis [N12] Acute pyelonephritis [N10] Patient Active Problem List   Diagnosis Date Noted   Acute pyelonephritis 04/20/2024   Essential hypertension 04/20/2024   Hyperlipidemia 04/20/2024   GERD (gastroesophageal reflux disease) 04/20/2024   Hematuria 04/20/2024   Dehydration 04/20/2024   Hyponatremia 04/20/2024   Acidosis, metabolic 04/20/2024   Constipation 04/20/2024   Type 2 diabetes mellitus with diabetic polyneuropathy, with long-term current use of insulin  (HCC) 10/28/2023   Sore throat 12/30/2016   Hypertension 12/14/2015   Type 2 diabetes mellitus with hyperglycemia, without long-term current use of insulin  (HCC) 11/01/2015   PCP:  Collins Dean, NP Pharmacy:   [facility] [address] [place] [address] Phone: [phone] Fax: [phone]     Social Drivers of Health (SDOH) Social History: SDOH Screenings   Food Insecurity: No Food Insecurity (04/20/2024)  Housing: Low Risk  (04/20/2024)  Transportation Needs: No Transportation Needs (04/20/2024)  Utilities: Not  At Risk (04/20/2024)  Alcohol Screen: Low Risk  (10/27/2023)  Depression (PHQ2-9): Low Risk  (10/28/2023)  Financial Resource Strain: High Risk (10/27/2023)  Physical Activity: Insufficiently Active (10/27/2023)  Social Connections:  Moderately Isolated (04/20/2024)  Stress: Stress Concern Present (10/27/2023)  Tobacco Use: Medium Risk (04/20/2024)  Health Literacy: Adequate Health Literacy (10/28/2023)   SDOH Interventions:     Readmission Risk Interventions     No data to display

## 2024-04-22 ENCOUNTER — Other Ambulatory Visit: Payer: Self-pay

## 2024-04-22 ENCOUNTER — Inpatient Hospital Stay (HOSPITAL_COMMUNITY)

## 2024-04-22 DIAGNOSIS — N12 Tubulo-interstitial nephritis, not specified as acute or chronic: Secondary | ICD-10-CM

## 2024-04-22 DIAGNOSIS — N10 Acute tubulo-interstitial nephritis: Secondary | ICD-10-CM | POA: Diagnosis not present

## 2024-04-22 LAB — CBC
HCT: 36.1 % (ref 36.0–46.0)
Hemoglobin: 12.1 g/dL (ref 12.0–15.0)
MCH: 31 pg (ref 26.0–34.0)
MCHC: 33.5 g/dL (ref 30.0–36.0)
MCV: 92.6 fL (ref 80.0–100.0)
Platelets: 214 10*3/uL (ref 150–400)
RBC: 3.9 MIL/uL (ref 3.87–5.11)
RDW: 12.4 % (ref 11.5–15.5)
WBC: 7.4 10*3/uL (ref 4.0–10.5)
nRBC: 0 % (ref 0.0–0.2)

## 2024-04-22 LAB — RENAL FUNCTION PANEL
Albumin: 2.7 g/dL — ABNORMAL LOW (ref 3.5–5.0)
Anion gap: 7 (ref 5–15)
BUN: 13 mg/dL (ref 6–20)
CO2: 22 mmol/L (ref 22–32)
Calcium: 8.3 mg/dL — ABNORMAL LOW (ref 8.9–10.3)
Chloride: 102 mmol/L (ref 98–111)
Creatinine, Ser: 0.65 mg/dL (ref 0.44–1.00)
GFR, Estimated: >60 mL/min (ref >60)
Glucose, Bld: 267 mg/dL — ABNORMAL HIGH (ref 70–99)
Phosphorus: 2 mg/dL — ABNORMAL LOW (ref 2.5–4.6)
Potassium: 3.6 mmol/L (ref 3.5–5.1)
Sodium: 131 mmol/L — ABNORMAL LOW (ref 135–145)

## 2024-04-22 LAB — URINE CULTURE: Culture: >=100000 — AB

## 2024-04-22 LAB — LIPID PANEL
Cholesterol: 219 mg/dL — ABNORMAL HIGH (ref 0–200)
HDL: 25 mg/dL — ABNORMAL LOW (ref >40)
LDL Cholesterol: 154 mg/dL — ABNORMAL HIGH (ref 0–99)
Total CHOL/HDL Ratio: 8.8 ratio
Triglycerides: 201 mg/dL — ABNORMAL HIGH (ref <150)
VLDL: 40 mg/dL (ref 0–40)

## 2024-04-22 LAB — GLUCOSE, CAPILLARY
Glucose-Capillary: 259 mg/dL — ABNORMAL HIGH (ref 70–99)
Glucose-Capillary: 220 mg/dL — ABNORMAL HIGH (ref 70–99)

## 2024-04-22 MED ORDER — BLOOD GLUCOSE MONITOR SYSTEM W/DEVICE KIT
1.0000 | PACK | Freq: Three times a day (TID) | 0 refills | Status: AC
  Filled 2024-04-22: qty 1, 30d supply, fill #0

## 2024-04-22 MED ORDER — NOVOLIN 70/30 FLEXPEN RELION (70-30) 100 UNIT/ML ~~LOC~~ SUPN
16.0000 [IU] | PEN_INJECTOR | Freq: Two times a day (BID) | SUBCUTANEOUS | 0 refills | Status: DC
  Filled 2024-04-22: qty 15, 47d supply, fill #0

## 2024-04-22 MED ORDER — INSULIN PEN NEEDLE 31G X 5 MM MISC
1.0000 | Freq: Three times a day (TID) | 0 refills | Status: DC
  Filled 2024-04-22: qty 100, 30d supply, fill #0

## 2024-04-22 MED ORDER — LANCET DEVICE MISC
1.0000 | Freq: Three times a day (TID) | 0 refills | Status: AC
  Filled 2024-04-22: qty 1, fill #0

## 2024-04-22 MED ORDER — LANCETS MISC
1.0000 | Freq: Three times a day (TID) | 0 refills | Status: AC
  Filled 2024-04-22: qty 100, 33d supply, fill #0

## 2024-04-22 MED ORDER — KETOROLAC TROMETHAMINE 30 MG/ML IJ SOLN
30.0000 mg | Freq: Once | INTRAMUSCULAR | Status: AC
  Administered 2024-04-22: 30 mg via INTRAVENOUS
  Filled 2024-04-22: qty 1

## 2024-04-22 MED ORDER — BLOOD GLUCOSE TEST VI STRP
1.0000 | ORAL_STRIP | Freq: Three times a day (TID) | 0 refills | Status: AC
  Filled 2024-04-22: qty 100, 34d supply, fill #0

## 2024-04-22 MED ORDER — CEFADROXIL 500 MG PO CAPS
500.0000 mg | ORAL_CAPSULE | Freq: Two times a day (BID) | ORAL | 0 refills | Status: AC
  Filled 2024-04-22: qty 16, 8d supply, fill #0

## 2024-04-22 NOTE — Progress Notes (Signed)
 AVS reviewed w/ pt & s/o - both verbalized an understanding- PIV removed as noted- pt to pick up d/c meds at [facility]. Pt dressing for D/C to home. No other questions at this time

## 2024-04-22 NOTE — Discharge Summary (Signed)
 Physician Discharge Summary   Patient: Wendy Olson MRN: [id number] DOB: Dec 31, 1975  Admit date:     04/20/2024  Discharge date: 04/22/24  Discharge Physician: Cherylle Corwin   PCP: Collins Dean, NP   Recommendations at discharge:    Follow up with PC in 1-2 weeks  Discharge Diagnoses: Principal Problem:   Acute pyelonephritis Active Problems:   Type 2 diabetes mellitus with hyperglycemia, without long-term current use of insulin  (HCC)   Hypertension   Essential hypertension   Hyperlipidemia   GERD (gastroesophageal reflux disease)   Hematuria   Dehydration   Hyponatremia   Acidosis, metabolic   Constipation   Candidiasis of female genitalia   Hypophosphatemia  Resolved Problems:   * No resolved hospital problems. *  Hospital Course: 48 year old female history of poorly controlled type 2 diabetes, hypertension, hyperlipidemia, GERD, sciatica presented with a 2-day history of left-sided flank pain, hematuria, dysuria, suprapubic abdominal pain. Workup in the ED concerning for an acute pyelonephritis. CT renal stone protocol negative for obstructing renal stones or hydronephrosis. Patient placed on IV antibiotics and admitted for acute pyelonephritis.   Assessment and Plan: #1 acute pyelonephritis -Patient presented with a 2-day history of suprapubic abdominal pain, left flank pain worsening, hematuria, dysuria.  Urinalysis done consistent with a UTI. - CT renal stone protocol done negative for obstructing renal stones or hydronephrosis. - Urine cultures with > 100,000 colonies of E. coli that is pan-sensitive -was continued on IV Rocephin. Pt to complete cefcefadroxil on d/c to complete total 10 days - Pain management, IV fluids, IV antiemetics, supportive care.    2.  Hematuria -Likely secondary to problem #1. -Improved.   3.  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  -Patient states has not taken her insulin  for a while due  to financial constraints and cost. -CBG noted at 216 this morning. -Increase Semglee  to 24 units daily. -Start meal coverage NovoLog  4 units 3 times daily with meals. -Continue Neurontin  for neuropathy. Pt did complain of RLE numbness and later tingling. Ordered and reviewed head CT, unremarkable -Due to financial constraints, switched to 70/30 insulin  at 16 U bid per Diabetic Coordinator recs   4.  Hyperlipidemia -Check a fasting lipid panel. -Continue home regimen statin.   5.  Hypertension - cont home meds on d/c   6.  Dehydration -IV fluids.   7.  Hyponatremia -Likely pseudohyponatremia secondary to hyperglycemia in the setting of dehydration. - Improved with hydration.    8.  Metabolic acidosis -Likely secondary to diabetes mellitus type 2 with hyperglycemia in the setting of an acute infection. - Acidosis improving with hydration.   9.  GERD - Clinical improvement.   - Continue PPI twice daily.   10.  Probable vaginal candidiasis -Patient with complaints of vaginal itching, concerns for yeast infection. -Diflucan  150 mg p.o. x 1. -Clotrimazole vaginal troches this visit   11.  Hypophosphatemia -Given replacement        Consultants:  Procedures performed:   Disposition: Home Diet recommendation:  Carb modified diet DISCHARGE MEDICATION: Allergies as of 04/22/2024       Reactions   Lobster [shellfish Allergy] Anaphylaxis, Other (See Comments)   Just lobster    Reglan  [metoclopramide ]    Akathisia is a movement disorder characterized by a strong urge to move, often accompanied by feelings of restlessness, anxiety, and discomfort.        Medication List     STOP taking these medications  Benadryl  Allergy 25 mg capsule Generic drug: diphenhydrAMINE    empagliflozin  10 MG Tabs tablet Commonly known as: Jardiance    FreeStyle Libre 14 Day Reader [employer] 14 Day Sensor Misc   glipiZIDE  5 MG tablet Commonly known as: GLUCOTROL     Lantus  SoloStar 100 UNIT/ML Solostar Pen Generic drug: insulin  glargine   lidocaine  5 % Commonly known as: Lidoderm        TAKE these medications    Accu-Chek Guide Test test strip Generic drug: glucose blood Use to check blood sugar three (3) times daily. What changed:  how much to take how to take this when to take this additional instructions   Accu-Chek Softclix Lancets lancets Use one (1) lancet three (3) times daily to check blood sugar What changed:  how much to take how to take this when to take this additional instructions   atorvastatin  40 MG tablet Commonly known as: LIPITOR Take 1 tablet (40 mg total) by mouth daily.   Blood Glucose Monitor System w/Device Kit Use as directed for checking blood sugar   cefadroxil 500 MG capsule Commonly known as: DURICEF Take 1 capsule (500 mg total) by mouth 2 (two) times daily for 8 days.   gabapentin  300 MG capsule Commonly known as: NEURONTIN  Take 1 capsule (300 mg total) by mouth 3 (three) times daily. What changed:  when to take this reasons to take this   HumuLIN 70/30 KwikPen (70-30) 100 UNIT/ML KwikPen Generic drug: insulin  isophane & regular human KwikPen Inject 16 Units into the skin 2 (two) times daily with a meal.   Insulin  Pen Needle 31G X 5 MM Misc Use one (1) needle to inject insulin  3 (three) times daily. What changed:  how much to take how to take this when to take this additional instructions   Lancet Device Misc 1 each by Does not apply route 3 (three) times daily. May dispense any manufacturer covered by patient's insurance.   lisinopril  20 MG tablet Commonly known as: ZESTRIL  Take 1 tablet (20 mg total) by mouth daily.   Tylenol  8 Hour Arthritis Pain 650 MG CR tablet Generic drug: acetaminophen  Take 1,300 mg by mouth every 8 (eight) hours as needed for pain.        Follow-up Information     Collins Dean, NP Follow up in 2 week(s).   Specialty: Nurse Practitioner Why:  Hospital follow up Contact information: [address] [place] [address] [phone]                Discharge Exam: [name] Weights   04/20/24 0745  Weight: 99.8 kg   General exam: Awake, laying in bed, in nad Respiratory system: Normal respiratory effort, no wheezing Cardiovascular system: regular rate, s1, s2 Gastrointestinal system: Soft, nondistended, positive BS Central nervous system: CN2-12 grossly intact, strength intact Extremities: Perfused, no clubbing Skin: Normal skin turgor, no notable skin lesions seen Psychiatry: Mood normal // no visual hallucinations   Condition at discharge: fair  The results of significant diagnostics from this hospitalization (including imaging, microbiology, ancillary and laboratory) are listed below for reference.   Imaging Studies: CT HEAD WO CONTRAST ( ) Result Date: 04/22/2024 CLINICAL DATA:  Headache, sudden, severe. EXAM: CT HEAD WITHOUT CONTRAST TECHNIQUE: Contiguous axial images were obtained from the base of the skull through the vertex without intravenous contrast. RADIATION DOSE REDUCTION: This exam was performed according to the departmental dose-optimization program which includes automated exposure control, adjustment of the mA and/or kV according to patient size  and/or use of iterative reconstruction technique. COMPARISON:  Head CT and MRI 09/13/2023 FINDINGS: Brain: There is no evidence of an acute infarct, intracranial hemorrhage, mass, midline shift, or extra-axial fluid collection. Cerebral volume is normal. The ventricles are normal in size. Vascular: Calcified atherosclerosis at the skull base. No hyperdense vessel. Skull: No acute fracture or suspicious lesion. Sinuses/Orbits: Visualized paranasal sinuses are clear. Minimal, chronic appearing left mastoid air cell opacification. Unremarkable orbits. Other: None. IMPRESSION: No evidence of acute intracranial abnormality. Electronically Signed   By: Aundra Lee M.D.    On: 04/22/2024 11:22   CT Renal Stone Study Result Date: 04/20/2024 CLINICAL DATA:  Flank pain.  Hematuria. EXAM: CT ABDOMEN AND PELVIS WITHOUT CONTRAST TECHNIQUE: Multidetector CT imaging of the abdomen and pelvis was performed following the standard protocol without IV contrast. RADIATION DOSE REDUCTION: This exam was performed according to the departmental dose-optimization program which includes automated exposure control, adjustment of the mA and/or kV according to patient size and/or use of iterative reconstruction technique. COMPARISON:  10/17/2018. FINDINGS: Lower chest: No acute abnormality. No pleural or pericardial effusions. Hepatobiliary: Hypodensity consistent with fatty infiltration. No focal liver abnormality is seen. No gallstones, gallbladder wall thickening, or biliary dilatation. Pancreas: Unremarkable. No pancreatic ductal dilatation or surrounding inflammatory changes. Spleen: Normal in size without focal abnormality. Adrenals/Urinary Tract: Adrenal glands are unremarkable. Kidneys are normal, without renal calculi, focal lesion, or hydronephrosis. Bladder is unremarkable. Stomach/Bowel: Stomach is within normal limits. Appendix appears normal. No evidence of bowel wall thickening, distention, or inflammatory changes. Vascular/Lymphatic: Aortic atherosclerosis. No enlarged abdominal or pelvic lymph nodes. Reproductive: Uterus and bilateral adnexa are unremarkable. Other: No abdominal wall hernia or abnormality. No abdominopelvic ascites. Musculoskeletal: No acute or significant osseous findings. IMPRESSION: 1. Fatty infiltration of the liver. 2. No obstructing renal stones or hydronephrosis. 3. No acute abdominal or pelvic pathology identified. 4. Aortic atherosclerosis (ICD10-I70.0). Electronically Signed   By: Sydell Eva M.D.   On: 04/20/2024 10:06    Microbiology: Results for orders placed or performed during the hospital encounter of 04/20/24  Urine Culture (for pregnant,  neutropenic or urologic patients or patients with an indwelling urinary catheter)     Status: Abnormal   Collection Time: 04/20/24  7:52 AM   Specimen: Urine, Clean Catch  Result Value Ref Range Status   Specimen Description   Final    URINE, CLEAN CATCH Performed at [facility], [address], [place], [place] [address]    Special Requests   Final    NONE Performed at [facility], [address], [place], [place] [address]    Culture >=100,000 COLONIES/mL ESCHERICHIA COLI (A)  Final   Report Status 04/22/2024 FINAL  Final   Organism ID, Bacteria ESCHERICHIA COLI (A)  Final      Susceptibility   Escherichia coli - MIC*    AMPICILLIN 4 SENSITIVE Sensitive     CEFAZOLIN <=4 SENSITIVE Sensitive     CEFEPIME <=0.12 SENSITIVE Sensitive     CEFTRIAXONE  <=0.25 SENSITIVE Sensitive     CIPROFLOXACIN <=0.25 SENSITIVE Sensitive     GENTAMICIN <=1 SENSITIVE Sensitive     IMIPENEM <=0.25 SENSITIVE Sensitive     NITROFURANTOIN  <=16 SENSITIVE Sensitive     TRIMETH /SULFA  <=20 SENSITIVE Sensitive     AMPICILLIN/SULBACTAM <=2 SENSITIVE Sensitive     PIP/TAZO <=4 SENSITIVE Sensitive ug/mL    * >=100,000 COLONIES/mL ESCHERICHIA COLI  Culture, blood (Routine X 2) w Reflex to ID Panel     Status:  None (Preliminary result)   Collection Time: 04/20/24  5:56 PM   Specimen: BLOOD  Result Value Ref Range Status   Specimen Description BLOOD SITE NOT SPECIFIED  Final   Special Requests   Final    BOTTLES DRAWN AEROBIC ONLY Blood Culture results may not be optimal due to an inadequate volume of blood received in culture bottles   Culture   Final    NO GROWTH 2 DAYS Performed at [facility], [address] [place], [place] [address]    Report Status PENDING  Incomplete  Culture, blood (Routine X 2) w Reflex to ID Panel     Status: None (Preliminary result)   Collection Time: 04/21/24  4:10 AM   Specimen: BLOOD  Result Value Ref Range Status   Specimen  Description   Final    BLOOD BLOOD LEFT ARM Performed at [facility], [address] [place], [place] [address]    Special Requests   Final    BOTTLES DRAWN AEROBIC AND ANAEROBIC Blood Culture results may not be optimal due to an inadequate volume of blood received in culture bottles Performed at [facility], [address], [place], [place] [address]    Culture   Final    NO GROWTH 1 DAY Performed at [facility], [address], [place], [place] [address]    Report Status PENDING  Incomplete    Labs: CBC: Recent Labs  Lab 04/20/24 0820 04/21/24 0410 04/22/24 0442  WBC 17.3* 11.6* 7.4  HGB 14.9 12.8 12.1  HCT 44.3 38.9 36.1  MCV 90.4 94.2 92.6  PLT 283 221 214   Basic Metabolic Panel: Recent Labs  Lab 04/20/24 0820 04/20/24 1208 04/21/24 0410 04/22/24 0442  NA 127* 130* 132* 131*  K 4.2 4.0 4.2 3.6  CL 96* 100 103 102  CO2 16* 20* 19* 22  GLUCOSE 359* 297* 288* 267*  BUN 13 12 11 13   CREATININE 0.68 0.69 0.77 0.65  CALCIUM  8.8* 7.9* 8.2* 8.3*  MG  --   --  1.9  --   PHOS  --   --  1.9* 2.0*   Liver Function Tests: Recent Labs  Lab 04/21/24 0410 04/22/24 0442  AST 19  --   ALT 29  --   ALKPHOS 176*  --   BILITOT 1.3*  --   PROT 6.1*  --   ALBUMIN 2.8* 2.7*   CBG: Recent Labs  Lab 04/21/24 1211 04/21/24 1706 04/21/24 2043 04/22/24 0742 04/22/24 1158  GLUCAP 277* 275* 273* 259* 220*    Discharge time spent: less than 30 minutes.  Signed: Cherylle Corwin, MD [facility] 04/22/2024

## 2024-04-22 NOTE — Plan of Care (Signed)

## 2024-04-23 ENCOUNTER — Telehealth: Payer: Self-pay

## 2024-04-23 NOTE — Telephone Encounter (Signed)
 I noted that the prescription for gabapentin  was written 12/2021.  I called the patient and left a message requesting a call back.  I want to let her know that I sent a message to PCP regarding the expired gabapentin 

## 2024-04-23 NOTE — Transitions of Care (Post Inpatient/ED Visit) (Signed)
 04/23/2024  Name: Wendy Olson MRN: [id number] DOB: 13-Jan-1976  Today's TOC FU Call Status: Today's TOC FU Call Status:: Successful TOC FU Call Completed TOC FU Call Complete Date: 04/23/24 Patient's Name and Date of Birth confirmed.  Transition Care Management Follow-up Telephone Call Date of Discharge: 04/22/24 Discharge Facility: [facility]) Type of Discharge: Inpatient Admission Primary Inpatient Discharge Diagnosis:: acute pyelonephritis How have you been since you were released from the hospital?: Better Any questions or concerns?: Yes Patient Questions/Concerns:: She said she is feeling better except for the RLE numbness from her knee to foot.  She stated that they told her in the hospital that its probably due to neuropathy. She stated she does not have the gabapentin  and after speaking with her I noted that the prescription was written in 01/08/2022. Patient Questions/Concerns Addressed: Notified Provider of Patient Questions/Concerns  Items Reviewed: Did you receive and understand the discharge instructions provided?: Yes Medications obtained,verified, and reconciled?: Yes (Medications Reviewed) (She has a glucometer. Blood sugar this morning : 216.  She is missing atorvastatin ,lisinopril ,gabapentin . I epxlained that if she is not able to afford copays for meds, she needs to let the pharmacy know as they can waive most copays with her Medicaid) Any new allergies since your discharge?: No Dietary orders reviewed?: Yes Type of Diet Ordered:: heart healthy, diabetic Do you have support at home?: Yes Name of Support/Comfort Primary Source: She said that someone is with her during the day but she is alone at night  Medications Reviewed Today: Medications Reviewed Today     Reviewed by Burnett Carson, RN (Case Manager) on 04/23/24 at 1349  Med List Status: <None>   Medication Order Taking? Sig Documenting Provider Last Dose Status Informant  atorvastatin  (LIPITOR) 40 MG  tablet [id number] No Take 1 tablet (40 mg total) by mouth daily. Collins Dean, NP 04/17/2024 Active Self           Med Note Normie Becton, Averill Winters   Thu Apr 23, 2024  1:45 PM) She does not have this  Blood Glucose Monitoring Suppl (BLOOD GLUCOSE MONITOR SYSTEM) w/Device KIT [id number]  Use as directed for checking blood sugar Oral Billings, MD  Active   cefadroxil (DURICEF) 500 MG capsule [id number]  Take 1 capsule (500 mg total) by mouth 2 (two) times daily for 8 days. Oral Billings, MD  Active   gabapentin  (NEURONTIN ) 300 MG capsule [id number] No Take 1 capsule (300 mg total) by mouth 3 (three) times daily.  Patient taking differently: Take 300 mg by mouth 3 (three) times daily as needed (for neuropathy).   Collins Dean, NP Unknown Active Self           Med Note Burnett Carson   Thu Apr 23, 2024  1:46 PM) She stated she does not have this  Glucose Blood (BLOOD GLUCOSE TEST STRIPS) STRP [id number]  Use to check blood sugar three (3) times daily. Oral Billings, MD  Active   insulin  isophane & regular human KwikPen (NOVOLIN 70/30 KWIKPEN) (70-30) 100 UNIT/ML KwikPen [id number]  Inject 16 Units into the skin 2 (two) times daily with a meal. Oral Billings, MD  Active   Insulin  Pen Needle 31G X 5 MM MISC [id number]  Use one (1) needle to inject insulin  3 (three) times daily. Oral Billings, MD  Active   Lancet Device MISC [id number]  1 each by Does not apply route 3 (three) times daily. May dispense any manufacturer covered by patient's insurance. Joan Mouton,  Carnell Christian, MD  Active   Lancets MISC [id number]  Use one (1) lancet three (3) times daily to check blood sugar Oral Billings, MD  Active   lisinopril  (ZESTRIL ) 20 MG tablet [id number] No Take 1 tablet (20 mg total) by mouth daily. Collins Dean, NP 04/17/2024 Active Self           Med Note Normie Becton, Kade Demicco   Thu Apr 23, 2024  1:49 PM) She said she doesn't have this   TYLENOL  8 HOUR ARTHRITIS PAIN 650 MG CR tablet [id number] No Take 1,300 mg by mouth  every 8 (eight) hours as needed for pain. [provider] Unknown Active Self            Home Care and Equipment/Supplies: Were Home Health Services Ordered?: No Any new equipment or medical supplies ordered?: No  Functional Questionnaire: Do you need assistance with bathing/showering or dressing?: No Do you need assistance with meal preparation?: No Do you need assistance with eating?: No Do you have difficulty maintaining continence: No Do you need assistance with getting out of bed/getting out of a chair/moving?: No Do you have difficulty managing or taking your medications?: No  Follow up appointments reviewed: PCP Follow-up appointment confirmed?: Yes Date of PCP follow-up appointment?: 06/08/24 Follow-up Provider: Winda Hastings, NP Specialist Hospital Follow-up appointment confirmed?: NA Do you need transportation to your follow-up appointment?: No Do you understand care options if your condition(s) worsen?: Yes-patient verbalized understanding    SIGNATURE Burnett Carson, RN

## 2024-04-23 NOTE — Telephone Encounter (Signed)
 From [facility] call:  She said she is feeling better except for the RLE numbness from her knee to foot. She stated that they told her in the hospital that its probably due to neuropathy. She stated she does not have the gabapentin  and after speaking with her I noted that the prescription was written in 01/08/2022.   She is also missing atorvastatin  and lisinopril . I epxlained that if she is not able to afford copays for meds, she needs to let the pharmacy know as they can waive most copays with her Medicaid.

## 2024-04-25 ENCOUNTER — Other Ambulatory Visit: Payer: Self-pay | Admitting: Nurse Practitioner

## 2024-04-25 ENCOUNTER — Other Ambulatory Visit: Payer: Self-pay

## 2024-04-25 DIAGNOSIS — E1142 Type 2 diabetes mellitus with diabetic polyneuropathy: Secondary | ICD-10-CM

## 2024-04-25 LAB — CULTURE, BLOOD (ROUTINE X 2): Culture: NO GROWTH

## 2024-04-26 ENCOUNTER — Other Ambulatory Visit: Payer: Self-pay | Admitting: Nurse Practitioner

## 2024-04-26 DIAGNOSIS — Z794 Long term (current) use of insulin: Secondary | ICD-10-CM

## 2024-04-26 DIAGNOSIS — E785 Hyperlipidemia, unspecified: Secondary | ICD-10-CM

## 2024-04-26 DIAGNOSIS — E1142 Type 2 diabetes mellitus with diabetic polyneuropathy: Secondary | ICD-10-CM

## 2024-04-26 DIAGNOSIS — I10 Essential (primary) hypertension: Secondary | ICD-10-CM

## 2024-04-26 DIAGNOSIS — E1165 Type 2 diabetes mellitus with hyperglycemia: Secondary | ICD-10-CM

## 2024-04-26 LAB — CULTURE, BLOOD (ROUTINE X 2): Culture: NO GROWTH

## 2024-04-26 MED ORDER — GABAPENTIN 300 MG PO CAPS
300.0000 mg | ORAL_CAPSULE | Freq: Three times a day (TID) | ORAL | 3 refills | Status: DC
  Filled 2024-04-26: qty 90, 30d supply, fill #0
  Filled 2024-05-23: qty 90, 30d supply, fill #1

## 2024-04-26 MED ORDER — LISINOPRIL 20 MG PO TABS
20.0000 mg | ORAL_TABLET | Freq: Every day | ORAL | 1 refills | Status: DC
  Filled 2024-04-26: qty 90, 90d supply, fill #0
  Filled 2024-05-23 – 2024-05-28 (×2): qty 90, 90d supply, fill #1

## 2024-04-26 MED ORDER — ATORVASTATIN CALCIUM 40 MG PO TABS
40.0000 mg | ORAL_TABLET | Freq: Every day | ORAL | 1 refills | Status: DC
  Filled 2024-04-26: qty 90, 90d supply, fill #0
  Filled 2024-05-23 – 2024-05-28 (×3): qty 90, 90d supply, fill #1

## 2024-04-26 NOTE — Telephone Encounter (Signed)
 Medications have been filled

## 2024-04-27 ENCOUNTER — Other Ambulatory Visit: Payer: Self-pay

## 2024-05-23 ENCOUNTER — Other Ambulatory Visit: Payer: Self-pay

## 2024-05-25 ENCOUNTER — Other Ambulatory Visit: Payer: Self-pay

## 2024-05-26 ENCOUNTER — Other Ambulatory Visit: Payer: Self-pay

## 2024-05-28 ENCOUNTER — Other Ambulatory Visit: Payer: Self-pay

## 2024-06-05 ENCOUNTER — Telehealth: Payer: Self-pay | Admitting: Nurse Practitioner

## 2024-06-05 ENCOUNTER — Other Ambulatory Visit: Payer: Self-pay

## 2024-06-05 NOTE — Telephone Encounter (Signed)
 Called pt to confirm appt. Pt will be present.

## 2024-06-08 ENCOUNTER — Other Ambulatory Visit: Payer: Self-pay

## 2024-06-08 ENCOUNTER — Ambulatory Visit: Attending: Nurse Practitioner | Admitting: Nurse Practitioner

## 2024-06-08 ENCOUNTER — Encounter: Payer: Self-pay | Admitting: Nurse Practitioner

## 2024-06-08 VITALS — BP 136/90 | HR 125 | Resp 19 | Ht 67.0 in | Wt 212.2 lb

## 2024-06-08 DIAGNOSIS — E1142 Type 2 diabetes mellitus with diabetic polyneuropathy: Secondary | ICD-10-CM

## 2024-06-08 DIAGNOSIS — I10 Essential (primary) hypertension: Secondary | ICD-10-CM | POA: Diagnosis not present

## 2024-06-08 DIAGNOSIS — Z794 Long term (current) use of insulin: Secondary | ICD-10-CM | POA: Diagnosis not present

## 2024-06-08 DIAGNOSIS — E785 Hyperlipidemia, unspecified: Secondary | ICD-10-CM | POA: Diagnosis not present

## 2024-06-08 DIAGNOSIS — K089 Disorder of teeth and supporting structures, unspecified: Secondary | ICD-10-CM

## 2024-06-08 DIAGNOSIS — E119 Type 2 diabetes mellitus without complications: Secondary | ICD-10-CM | POA: Diagnosis not present

## 2024-06-08 DIAGNOSIS — N12 Tubulo-interstitial nephritis, not specified as acute or chronic: Secondary | ICD-10-CM

## 2024-06-08 DIAGNOSIS — R000 Tachycardia, unspecified: Secondary | ICD-10-CM

## 2024-06-08 DIAGNOSIS — Z23 Encounter for immunization: Secondary | ICD-10-CM

## 2024-06-08 MED ORDER — INSULIN PEN NEEDLE 31G X 5 MM MISC
1.0000 | Freq: Three times a day (TID) | 0 refills | Status: DC
  Filled 2024-06-08: qty 100, 30d supply, fill #0

## 2024-06-08 MED ORDER — GABAPENTIN 300 MG PO CAPS
300.0000 mg | ORAL_CAPSULE | Freq: Three times a day (TID) | ORAL | 3 refills | Status: DC
  Filled 2024-06-08: qty 90, 30d supply, fill #0
  Filled 2024-06-22 – 2024-07-06 (×3): qty 90, 30d supply, fill #1
  Filled 2024-08-08: qty 90, 30d supply, fill #2
  Filled 2024-09-04 – 2024-09-11 (×2): qty 90, 30d supply, fill #3

## 2024-06-08 MED ORDER — DEXCOM G7 SENSOR MISC
6 refills | Status: DC
  Filled 2024-06-08: qty 3, 30d supply, fill #0
  Filled 2024-06-22 – 2024-07-05 (×3): qty 3, 30d supply, fill #1
  Filled 2024-07-21 – 2024-07-26 (×4): qty 3, 30d supply, fill #2
  Filled 2024-08-19: qty 3, 30d supply, fill #3
  Filled 2024-09-04 – 2024-09-12 (×3): qty 3, 30d supply, fill #4
  Filled 2024-10-01 – 2024-10-07 (×3): qty 3, 30d supply, fill #5
  Filled 2024-10-26 (×2): qty 3, 30d supply, fill #6
  Filled 2024-10-29: qty 3, 30d supply, fill #0
  Filled 2024-10-29: qty 3, 30d supply, fill #6

## 2024-06-08 MED ORDER — DEXCOM G7 RECEIVER DEVI
0 refills | Status: DC
  Filled 2024-06-08: qty 1, 30d supply, fill #0

## 2024-06-08 MED ORDER — METOPROLOL SUCCINATE ER 25 MG PO TB24
25.0000 mg | ORAL_TABLET | Freq: Every day | ORAL | 3 refills | Status: AC
  Filled 2024-06-08: qty 90, 90d supply, fill #0
  Filled 2024-06-22 – 2024-09-01 (×7): qty 90, 90d supply, fill #1
  Filled 2024-12-04: qty 90, 90d supply, fill #2

## 2024-06-08 MED ORDER — ATORVASTATIN CALCIUM 40 MG PO TABS
40.0000 mg | ORAL_TABLET | Freq: Every day | ORAL | 1 refills | Status: AC
  Filled 2024-06-08 – 2024-07-21 (×4): qty 90, 90d supply, fill #0
  Filled 2024-07-24 – 2024-10-25 (×7): qty 90, 90d supply, fill #1

## 2024-06-08 MED ORDER — NOVOLIN 70/30 FLEXPEN RELION (70-30) 100 UNIT/ML ~~LOC~~ SUPN
16.0000 [IU] | PEN_INJECTOR | Freq: Two times a day (BID) | SUBCUTANEOUS | 6 refills | Status: AC
  Filled 2024-06-08: qty 15, 47d supply, fill #0
  Filled 2024-07-16: qty 15, 47d supply, fill #1
  Filled 2024-08-08 – 2024-09-01 (×2): qty 15, 47d supply, fill #2
  Filled 2024-10-01 – 2024-10-07 (×3): qty 15, 47d supply, fill #3
  Filled 2024-12-07: qty 15, 47d supply, fill #4

## 2024-06-08 MED ORDER — LISINOPRIL 20 MG PO TABS
20.0000 mg | ORAL_TABLET | Freq: Every day | ORAL | 1 refills | Status: DC
  Filled 2024-06-08 – 2024-07-06 (×4): qty 90, 90d supply, fill #0
  Filled 2024-07-16: qty 30, 30d supply, fill #0
  Filled 2024-07-21: qty 90, 90d supply, fill #0
  Filled 2024-08-08 – 2024-10-21 (×4): qty 90, 90d supply, fill #1

## 2024-06-08 MED ORDER — FREESTYLE LIBRE 3 PLUS SENSOR MISC
6 refills | Status: DC
  Filled 2024-06-08: qty 2, fill #0
  Filled 2024-06-08: qty 2, 28d supply, fill #0
  Filled 2024-06-12: qty 2, 30d supply, fill #0
  Filled 2024-06-17: qty 2, 28d supply, fill #0
  Filled 2024-06-22: qty 2, fill #0
  Filled 2024-07-21 – 2024-08-08 (×3): qty 2, 30d supply, fill #0

## 2024-06-08 MED ORDER — FREESTYLE LIBRE 3 READER DEVI
0 refills | Status: DC
  Filled 2024-06-08: qty 1, 1d supply, fill #0
  Filled 2024-06-08: qty 1, fill #0
  Filled 2024-06-12: qty 1, 90d supply, fill #0
  Filled 2024-06-17: qty 1, 365d supply, fill #0
  Filled 2024-06-22: qty 1, fill #0
  Filled 2024-07-21 – 2024-07-23 (×2): qty 1, 30d supply, fill #0

## 2024-06-08 NOTE — Progress Notes (Signed)
 Assessment & Plan:  Wendy Olson was seen today for hospitalization follow-up.  Diagnoses and all orders for this visit:  Primary hypertension -     lisinopril  (ZESTRIL ) 20 MG tablet; Take 1 tablet (20 mg total) by mouth daily. Continue all antihypertensives as prescribed.  Reminded to bring in blood pressure log for follow  up appointment.  RECOMMENDATIONS: DASH/Mediterranean Diets are healthier choices for HTN.    Diabetes mellitus with insulin  therapy  Diabetes with recent medication non-adherence. Currently on Novolin ; may switch to Humulin  due to cost. Elevated heart rate possibly due to hyperglycemia. Discussed continuous glucose monitoring options. - Prescribe Novolin  or Humulin  based on cost. - Order continuous glucose monitor (Libre or Dexcom) depending on insurance coverage. -     atorvastatin  (LIPITOR) 40 MG tablet; Take 1 tablet (40 mg total) by mouth daily. -     Insulin  Pen Needle 31G X 5 MM MISC; Use one (1) needle to inject insulin  3 (three) times daily. -     Ambulatory referral to Ophthalmology -     insulin  isophane & regular human KwikPen (NOVOLIN  70/30 KWIKPEN) (70-30) 100 UNIT/ML KwikPen; Inject 16 Units into the skin 2 (two) times daily with a meal. -     Continuous Glucose Sensor (DEXCOM G7 SENSOR) MISC; Check blood glucose levels continuously.  E11.65  Z79.4 -     Continuous Glucose Receiver (DEXCOM G7 RECEIVER) DEVI; Check blood glucose levels continuously.  E11.65  Z79.4 -     CMP14+EGFR -     Continuous Glucose Sensor (FREESTYLE LIBRE 3 SENSOR) MISC; Check blood glucose levels continuously.  E11.65  Z79.4 -     Continuous Glucose Receiver (FREESTYLE LIBRE 3 READER) DEVI; Check blood glucose levels continuously.  Dyslipidemia, goal LDL below 70 -     atorvastatin  (LIPITOR) 40 MG tablet; Take 1 tablet (40 mg total) by mouth daily. INSTRUCTIONS: Work on a low fat, heart healthy diet and participate in regular aerobic exercise program by working out at least 150  minutes per week; 5 days a week-30 minutes per day. Avoid red meat/beef/steak,  fried foods. junk foods, sodas, sugary drinks, unhealthy snacking, alcohol and smoking.  Drink at least 80 oz of water per day and monitor your carbohydrate intake daily.    Type 2 diabetes mellitus with diabetic polyneuropathy, with long-term current use of insulin  (HCC) -     gabapentin  (NEURONTIN ) 300 MG capsule; Take 1 capsule (300 mg total) by mouth 3 (three) times daily.  Need for pneumococcal 20-valent conjugate vaccination -     Pneumococcal conjugate vaccine 20-valent  Poor dentition -     Ambulatory referral to Dentistry  Tachycardia -     Thyroid  Panel With TSH -     metoprolol  succinate (TOPROL -XL) 25 MG 24 hr tablet; Take 1 tablet (25 mg total) by mouth daily.  Pyelonephritis No dysuria or flank pain. - Order urinalysis and urine culture.  Tachycardia Persistent elevated heart rate, possibly related to diabetes or stress. No palpitations or excessive caffeine  intake. Discussed propranolol for symptom management. - Recheck blood pressure and heart rate. - Consider prescribing propranolol if heart rate remains elevated. Thyroid  function not checked in four years. - Order thyroid  function tests.  Patient has been counseled on age-appropriate routine health concerns for screening and prevention. These are reviewed and up-to-date. Referrals have been placed accordingly. Immunizations are up-to-date or declined.    Subjective:   Chief Complaint  Patient presents with   Hospitalization Follow-up    Wendy Olson  Olson 48 y.o. female presents to office today for hospital follow-up.  She is accompanied by her companion  She has been out of her medications for a few weeks now and I have not seen her in the office since last year.   She has a past medical history of Anxiety, Asthma, DM2, Diabetic neuropathy, Hypertension,  HPL, and Seasonal allergies.    HFU She was admitted to hospital on Apr 20, 2024 and discharged on Apr 22, 2024.   Per hospital discharge note: 48 year old female history of poorly controlled type 2 diabetes, hypertension, hyperlipidemia, GERD, sciatica presented with a 2-day history of left-sided flank pain, hematuria, dysuria, suprapubic abdominal pain. Workup in the ED concerning for an acute pyelonephritis. CT renal stone protocol negative for obstructing renal stones or hydronephrosis. Patient placed on IV antibiotics and admitted for acute pyelonephritis.  CT renal stone protocol done negative for obstructing renal stones or hydronephrosis. - Urine cultures with > 100,000 colonies of E. coli that is pan-sensitive -was continued on IV Rocephin . Pt to complete cefcefadroxil on d/c to complete total 10 days - Pain management, IV fluids, IV antiemetics, supportive care.  Hemoglobin A1c 11.6.  Due to financial constraints, switched to 70/30 insulin  at 16 U bid per Diabetic Coordinator recs  Patient with complaints of vaginal itching, concerns for yeast infection. -Diflucan  150 mg p.o. x 1. -Clotrimazole  vaginal troches this visit Today she reports symptoms of pyelonephritis have resolved.  She does have chronic low back pain with right-sided sciatica. No current dysuria, flank pain, or left-sided back pain   HTN Blood pressure not at goal today.  She also has longstanding asymptomatic tachycardia.  Currently only prescribed lisinopril  20 mg daily.  Has history of medication nonadherence. BP Readings from Last 3 Encounters:  06/08/24 (!) 136/90  04/22/24 125/64  10/28/23 (!) 147/93    Diabetes mellitus management - Difficulty managing diabetes due to being out of medication for a period - Previously prescribed Humulin  in the hospital, but received Novolin  from the pharmacy - Uncertain about which insulin  is more cost-effective - Has not been taking morning insulin  regularly  Tachycardia - History of elevated pulse rates noted during recent hospital stay - No  sensation of heart racing - Minimal caffeine  intake, primarily drinks water - Occasionally drinks diet sodas and rarely consumes energy drinks - Experiences anxiety at times, possibly related to stress from living with companion who has significant health issues  Back pain and sciatica - Middle back pain associated with sciatica on the right side   Review of Systems  Constitutional:  Negative for fever, malaise/fatigue and weight loss.  HENT: Negative.  Negative for nosebleeds.   Eyes: Negative.  Negative for blurred vision, double vision and photophobia.  Respiratory: Negative.  Negative for cough and shortness of breath.   Cardiovascular: Negative.  Negative for chest pain, palpitations and leg swelling.  Gastrointestinal: Negative.  Negative for heartburn, nausea and vomiting.  Musculoskeletal:  Positive for back pain. Negative for myalgias.  Neurological: Negative.  Negative for dizziness, focal weakness, seizures and headaches.  Psychiatric/Behavioral: Negative.  Negative for suicidal ideas.     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   Outpatient Medications Prior to Visit  Medication Sig Dispense Refill   Blood Glucose Monitoring Suppl (BLOOD GLUCOSE MONITOR SYSTEM) w/Device KIT Use as directed for checking blood sugar 1 kit  0   Glucose Blood (BLOOD GLUCOSE TEST STRIPS) STRP Use to check blood sugar three (3) times daily. 100 strip 0   Lancet Device MISC 1 each by Does not apply route 3 (three) times daily. May dispense any manufacturer covered by patient's insurance. 1 each 0   Lancets MISC Use one (1) lancet three (3) times daily to check blood sugar 100 each 0   TYLENOL  8 HOUR ARTHRITIS PAIN 650 MG CR tablet Take 1,300 mg by mouth every 8 (eight) hours as needed for pain.     atorvastatin  (LIPITOR) 40 MG tablet Take 1 tablet (40 mg total) by mouth daily. 90 tablet 1   gabapentin  (NEURONTIN ) 300 MG capsule Take 1 capsule (300 mg total) by mouth 3 (three) times daily. 90 capsule 3   insulin  isophane & regular human KwikPen (NOVOLIN  70/30 KWIKPEN) (70-30) 100 UNIT/ML KwikPen Inject 16 Units into the skin 2 (two) times daily with a meal. 15 mL 0   Insulin  Pen Needle 31G X 5 MM MISC Use one (1) needle to inject insulin  3 (three) times daily. 100 each 0   lisinopril  (ZESTRIL ) 20 MG tablet Take 1 tablet (20 mg total) by mouth daily. 90 tablet 1   No facility-administered medications prior to visit.    Allergies  Allergen Reactions   Lobster [Shellfish Allergy] Anaphylaxis and Other (See Comments)    Just lobster    Reglan  [Metoclopramide ]     Akathisia is a movement disorder characterized by a strong urge to move, often accompanied by feelings of restlessness, anxiety, and discomfort.       Objective:    BP (!) 136/90 (BP Location: Right Arm, Patient Position: Sitting, Cuff Size: Normal)   Pulse (!) 125   Resp 19   Ht 5' 7 (1.702 m)   Wt 212 lb 3.2 oz (96.3 kg)   SpO2 100%   BMI 33.24 kg/m  Wt Readings from Last 3 Encounters:  06/08/24 212 lb 3.2 oz (96.3 kg)  04/20/24 220 lb (99.8 kg)  10/28/23 223 lb 9.6 oz (101.4 kg)    Physical Exam Vitals and nursing note reviewed.  Constitutional:      Appearance: She is well-developed.  HENT:     Head: Normocephalic and atraumatic.  Cardiovascular:      Rate and Rhythm: Regular rhythm. Tachycardia present.     Heart sounds: Normal heart sounds. No murmur heard.    No friction rub. No gallop.  Pulmonary:     Effort: Pulmonary effort is normal. No tachypnea or respiratory distress.     Breath sounds: Normal breath sounds. No decreased breath sounds, wheezing, rhonchi or rales.  Chest:     Chest wall: No tenderness.  Abdominal:     General: Bowel sounds are normal.     Palpations: Abdomen is soft.  Musculoskeletal:        General: Normal range of motion.     Cervical back: Normal range of motion.  Skin:    General: Skin is warm and dry.  Neurological:     Mental Status: She is alert and oriented to person, place, and time.     Coordination: Coordination normal.  Psychiatric:        Behavior: Behavior normal. Behavior is cooperative.        Thought Content: Thought content normal.        Judgment: Judgment normal.          Patient has been counseled extensively about nutrition and exercise as well as the importance of adherence with medications and regular follow-up. The patient was given clear instructions to go to ER or return to medical center if symptoms don't improve, worsen or new problems develop. The patient verbalized understanding.    Discussed the use of AI scribe software for clinical note transcription with the patient, who gave verbal consent to proceed.    Follow-up: Return in about 2 months (around 08/09/2024).   [name], FNP-BC [facility] [place], [place] [phone]   06/08/2024, 2:09 PM

## 2024-06-09 ENCOUNTER — Other Ambulatory Visit: Payer: Self-pay

## 2024-06-09 ENCOUNTER — Ambulatory Visit: Payer: Self-pay | Admitting: Nurse Practitioner

## 2024-06-09 ENCOUNTER — Telehealth: Payer: Self-pay

## 2024-06-09 DIAGNOSIS — B9689 Other specified bacterial agents as the cause of diseases classified elsewhere: Secondary | ICD-10-CM

## 2024-06-09 DIAGNOSIS — E08621 Diabetes mellitus due to underlying condition with foot ulcer: Secondary | ICD-10-CM

## 2024-06-09 DIAGNOSIS — R82998 Other abnormal findings in urine: Secondary | ICD-10-CM

## 2024-06-09 LAB — CMP14+EGFR
ALT: 118 IU/L — ABNORMAL HIGH (ref 0–32)
AST: 54 IU/L — ABNORMAL HIGH (ref 0–40)
Albumin: 4.3 g/dL (ref 3.9–4.9)
Alkaline Phosphatase: 422 IU/L — ABNORMAL HIGH (ref 44–121)
BUN/Creatinine Ratio: 25 — ABNORMAL HIGH (ref 9–23)
BUN: 22 mg/dL (ref 6–24)
Bilirubin Total: 0.5 mg/dL (ref 0.0–1.2)
CO2: 22 mmol/L (ref 20–29)
Calcium: 10.9 mg/dL — ABNORMAL HIGH (ref 8.7–10.2)
Chloride: 95 mmol/L — ABNORMAL LOW (ref 96–106)
Creatinine, Ser: 0.87 mg/dL (ref 0.57–1.00)
Globulin, Total: 2.8 g/dL (ref 1.5–4.5)
Glucose: 452 mg/dL — ABNORMAL HIGH (ref 70–99)
Potassium: 4.6 mmol/L (ref 3.5–5.2)
Sodium: 135 mmol/L (ref 134–144)
Total Protein: 7.1 g/dL (ref 6.0–8.5)
eGFR: 83 mL/min/1.73 (ref >59)

## 2024-06-09 LAB — URINALYSIS, COMPLETE
Bilirubin, UA: NEGATIVE
Ketones, UA: NEGATIVE
Nitrite, UA: NEGATIVE
Protein,UA: NEGATIVE
RBC, UA: NEGATIVE
Specific Gravity, UA: 1.028 (ref 1.005–1.030)
Urobilinogen, Ur: 0.2 mg/dL (ref 0.2–1.0)
pH, UA: 5.5 (ref 5.0–7.5)

## 2024-06-09 LAB — THYROID PANEL WITH TSH
Free Thyroxine Index: 2.1 (ref 1.2–4.9)
T3 Uptake Ratio: 30 % (ref 24–39)
T4, Total: 6.9 ug/dL (ref 4.5–12.0)
TSH: 2.63 u[IU]/mL (ref 0.450–4.500)

## 2024-06-09 LAB — MICROSCOPIC EXAMINATION
Casts: NONE SEEN /LPF
RBC, Urine: NONE SEEN /HPF (ref 0–2)
WBC, UA: >30 /HPF — AB (ref 0–5)

## 2024-06-09 NOTE — Telephone Encounter (Signed)
 Pharmacy Patient Advocate Encounter  Received notification from [facility] that Prior Authorization for [facility] G7 SENSOR AND RECEIVER has been APPROVED from 06/08/2024 to 12/09/2024

## 2024-06-12 ENCOUNTER — Other Ambulatory Visit: Payer: Self-pay

## 2024-06-12 LAB — URINE CULTURE

## 2024-06-14 ENCOUNTER — Other Ambulatory Visit: Payer: Self-pay

## 2024-06-14 ENCOUNTER — Emergency Department (HOSPITAL_COMMUNITY)
Admission: EM | Admit: 2024-06-14 | Discharge: 2024-06-14 | Disposition: A | Discharge status: Home / Self Care | Attending: Emergency Medicine | Admitting: Emergency Medicine

## 2024-06-14 DIAGNOSIS — Z794 Long term (current) use of insulin: Secondary | ICD-10-CM | POA: Diagnosis not present

## 2024-06-14 DIAGNOSIS — L97519 Non-pressure chronic ulcer of other part of right foot with unspecified severity: Secondary | ICD-10-CM | POA: Insufficient documentation

## 2024-06-14 DIAGNOSIS — E1140 Type 2 diabetes mellitus with diabetic neuropathy, unspecified: Secondary | ICD-10-CM | POA: Insufficient documentation

## 2024-06-14 DIAGNOSIS — E1165 Type 2 diabetes mellitus with hyperglycemia: Secondary | ICD-10-CM | POA: Diagnosis not present

## 2024-06-14 DIAGNOSIS — R109 Unspecified abdominal pain: Secondary | ICD-10-CM | POA: Insufficient documentation

## 2024-06-14 DIAGNOSIS — N390 Urinary tract infection, site not specified: Secondary | ICD-10-CM | POA: Diagnosis not present

## 2024-06-14 DIAGNOSIS — L089 Local infection of the skin and subcutaneous tissue, unspecified: Secondary | ICD-10-CM

## 2024-06-14 DIAGNOSIS — E11621 Type 2 diabetes mellitus with foot ulcer: Secondary | ICD-10-CM | POA: Insufficient documentation

## 2024-06-14 LAB — URINALYSIS, W/ REFLEX TO CULTURE (INFECTION SUSPECTED)
Bilirubin Urine: NEGATIVE
Glucose, UA: >=500 mg/dL — AB
Hgb urine dipstick: NEGATIVE
Ketones, ur: NEGATIVE mg/dL
Nitrite: NEGATIVE
Protein, ur: NEGATIVE mg/dL
Specific Gravity, Urine: 1.022 (ref 1.005–1.030)
pH: 5 (ref 5.0–8.0)

## 2024-06-14 LAB — CBG MONITORING, ED: Glucose-Capillary: 360 mg/dL — ABNORMAL HIGH (ref 70–99)

## 2024-06-14 MED ORDER — CIPROFLOXACIN HCL 500 MG PO TABS: 500.0000 mg | ORAL_TABLET | Freq: Two times a day (BID) | ORAL | 0 refills | Status: DC

## 2024-06-14 MED ORDER — CIPROFLOXACIN HCL 500 MG PO TABS
500.0000 mg | ORAL_TABLET | Freq: Two times a day (BID) | ORAL | 0 refills | Status: DC
Start: 2024-06-14 — End: 2024-06-14
  Filled 2024-06-14: qty 10, 5d supply, fill #0

## 2024-06-14 NOTE — ED Triage Notes (Signed)
 Pt complaining of right foot pain. Pt is a type 2 diabetic and currently has a wound on the bottom of her right foot. Pt has been trying to treat herself, but the pain has gotten worse over time. Pt also complaining of left flank pain. Pt states that the pain has been around for several months now.

## 2024-06-14 NOTE — ED Provider Notes (Signed)
 [place] [facility] DE[facility] AT [facility] Provider Note   CSN: [id number] Arrival date & time: 06/14/24  [id number]     Patient presents with: Foot Ulcer and Flank Pain (Left flank.)   Wendy Olson is a 48 y.o. female.   48 year old female presents with wound to the sole of her right foot.  Has a history of diabetes and has diabetic neuropathy.  Denies any fever or.  States has been there for a few days.  Has not noticed some increased redness going around to the top of her foot.  Also has had some left-sided flank pain for about a month.  Denies any associated vomiting.  Has had changes to her urine.  States has been slightly cloudy that she has had polyuria.  Does have a history of pyelonephritis in the past.       Prior to Admission medications   Medication Sig Start Date End Date Taking? Authorizing Provider  atorvastatin  (LIPITOR) 40 MG tablet Take 1 tablet (40 mg total) by mouth daily. 06/08/24   Fleming, Zelda W, NP  Blood Glucose Monitoring Suppl (BLOOD GLUCOSE MONITOR SYSTEM) w/Device KIT Use as directed for checking blood sugar 04/22/24   Cindy Garnette POUR, MD  Continuous Glucose Receiver (DEXCOM G7 RECEIVER) DEVI Check blood glucose levels continuously.  E11.65  Z79.4 06/08/24   Fleming, Zelda W, NP  Continuous Glucose Receiver (FREESTYLE LIBRE 3 READER) DEVI Check blood glucose levels continuously. 06/08/24   Fleming, Zelda W, NP  Continuous Glucose Sensor (DEXCOM G7 SENSOR) MISC Check blood glucose levels continuously.  Change sensor every 10 days. 06/08/24   Fleming, Zelda W, NP  Continuous Glucose Sensor (FREESTYLE LIBRE 3 SENSOR) MISC Check blood glucose levels continuously.  E11.65  Z79.4 06/08/24   Fleming, Zelda W, NP  gabapentin  (NEURONTIN ) 300 MG capsule Take 1 capsule (300 mg total) by mouth 3 (three) times daily. 06/08/24   Fleming, Zelda W, NP  Glucose Blood (BLOOD GLUCOSE TEST STRIPS) STRP Use to check blood sugar three (3) times daily. 04/22/24   Cindy Garnette POUR, MD  insulin  isophane & regular human KwikPen (NOVOLIN  70/30 KWIKPEN) (70-30) 100 UNIT/ML KwikPen Inject 16 Units into the skin 2 (two) times daily with a meal. 06/08/24   Fleming, Zelda W, NP  Insulin  Pen Needle 31G X 5 MM MISC Use one (1) needle to inject insulin  3 (three) times daily. 06/08/24   Theotis Haze ORN, NP  Lancet Device MISC 1 each by Does not apply route 3 (three) times daily. May dispense any manufacturer covered by patient's insurance. 04/22/24   Cindy Garnette POUR, MD  Lancets MISC Use one (1) lancet three (3) times daily to check blood sugar 04/22/24   Cindy Garnette POUR, MD  lisinopril  (ZESTRIL ) 20 MG tablet Take 1 tablet (20 mg total) by mouth daily. 06/08/24   Fleming, Zelda W, NP  metoprolol  succinate (TOPROL -XL) 25 MG 24 hr tablet Take 1 tablet (25 mg total) by mouth daily. 06/08/24   Fleming, Zelda W, NP  TYLENOL  8 HOUR ARTHRITIS PAIN 650 MG CR tablet Take 1,300 mg by mouth every 8 (eight) hours as needed for pain.    [provider]    Allergies: Char cerise allergy] and Reglan  [metoclopramide ]    Review of Systems  All other systems reviewed and are negative.   Updated Vital Signs BP 139/88 (BP Location: Left Arm)   Pulse 77   Temp 98.4 F (36.9 C) (Oral)   Resp 16   Ht  1.676 m (5' 6)   Wt 96.2 kg   SpO2 98%   BMI 34.22 kg/m   Physical Exam Vitals and nursing note reviewed.  Constitutional:      General: She is not in acute distress.    Appearance: Normal appearance. She is well-developed. She is not toxic-appearing.  HENT:     Head: Normocephalic and atraumatic.  Eyes:     General: Lids are normal.     Conjunctiva/sclera: Conjunctivae normal.     Pupils: Pupils are equal, round, and reactive to light.  Neck:     Thyroid : No thyroid  mass.     Trachea: No tracheal deviation.  Cardiovascular:     Rate and Rhythm: Normal rate and regular rhythm.     Heart sounds: Normal heart sounds. No murmur heard.    No gallop.  Pulmonary:      Effort: Pulmonary effort is normal. No respiratory distress.     Breath sounds: Normal breath sounds. No stridor. No decreased breath sounds, wheezing, rhonchi or rales.  Abdominal:     General: There is no distension.     Palpations: Abdomen is soft.     Tenderness: There is no abdominal tenderness. There is left CVA tenderness. There is no rebound.  Musculoskeletal:        General: No tenderness. Normal range of motion.     Cervical back: Normal range of motion and neck supple.       Feet:  Skin:    General: Skin is warm and dry.     Findings: No abrasion or rash.  Neurological:     Mental Status: She is alert and oriented to person, place, and time. Mental status is at baseline.     GCS: GCS eye subscore is 4. GCS verbal subscore is 5. GCS motor subscore is 6.     Cranial Nerves: No cranial nerve deficit.     Sensory: No sensory deficit.     Motor: Motor function is intact.  Psychiatric:        Attention and Perception: Attention normal.        Speech: Speech normal.        Behavior: Behavior normal.     (all labs ordered are listed, but only abnormal results are displayed) Labs Reviewed  URINALYSIS, W/ REFLEX TO CULTURE (INFECTION SUSPECTED)    EKG: None  Radiology: No results found.   Procedures   Medications Ordered in the ED - No data to display                                  Medical Decision Making Amount and/or Complexity of Data Reviewed Labs: ordered.   Patient blood sugar noted.  States that she has not been following a strict diabetic diet.  Urinalysis does show infection.  Will place patient on antibiotics for that which should also cover her possible early cellulitis of her right foot.  Will follow-up with her doctor next week     Final diagnoses:  None    ED Discharge Orders     None          Dasie Faden, MD 06/14/24 [phone]

## 2024-06-15 ENCOUNTER — Other Ambulatory Visit: Payer: Self-pay

## 2024-06-16 ENCOUNTER — Other Ambulatory Visit: Payer: Self-pay

## 2024-06-16 LAB — URINE CULTURE: Culture: >=100000 — AB

## 2024-06-17 ENCOUNTER — Telehealth (HOSPITAL_BASED_OUTPATIENT_CLINIC_OR_DEPARTMENT_OTHER): Payer: Self-pay | Admitting: *Deleted

## 2024-06-17 ENCOUNTER — Other Ambulatory Visit: Payer: Self-pay

## 2024-06-17 NOTE — Telephone Encounter (Signed)
 Post ED Visit - Positive Culture Follow-up  Culture report reviewed by antimicrobial stewardship pharmacist: [facility] []  Rankin Dee, Pharm.D. []  Venetia Gully, Pharm.D., BCPS AQ-ID []  Garrel Crews, Pharm.D., BCPS [x]  Almarie Lunger, Pharm.D., BCPS []  [place], [place].D., BCPS, AAHIVP []  Rosaline Bihari, Pharm.D., BCPS, AAHIVP []  Vernell Meier, PharmD, BCPS []  Latanya Hint, PharmD, BCPS []  Donald Medley, PharmD, BCPS []  Rocky Bold, PharmD []  Dorothyann Alert, PharmD, BCPS []  Morene Babe, PharmD  [facility] []  Rosaline Edison, PharmD []  Romona Bliss, PharmD []  Dolphus Roller, PharmD []  Veva Seip, Rph []  Vernell Daunt) Leonce, PharmD []  Eva Allis, PharmD []  Rosaline Millet, PharmD []  Iantha Batch, PharmD []  Arvin Gauss, PharmD []  Wanda Hasting, PharmD []  Ronal Rav, PharmD []  Rocky Slade, PharmD []  Bard Jeans, PharmD   Positive urine culture Treated with ciprofloxacin , organism sensitive to the same and no further patient follow-up is required at this time.  Wendy Olson 06/17/2024, 9:50 AM

## 2024-06-22 ENCOUNTER — Other Ambulatory Visit: Payer: Self-pay

## 2024-06-22 ENCOUNTER — Telehealth: Payer: Self-pay | Admitting: Nurse Practitioner

## 2024-06-22 NOTE — Telephone Encounter (Signed)
 Copied from CRM [id number]. Topic: Referral - Request for Referral >> Jun 22, 2024 11:54 AM Wess RAMAN wrote:  Did the patient discuss referral with their provider in the last year? Yes (If No - schedule appointment) (If Yes - send message)  Appointment offered? No  Type of order/referral and detailed reason for visit: Dentist  Preference of office, provider, location: [facility]  If referral order, have you been seen by this specialty before? No (If Yes, this issue or another issue? When? Where?  Can we respond through MyChart? Yes

## 2024-06-23 NOTE — Telephone Encounter (Signed)
 Message sent through my chart

## 2024-06-24 ENCOUNTER — Other Ambulatory Visit (HOSPITAL_COMMUNITY)
Admission: RE | Admit: 2024-06-24 | Discharge: 2024-06-24 | Disposition: A | Discharge status: Home / Self Care | Source: Ambulatory Visit | Attending: Nurse Practitioner | Admitting: Nurse Practitioner

## 2024-06-24 ENCOUNTER — Ambulatory Visit: Attending: Nurse Practitioner

## 2024-06-24 DIAGNOSIS — N390 Urinary tract infection, site not specified: Secondary | ICD-10-CM | POA: Insufficient documentation

## 2024-06-24 DIAGNOSIS — R82998 Other abnormal findings in urine: Secondary | ICD-10-CM

## 2024-06-24 DIAGNOSIS — B9689 Other specified bacterial agents as the cause of diseases classified elsewhere: Secondary | ICD-10-CM | POA: Diagnosis present

## 2024-06-24 NOTE — Progress Notes (Signed)
 Patient in the office for urine collection and vaginal swab.

## 2024-06-25 LAB — CERVICOVAGINAL ANCILLARY ONLY
Bacterial Vaginitis (gardnerella): POSITIVE — AB
Candida Glabrata: POSITIVE — AB
Candida Vaginitis: NEGATIVE
Chlamydia: NEGATIVE
Comment: NEGATIVE
Comment: NEGATIVE
Comment: NEGATIVE
Comment: NEGATIVE
Comment: NEGATIVE
Comment: NORMAL
Neisseria Gonorrhea: NEGATIVE
Trichomonas: NEGATIVE

## 2024-06-25 LAB — URINALYSIS, COMPLETE
Bilirubin, UA: NEGATIVE
Ketones, UA: NEGATIVE
Leukocytes,UA: NEGATIVE
Nitrite, UA: NEGATIVE
Protein,UA: NEGATIVE
RBC, UA: NEGATIVE
Specific Gravity, UA: 1.028 (ref 1.005–1.030)
Urobilinogen, Ur: 0.2 mg/dL (ref 0.2–1.0)
pH, UA: 6 (ref 5.0–7.5)

## 2024-06-25 LAB — MICROSCOPIC EXAMINATION
Casts: NONE SEEN /LPF
Epithelial Cells (non renal): >10 /HPF — AB (ref 0–10)
RBC, Urine: NONE SEEN /HPF (ref 0–2)

## 2024-06-26 LAB — URINE CULTURE

## 2024-06-26 NOTE — Telephone Encounter (Signed)
 Copied from CRM [id number]. Topic: Clinical - Lab/Test Results >> Jun 26, 2024  8:24 AM [place] E wrote:  Reason for CRM: Patient would like to go over her lab results and next steps. Callback number [phone].

## 2024-06-29 ENCOUNTER — Ambulatory Visit (INDEPENDENT_AMBULATORY_CARE_PROVIDER_SITE_OTHER)

## 2024-06-29 ENCOUNTER — Ambulatory Visit (INDEPENDENT_AMBULATORY_CARE_PROVIDER_SITE_OTHER): Admitting: Podiatry

## 2024-06-29 ENCOUNTER — Encounter: Payer: Self-pay | Admitting: Podiatry

## 2024-06-29 VITALS — Ht 66.0 in | Wt 212.0 lb

## 2024-06-29 DIAGNOSIS — E08621 Diabetes mellitus due to underlying condition with foot ulcer: Secondary | ICD-10-CM

## 2024-06-29 DIAGNOSIS — L97411 Non-pressure chronic ulcer of right heel and midfoot limited to breakdown of skin: Secondary | ICD-10-CM

## 2024-06-29 DIAGNOSIS — L97512 Non-pressure chronic ulcer of other part of right foot with fat layer exposed: Secondary | ICD-10-CM | POA: Diagnosis not present

## 2024-06-29 NOTE — Progress Notes (Signed)
   Chief Complaint  Patient presents with   Wound Check    Pt is here due to ulcer on the right foot, she states that it is healing but PCP wanted her to f/u here, has been there for about a month, states no drainage at this time but is very painful overall, hurts to put pressure on, she is currently wearing post op shoe.    HPI: 48 y.o. female PMHx diabetes mellitus; uncontrolled, last A1c 04/21/2024 was 11.6, presenting today for evaluation of an ulcer to the plantar aspect of the fifth MTP of the right foot.  She is unsure when the wound began.  No history of injury.  She went to the emergency department where she was evaluated and placed on oral antibiotics.  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     Physical Exam: General: The patient is alert and oriented x3 in no acute distress.  Dermatology: Skin is warm, dry and supple bilateral lower extremities.  The ulcer to the plantar aspect of the fifth MTP of the right foot has healed and resolved completely.  Complete reepithelialization is occurred.  No open wounds noted  Vascular: Palpable pedal pulses bilaterally. Capillary refill within normal limits.  No appreciable edema.  No erythema.  Neurological: Light touch and protective threshold diminished bilateral  Musculoskeletal Exam: No pedal deformities noted  Radiographic  Exam RT foot 06/29/2024:  Normal osseous mineralization. Joint spaces preserved.  No fractures or osseous irregularities noted.  Assessment/Plan of Care: 1.  Diabetic foot ulcer right; healed  -Patient evaluated.  X-rays reviewed -Stressed the importance of working closely with PCP to manage and better control her diabetes -Refrain from going barefoot.  Recommended supportive shoes and slippers even around the house -Discontinue postop shoe.  Recommend good supportive tennis shoes and sneakers -Return to clinic annually for routine annual diabetic foot exam       Thresa EMERSON Sar, DPM [facility]  Dr. Thresa EMERSON Sar, DPM    [address] [place], [place] [address]                Office [phone]  Fax [phone]

## 2024-06-30 ENCOUNTER — Other Ambulatory Visit: Payer: Self-pay

## 2024-06-30 ENCOUNTER — Ambulatory Visit: Payer: Self-pay | Admitting: Nurse Practitioner

## 2024-06-30 ENCOUNTER — Other Ambulatory Visit: Payer: Self-pay | Admitting: Nurse Practitioner

## 2024-06-30 DIAGNOSIS — B9689 Other specified bacterial agents as the cause of diseases classified elsewhere: Secondary | ICD-10-CM

## 2024-06-30 DIAGNOSIS — B3731 Acute candidiasis of vulva and vagina: Secondary | ICD-10-CM

## 2024-06-30 MED ORDER — METRONIDAZOLE 500 MG PO TABS
500.0000 mg | ORAL_TABLET | Freq: Two times a day (BID) | ORAL | 0 refills | Status: AC
  Filled 2024-06-30: qty 14, 7d supply, fill #0

## 2024-06-30 MED ORDER — FLUCONAZOLE 150 MG PO TABS
150.0000 mg | ORAL_TABLET | Freq: Once | ORAL | 0 refills | Status: AC
  Filled 2024-06-30: qty 1, 1d supply, fill #0

## 2024-07-01 ENCOUNTER — Other Ambulatory Visit: Payer: Self-pay

## 2024-07-03 ENCOUNTER — Encounter: Payer: Self-pay | Admitting: Nurse Practitioner

## 2024-07-03 NOTE — Telephone Encounter (Signed)
 Yes VV. Thank you

## 2024-07-03 NOTE — Telephone Encounter (Signed)
Patient identified by name and date of birth.  Appointment scheduled.  

## 2024-07-06 ENCOUNTER — Telehealth: Admitting: Nurse Practitioner

## 2024-07-06 ENCOUNTER — Other Ambulatory Visit: Payer: Self-pay

## 2024-07-06 ENCOUNTER — Encounter: Payer: Self-pay | Admitting: Nurse Practitioner

## 2024-07-06 ENCOUNTER — Other Ambulatory Visit (HOSPITAL_COMMUNITY): Payer: Self-pay

## 2024-07-06 DIAGNOSIS — F411 Generalized anxiety disorder: Secondary | ICD-10-CM

## 2024-07-06 MED ORDER — BUSPIRONE HCL 10 MG PO TABS
10.0000 mg | ORAL_TABLET | Freq: Two times a day (BID) | ORAL | 0 refills | Status: DC
  Filled 2024-07-06 – 2024-07-16 (×2): qty 60, 30d supply, fill #0
  Filled 2024-08-08: qty 60, 30d supply, fill #1

## 2024-07-06 NOTE — Progress Notes (Signed)
 Virtual Visit Consent   Wendy Olson, you are scheduled for a virtual visit with a  provider today. Just as with appointments in the office, your consent must be obtained to participate. Your consent will be active for this visit and any virtual visit you may have with one of our providers in the next 365 days. If you have a MyChart account, a copy of this consent can be sent to you electronically.  As this is a virtual visit, video technology does not allow for your provider to perform a traditional examination. This may limit your provider's ability to fully assess your condition. If your provider identifies any concerns that need to be evaluated in person or the need to arrange testing (such as labs, EKG, etc.), we will make arrangements to do so. Although advances in technology are sophisticated, we cannot ensure that it will always work on either your end or our end. If the connection with a video visit is poor, the visit may have to be switched to a telephone visit. With either a video or telephone visit, we are not always able to ensure that we have a secure connection.  By engaging in this virtual visit, you consent to the provision of healthcare and authorize for your insurance to be billed (if applicable) for the services provided during this visit. Depending on your insurance coverage, you may receive a charge related to this service.  I need to obtain your verbal consent now. Are you willing to proceed with your visit today? Wendy Olson has provided verbal consent on 07/06/2024 for a virtual visit (video or telephone). [name], NP  Date: 07/06/2024 4:57 PM   Virtual Visit via Video Note   I, [name], connected with  Wendy Olson  ([id number], 48-Sep-1977) on 07/06/24 at  4:10 PM EDT by a video-enabled telemedicine application and verified that I am speaking with the correct person using two identifiers.  Location: Patient: Virtual Visit Location  Patient: Home Provider: Virtual Visit Location Provider: Home Office   I discussed the limitations of evaluation and management by telemedicine and the availability of in person appointments. The patient expressed understanding and agreed to proceed.    History of Present Illness: Wendy Olson is a 48 y.o. who identifies as a female who was assigned female at birth, and is being seen today for anxiety and depression.  She was previously prescribed buspar  10 mg in the past and would like to restart today. She is not experiencing any thoughts of self harm.     07/06/2024    4:56 PM 06/08/2024   10:54 AM 10/28/2023    3:04 PM  Depression screen PHQ 2/9  Decreased Interest 3 0 0  Down, Depressed, Hopeless 1 0 0  PHQ - 2 Score 4 0 0  Altered sleeping 3 1 0  Tired, decreased energy 1 0 0  Change in appetite 2 0 0  Feeling bad or failure about yourself  0 0 0  Trouble concentrating 1 0 0  Moving slowly or fidgety/restless 0 0 0  Suicidal thoughts 0 0 0  PHQ-9 Score 11 1 0  Difficult doing work/chores Somewhat difficult Not difficult at all        07/06/2024    4:55 PM 06/08/2024   10:55 AM 10/28/2023    3:04 PM 01/08/2022    9:39 AM  GAD 7 : Generalized Anxiety Score  Nervous, Anxious, on Edge 1 0 0 0  Control/stop  worrying 3 0 0 0  Worry too much - different things 3 0 0 0  Trouble relaxing 3 0 0 0  Restless 1 0 0 0  Easily annoyed or irritable 0 0 0 0  Afraid - awful might happen 0 0 0 0  Total GAD 7 Score 11 0 0 0  Anxiety Difficulty Somewhat difficult Not difficult at all  Not difficult at all        Problems:  Patient Active Problem List   Diagnosis Date Noted   Candidiasis of female genitalia 04/21/2024   Hypophosphatemia 04/21/2024   Acute pyelonephritis 04/20/2024   Essential hypertension 04/20/2024   Hyperlipidemia 04/20/2024   GERD (gastroesophageal reflux disease) 04/20/2024   Hematuria 04/20/2024   Dehydration 04/20/2024   Hyponatremia 04/20/2024    Acidosis, metabolic 04/20/2024   Constipation 04/20/2024   Type 2 diabetes mellitus with diabetic polyneuropathy, with long-term current use of insulin  (HCC) 10/28/2023   Sore throat 12/30/2016   Hypertension 12/14/2015   Type 2 diabetes mellitus with hyperglycemia, without long-term current use of insulin  (HCC) 11/01/2015    Allergies:  Allergies  Allergen Reactions   Lobster [Shellfish Allergy] Anaphylaxis and Other (See Comments)    Just lobster    Reglan  [Metoclopramide ]     Akathisia is a movement disorder characterized by a strong urge to move, often accompanied by feelings of restlessness, anxiety, and discomfort.   Medications:  Current Outpatient Medications:    busPIRone  (BUSPAR ) 10 MG tablet, Take 1 tablet (10 mg total) by mouth 2 (two) times daily. FOR ANXIETY, Disp: 180 tablet, Rfl: 0   atorvastatin  (LIPITOR) 40 MG tablet, Take 1 tablet (40 mg total) by mouth daily., Disp: 90 tablet, Rfl: 1   Blood Glucose Monitoring Suppl (BLOOD GLUCOSE MONITOR SYSTEM) w/Device KIT, Use as directed for checking blood sugar, Disp: 1 kit, Rfl: 0   ciprofloxacin  (CIPRO ) 500 MG tablet, Take 1 tablet (500 mg total) by mouth every 12 (twelve) hours., Disp: 10 tablet, Rfl: 0   Continuous Glucose Receiver (DEXCOM G7 RECEIVER) DEVI, Check blood glucose levels continuously.  E11.65  Z79.4, Disp: 1 each, Rfl: 0   Continuous Glucose Receiver (FREESTYLE LIBRE 3 READER) DEVI, Check blood glucose levels continuously., Disp: 1 each, Rfl: 0   Continuous Glucose Sensor (DEXCOM G7 SENSOR) MISC, Check blood glucose levels continuously.  Change sensor every 10 days., Disp: 3 each, Rfl: 6   Continuous Glucose Sensor (FREESTYLE LIBRE 3 SENSOR) MISC, Check blood glucose levels continuously.  E11.65  Z79.4, Disp: 2 each, Rfl: 6   fluconazole  (DIFLUCAN ) 150 MG tablet, Take 1 tablet (150 mg total) by mouth once for 1 dose., Disp: 1 tablet, Rfl: 0   gabapentin  (NEURONTIN ) 300 MG capsule, Take 1 capsule (300 mg total) by  mouth 3 (three) times daily., Disp: 90 capsule, Rfl: 3   Glucose Blood (BLOOD GLUCOSE TEST STRIPS) STRP, Use to check blood sugar three (3) times daily., Disp: 100 strip, Rfl: 0   insulin  isophane & regular human KwikPen (NOVOLIN  70/30 KWIKPEN) (70-30) 100 UNIT/ML KwikPen, Inject 16 Units into the skin 2 (two) times daily with a meal., Disp: 15 mL, Rfl: 6   Insulin  Pen Needle 31G X 5 MM MISC, Use one (1) needle to inject insulin  3 (three) times daily., Disp: 100 each, Rfl: 0   Lancet Device MISC, 1 each by Does not apply route 3 (three) times daily. May dispense any manufacturer covered by patient's insurance., Disp: 1 each, Rfl: 0   Lancets MISC, Use one (  1) lancet three (3) times daily to check blood sugar, Disp: 100 each, Rfl: 0   lisinopril  (ZESTRIL ) 20 MG tablet, Take 1 tablet (20 mg total) by mouth daily., Disp: 90 tablet, Rfl: 1   metoprolol  succinate (TOPROL -XL) 25 MG 24 hr tablet, Take 1 tablet (25 mg total) by mouth daily., Disp: 90 tablet, Rfl: 3   metroNIDAZOLE  (FLAGYL ) 500 MG tablet, Take 1 tablet (500 mg total) by mouth 2 (two) times daily for 7 days., Disp: 14 tablet, Rfl: 0   TYLENOL  8 HOUR ARTHRITIS PAIN 650 MG CR tablet, Take 1,300 mg by mouth every 8 (eight) hours as needed for pain., Disp: , Rfl:   Observations/Objective: Patient is well-developed, well-nourished in no acute distress.  Resting comfortably at home.  Head is normocephalic, atraumatic.  No labored breathing.  Speech is clear and coherent with logical content.  Patient is alert and oriented at baseline.    Assessment and Plan: 1. GAD (generalized anxiety disorder) (Primary) - busPIRone  (BUSPAR ) 10 MG tablet; Take 1 tablet (10 mg total) by mouth 2 (two) times daily. FOR ANXIETY  Dispense: 180 tablet; Refill: 0   Follow Up Instructions: I discussed the assessment and treatment plan with the patient. The patient was provided an opportunity to ask questions and all were answered. The patient agreed with the plan  and demonstrated an understanding of the instructions.  A copy of instructions were sent to the patient via MyChart unless otherwise noted below.    The patient was advised to call back or seek an in-person evaluation if the symptoms worsen or if the condition fails to improve as anticipated.    Marea Reasner W Edwyn Inclan, NP

## 2024-07-08 ENCOUNTER — Telehealth: Payer: Self-pay

## 2024-07-08 NOTE — Telephone Encounter (Signed)
 Noted

## 2024-07-08 NOTE — Telephone Encounter (Signed)
 Received

## 2024-07-08 NOTE — Telephone Encounter (Signed)
 Copied from CRM [id number]. Topic: General - Other >> Jul 08, 2024  2:14 PM Selinda RAMAN wrote: Reason for CRM: Rosina with [facility] called in checking on the status of a faxed prescription that was originally sent in on July 28th around 8. After looking and speaking with Rashiema neither one of us  was able to locate it. Rashiema told me to tell Rosina to re fax it as she will be on the lookout for it. It is in regards to an Omnipod. Please assist further

## 2024-07-10 ENCOUNTER — Other Ambulatory Visit: Payer: Self-pay

## 2024-07-15 ENCOUNTER — Other Ambulatory Visit: Payer: Self-pay

## 2024-07-16 ENCOUNTER — Other Ambulatory Visit: Payer: Self-pay

## 2024-07-17 ENCOUNTER — Other Ambulatory Visit: Payer: Self-pay

## 2024-07-21 ENCOUNTER — Other Ambulatory Visit: Payer: Self-pay

## 2024-07-23 ENCOUNTER — Other Ambulatory Visit: Payer: Self-pay

## 2024-07-24 ENCOUNTER — Other Ambulatory Visit: Payer: Self-pay | Admitting: Nurse Practitioner

## 2024-07-24 ENCOUNTER — Other Ambulatory Visit: Payer: Self-pay

## 2024-07-24 DIAGNOSIS — E119 Type 2 diabetes mellitus without complications: Secondary | ICD-10-CM

## 2024-07-24 MED ORDER — DEXCOM G7 RECEIVER DEVI
0 refills | Status: AC
  Filled 2024-07-24: qty 1, fill #0
  Filled 2024-08-08: qty 1, 90d supply, fill #0
  Filled 2024-09-01: qty 1, 10d supply, fill #0
  Filled 2024-09-04 – 2024-09-13 (×3): qty 1, fill #0
  Filled 2024-12-13: qty 1, 90d supply, fill #0
  Filled 2024-12-24: qty 1, 15d supply, fill #0

## 2024-07-26 ENCOUNTER — Other Ambulatory Visit: Payer: Self-pay | Admitting: Nurse Practitioner

## 2024-07-26 DIAGNOSIS — E119 Type 2 diabetes mellitus without complications: Secondary | ICD-10-CM

## 2024-07-27 MED ORDER — TECHLITE PEN NEEDLES 31G X 5 MM MISC
2 refills | Status: DC
  Filled 2024-07-27: qty 100, 50d supply, fill #0
  Filled 2024-08-08 – 2024-09-04 (×2): qty 100, 50d supply, fill #1
  Filled 2024-10-07 – 2024-10-11 (×2): qty 100, 50d supply, fill #2

## 2024-07-28 ENCOUNTER — Other Ambulatory Visit: Payer: Self-pay

## 2024-07-29 ENCOUNTER — Other Ambulatory Visit: Payer: Self-pay

## 2024-07-31 ENCOUNTER — Other Ambulatory Visit: Payer: Self-pay | Admitting: Nurse Practitioner

## 2024-07-31 DIAGNOSIS — Z1231 Encounter for screening mammogram for malignant neoplasm of breast: Secondary | ICD-10-CM

## 2024-08-03 ENCOUNTER — Other Ambulatory Visit: Payer: Self-pay

## 2024-08-08 ENCOUNTER — Encounter: Payer: Self-pay | Admitting: Nurse Practitioner

## 2024-08-08 ENCOUNTER — Other Ambulatory Visit: Payer: Self-pay

## 2024-08-09 ENCOUNTER — Other Ambulatory Visit: Payer: Self-pay

## 2024-08-10 ENCOUNTER — Encounter: Payer: Self-pay | Admitting: Nurse Practitioner

## 2024-08-10 ENCOUNTER — Other Ambulatory Visit: Payer: Self-pay

## 2024-08-10 ENCOUNTER — Ambulatory Visit

## 2024-08-10 ENCOUNTER — Ambulatory Visit: Attending: Nurse Practitioner | Admitting: Nurse Practitioner

## 2024-08-10 VITALS — BP 115/77 | HR 84 | Resp 18 | Ht 66.0 in | Wt 218.0 lb

## 2024-08-10 DIAGNOSIS — Z794 Long term (current) use of insulin: Secondary | ICD-10-CM | POA: Diagnosis not present

## 2024-08-10 DIAGNOSIS — F411 Generalized anxiety disorder: Secondary | ICD-10-CM | POA: Diagnosis not present

## 2024-08-10 DIAGNOSIS — M545 Low back pain, unspecified: Secondary | ICD-10-CM

## 2024-08-10 DIAGNOSIS — G8929 Other chronic pain: Secondary | ICD-10-CM

## 2024-08-10 DIAGNOSIS — R195 Other fecal abnormalities: Secondary | ICD-10-CM | POA: Diagnosis not present

## 2024-08-10 DIAGNOSIS — E1142 Type 2 diabetes mellitus with diabetic polyneuropathy: Secondary | ICD-10-CM

## 2024-08-10 DIAGNOSIS — Z1231 Encounter for screening mammogram for malignant neoplasm of breast: Secondary | ICD-10-CM

## 2024-08-10 DIAGNOSIS — R748 Abnormal levels of other serum enzymes: Secondary | ICD-10-CM

## 2024-08-10 LAB — POCT GLYCOSYLATED HEMOGLOBIN (HGB A1C): Hemoglobin A1C: 11.4 % — AB (ref 4.0–5.6)

## 2024-08-10 MED ORDER — TRULICITY 0.75 MG/0.5ML ~~LOC~~ SOAJ
0.7500 mg | SUBCUTANEOUS | 1 refills | Status: DC
  Filled 2024-08-10: qty 2, 28d supply, fill #0
  Filled 2024-09-01: qty 2, 28d supply, fill #1

## 2024-08-10 MED ORDER — GLIPIZIDE 5 MG PO TABS
5.0000 mg | ORAL_TABLET | Freq: Two times a day (BID) | ORAL | 3 refills | Status: AC
  Filled 2024-08-10: qty 180, 90d supply, fill #0
  Filled 2024-09-04 – 2024-09-18 (×11): qty 180, 90d supply, fill #1
  Filled 2024-09-18 – 2024-09-21 (×2): qty 60, 30d supply, fill #1
  Filled 2024-10-01: qty 180, 90d supply, fill #1
  Filled 2024-10-06: qty 60, 30d supply, fill #1
  Filled 2024-10-25: qty 60, 30d supply, fill #2
  Filled 2024-11-24: qty 60, 30d supply, fill #3
  Filled 2024-12-10 – 2024-12-11 (×3): qty 60, 30d supply, fill #4
  Filled 2024-12-28: qty 60, 30d supply, fill #5

## 2024-08-10 MED ORDER — BUSPIRONE HCL 10 MG PO TABS
10.0000 mg | ORAL_TABLET | Freq: Two times a day (BID) | ORAL | 1 refills | Status: AC
  Filled 2024-08-10: qty 60, 30d supply, fill #0
  Filled 2024-09-18 – 2024-10-07 (×2): qty 60, 30d supply, fill #1
  Filled 2024-11-06 – 2024-11-20 (×2): qty 60, 30d supply, fill #2
  Filled 2024-12-19: qty 60, 30d supply, fill #3

## 2024-08-10 NOTE — Telephone Encounter (Signed)
 Noted

## 2024-08-10 NOTE — Progress Notes (Signed)
 Assessment & Plan:  [name] was seen today for hypertension and diabetes.  Diagnoses and all orders for this visit:  Type 2 diabetes mellitus with diabetic polyneuropathy, with long-term current use of insulin  (HCC) -     POCT glycosylated hemoglobin (Hb A1C) -     glipiZIDE  (GLUCOTROL ) 5 MG tablet; Take 1 tablet (5 mg total) by mouth 2 (two) times daily before a meal. -     Dulaglutide  (TRULICITY ) 0.75 MG/0.5ML SOAJ; Inject 0.75 mg into the skin once a week. Type 2 diabetes mellitus with diabetic polyneuropathy Elevated blood glucose with A1c of 11.2%. On insulin  therapy. Initiated continuous glucose monitoring. Goal to reduce glucose below 200 mg/dL. - Start Trulicity . - Restart glipizide  twice daily - Schedule virtual follow-up in 3-4 weeks to review glucose readings and adjust treatment.   GAD (generalized anxiety disorder) -     busPIRone  (BUSPAR ) 10 MG tablet; Take 1 tablet (10 mg total) by mouth 2 (two) times daily. FOR ANXIETY  Loose stools -     Giardia, EIA; Ova/Parasite Loose stools and diarrhea since this morning. No dietary causes identified. Possible parasitic infection. - Refer to gastroenterology. - Provide stool parasite test and instruct to return sample for analysis.   Chronic right-sided low back pain without sciatica -     Ambulatory referral to Orthopedic Surgery Chronic back pain affecting work. MRI showed no significant findings. No current orthopedic evaluation. - Refer to orthopedics for evaluation and potential disability assessment.  Elevated liver enzymes -     CMP14+EGFR -     Acute Hep Panel & Hep B Surface Ab -     Lipase -     Amylase -     Gamma GT -     Ambulatory referral to Gastroenterology -     VITAMIN D  25 Hydroxy (Vit-D Deficiency, Fractures) -     US  Abdomen Limited RUQ (LIVER/GB); Future      Patient has been counseled on age-appropriate routine health concerns for screening and prevention. These are reviewed and up-to-date.  Referrals have been placed accordingly. Immunizations are up-to-date or declined.    Subjective:   Chief Complaint  Patient presents with   Hypertension   Diabetes   History of Present Illness Wendy Olson is a 48 year old female with diabetes who presents for DM with elevated blood glucose levels and gastrointestinal symptoms of loose stools.  She is accompanied by her significant other today.   She has a past medical history of Anxiety, Asthma, DM2, Diabetic neuropathy, diabetic retinopathy, diabetic foot ulcer, Hypertension, nuclear sclerotic cataracts, HPL, and Seasonal allergies   DM2 She has elevated blood glucose levels with a ninety-day average of 342 mg/dL and a fourteen-day average of 325 mg/d based on review of her dexcom app. She has been using glucose sensors consistently for about five days. Her current insulin  regimen includes 16 units twice daily of 70/30 insulin , administered around 9 AM and 6 PM, typically before meals. She is concerned about her A1c, which is 11. today Lab Results  Component Value Date   HGBA1C 11.6 (H) 04/21/2024     She experiences chronic gastrointestinal symptoms, including an upset stomach and diarrhea, described as 'explosive' and 'like water.' These symptoms occur frequently, with the last episode occurring around 9 AM today. No lactose intolerance or significant dairy intake is reported. She did not discuss these symptoms during her colonoscopy preop visit in December.    She has a history of back pain, attributed  to sciatica, which has prevented her from working since last year. She previously worked as a Conservation officer, nature, but the back pain made it difficult to stand for prolonged periods. An MRI conducted in October of the previous year did not reveal significant findings. She is not currently on disability but is considering it due to her back pain.  Blood pressure at goal with toprol  XL 25mg  daily and lisinopril  20 mg daily BP Readings from Last  3 Encounters:  08/10/24 115/77  06/14/24 139/88  06/08/24 (!) 136/90     Lab Results  Component Value Date   HGBA1C 11.6 (H) 04/21/2024    History of Present Illness Wendy Olson is a 48 year old female with diabetes who presents with elevated blood glucose levels and gastrointestinal symptoms.  She has elevated blood glucose levels with a ninety-day average of 342 mg/dL and a fourteen-day average of 325 mg/dL. She has been using glucose sensors consistently for about five days. Her current insulin  regimen includes 16 units twice daily, administered around 9 AM and 6 PM, typically before meals. She is concerned about her A1c, which is estimated to be between 11 and 12.  She experiences gastrointestinal symptoms, including an upset stomach and diarrhea, described as 'explosive' and 'like water.' These symptoms occur frequently, with the last episode occurring around 9 AM today. No lactose intolerance or significant dairy intake is reported. She did not discuss these symptoms during her colonoscopy in December. Her family member mentions dietary adjustments, including increased vegetable intake, such as broccoli.  She has a history of back pain, attributed to sciatica, which has prevented her from working since last year. She previously worked as a Conservation officer, nature, but the back pain made it difficult to stand for prolonged periods. An MRI conducted in October of the previous year did not reveal significant findings. She is not currently on disability but is considering it due to her back pain.   Review of Systems  Constitutional:  Negative for fever, malaise/fatigue and weight loss.  HENT: Negative.  Negative for nosebleeds.   Eyes: Negative.  Negative for blurred vision, double vision and photophobia.  Respiratory: Negative.  Negative for cough and shortness of breath.   Cardiovascular: Negative.  Negative for chest pain, palpitations and leg swelling.  Gastrointestinal:  Positive for diarrhea.  Negative for abdominal pain, blood in stool, constipation, heartburn, melena, nausea and vomiting.  Musculoskeletal: Negative.  Negative for myalgias.  Neurological: Negative.  Negative for dizziness, focal weakness, seizures and headaches.  Psychiatric/Behavioral: Negative.  Negative for suicidal ideas.     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   Outpatient Medications Prior to Visit  Medication Sig Dispense Refill   atorvastatin  (LIPITOR) 40 MG tablet Take 1 tablet (40 mg total) by mouth daily. 90 tablet 1   Blood Glucose Monitoring Suppl (BLOOD GLUCOSE MONITOR SYSTEM) w/Device KIT Use as directed for checking blood sugar 1 kit 0   Continuous Glucose Receiver (DEXCOM G7 RECEIVER) DEVI Check blood glucose levels continuously.  E11.65  Z79.4 1 each 0   Continuous Glucose Sensor (DEXCOM G7 SENSOR) MISC Check blood glucose levels continuously.  Change sensor every 10 days. 3 each 6   Glucose Blood (BLOOD GLUCOSE TEST STRIPS) STRP Use to check blood sugar three (3) times daily. 100 strip 0   insulin  isophane & regular human KwikPen (NOVOLIN  70/30 KWIKPEN) (70-30) 100 UNIT/ML KwikPen Inject 16 Units into the skin 2 (two) times daily with a meal. 15 mL 6   Insulin  Pen Needle (TECHLITE PEN NEEDLES) 31G X 5 MM MISC Use to inject insulin  twice  daily 100 each 2   Lancet Device MISC 1 each by Does not apply route 3 (three) times daily. May dispense any manufacturer covered by patient's insurance. 1 each 0   Lancets MISC Use one (1) lancet three (3) times daily to check blood sugar 100 each 0   lisinopril  (ZESTRIL ) 20 MG tablet Take 1 tablet (20 mg total) by mouth daily. 90 tablet 1   metoprolol  succinate (TOPROL -XL) 25 MG 24 hr tablet Take 1 tablet (25 mg total) by mouth daily. 90 tablet 3   TYLENOL  8 HOUR ARTHRITIS PAIN 650 MG CR tablet Take 1,300 mg by mouth every 8 (eight) hours as needed for pain.     busPIRone  (BUSPAR ) 10 MG tablet Take 1 tablet (10 mg total) by mouth 2 (two) times daily. FOR ANXIETY 180 tablet 0   gabapentin  (NEURONTIN ) 300 MG capsule Take 1 capsule (300 mg total) by mouth 3 (three) times daily. (Patient not taking: Reported on 08/10/2024) 90 capsule 3   ciprofloxacin  (CIPRO ) 500 MG tablet Take 1 tablet (500 mg total) by mouth every 12 (twelve) hours. 10 tablet 0   Continuous Glucose Receiver (FREESTYLE LIBRE 3 READER) DEVI Check blood glucose levels continuously. 1 each 0   Continuous Glucose Sensor (FREESTYLE LIBRE 3 PLUS SENSOR) MISC Check blood glucose levels continuously.  CHANGE SENSORS EVERY 15 DAYS 2 each 6   No facility-administered medications prior to visit.    Allergies  Allergen Reactions   Lobster [Shellfish Allergy] Anaphylaxis and Other (See Comments)    Just lobster    Reglan  [Metoclopramide ]     Akathisia is a movement disorder characterized by a strong urge to move, often accompanied by feelings of restlessness, anxiety, and discomfort.       Objective:    BP 115/77 (BP Location: Left Arm, Patient Position: Sitting, Cuff Size: Normal)   Pulse 84   Resp 18   Ht 5' 6 (1.676 m)   Wt 218 lb (98.9 kg)   SpO2 99%   BMI 35.19 kg/m  Wt Readings from Last 3 Encounters:  08/10/24 218 lb (98.9 kg)  06/29/24 212 lb (96.2 kg)  06/14/24 212 lb (96.2 kg)    Physical Exam Vitals and nursing  note reviewed.  Constitutional:      Appearance: She is well-developed.  HENT:     Head: Normocephalic and atraumatic.  Cardiovascular:     Rate and Rhythm: Normal rate and regular rhythm.     Heart sounds: Normal heart  sounds. No murmur heard.    No friction rub. No gallop.  Pulmonary:     Effort: Pulmonary effort is normal. No tachypnea or respiratory distress.     Breath sounds: Normal breath sounds. No decreased breath sounds, wheezing, rhonchi or rales.  Chest:     Chest wall: No tenderness.  Abdominal:     General: Bowel sounds are normal.     Palpations: Abdomen is soft.  Musculoskeletal:        General: Normal range of motion.     Cervical back: Normal range of motion.  Skin:    General: Skin is warm and dry.  Neurological:     Mental Status: She is alert and oriented to person, place, and time.     Coordination: Coordination normal.  Psychiatric:        Behavior: Behavior normal. Behavior is cooperative.        Thought Content: Thought content normal.        Judgment: Judgment normal.          Patient has been counseled extensively about nutrition and exercise as well as the importance of adherence with medications and regular follow-up. The patient was given clear instructions to go to ER or return to medical center if symptoms don't improve, worsen or new problems develop. The patient verbalized understanding.   Follow-up: Return in about 4 weeks (around 09/07/2024) for VV at 1110 or 130.   [name], FNP-BC [facility] [place], [place] [phone]   08/10/2024, 5:16 PM

## 2024-08-11 ENCOUNTER — Other Ambulatory Visit: Payer: Self-pay | Admitting: Nurse Practitioner

## 2024-08-11 ENCOUNTER — Other Ambulatory Visit: Payer: Self-pay

## 2024-08-11 ENCOUNTER — Ambulatory Visit: Payer: Self-pay | Admitting: Nurse Practitioner

## 2024-08-11 DIAGNOSIS — E559 Vitamin D deficiency, unspecified: Secondary | ICD-10-CM

## 2024-08-11 LAB — ACUTE HEP PANEL AND HEP B SURFACE AB
Hep A IgM: NEGATIVE
Hep B C IgM: NEGATIVE
Hep C Virus Ab: NONREACTIVE
Hepatitis B Surf Ab Quant: <3.5 m[IU]/mL — ABNORMAL LOW
Hepatitis B Surface Ag: NEGATIVE

## 2024-08-11 LAB — CMP14+EGFR
ALT: 129 IU/L — ABNORMAL HIGH (ref 0–32)
AST: 37 IU/L (ref 0–40)
Albumin: 4.1 g/dL (ref 3.9–4.9)
Alkaline Phosphatase: 393 IU/L — ABNORMAL HIGH (ref 41–116)
BUN/Creatinine Ratio: 22 (ref 9–23)
BUN: 18 mg/dL (ref 6–24)
Bilirubin Total: 0.4 mg/dL (ref 0.0–1.2)
CO2: 22 mmol/L (ref 20–29)
Calcium: 10 mg/dL (ref 8.7–10.2)
Chloride: 96 mmol/L (ref 96–106)
Creatinine, Ser: 0.83 mg/dL (ref 0.57–1.00)
Globulin, Total: 2.5 g/dL (ref 1.5–4.5)
Glucose: 387 mg/dL — ABNORMAL HIGH (ref 70–99)
Potassium: 5.2 mmol/L (ref 3.5–5.2)
Sodium: 132 mmol/L — ABNORMAL LOW (ref 134–144)
Total Protein: 6.6 g/dL (ref 6.0–8.5)
eGFR: 87 mL/min/1.73 (ref >59)

## 2024-08-11 LAB — LIPASE: Lipase: 55 U/L (ref 14–72)

## 2024-08-11 LAB — VITAMIN D 25 HYDROXY (VIT D DEFICIENCY, FRACTURES): Vit D, 25-Hydroxy: 18.6 ng/mL — ABNORMAL LOW (ref 30.0–100.0)

## 2024-08-11 LAB — AMYLASE: Amylase: 45 U/L (ref 31–110)

## 2024-08-11 LAB — GAMMA GT: GGT: 1007 IU/L (ref 0–60)

## 2024-08-11 MED ORDER — VITAMIN D (ERGOCALCIFEROL) 1.25 MG (50000 UNIT) PO CAPS
50000.0000 [IU] | ORAL_CAPSULE | ORAL | 1 refills | Status: AC
  Filled 2024-08-11: qty 12, 84d supply, fill #0
  Filled 2024-09-01 – 2024-09-04 (×2): qty 12, 84d supply, fill #1
  Filled 2024-10-29: qty 12, 84d supply, fill #0
  Filled 2024-10-29: qty 12, 84d supply, fill #1

## 2024-08-13 ENCOUNTER — Inpatient Hospital Stay: Admission: RE | Admit: 2024-08-13 | Source: Ambulatory Visit

## 2024-08-18 ENCOUNTER — Other Ambulatory Visit

## 2024-08-19 ENCOUNTER — Ambulatory Visit
Admission: RE | Admit: 2024-08-19 | Discharge: 2024-08-19 | Disposition: A | Discharge status: Home / Self Care | Source: Ambulatory Visit | Attending: Nurse Practitioner | Admitting: Nurse Practitioner

## 2024-08-19 ENCOUNTER — Other Ambulatory Visit: Payer: Self-pay

## 2024-08-19 DIAGNOSIS — R748 Abnormal levels of other serum enzymes: Secondary | ICD-10-CM

## 2024-08-25 ENCOUNTER — Other Ambulatory Visit: Payer: Self-pay

## 2024-09-01 ENCOUNTER — Other Ambulatory Visit: Payer: Self-pay

## 2024-09-01 ENCOUNTER — Ambulatory Visit
Admission: RE | Admit: 2024-09-01 | Discharge: 2024-09-01 | Disposition: A | Source: Ambulatory Visit | Attending: Nurse Practitioner

## 2024-09-01 ENCOUNTER — Encounter: Payer: Self-pay | Admitting: Nurse Practitioner

## 2024-09-01 DIAGNOSIS — Z1231 Encounter for screening mammogram for malignant neoplasm of breast: Secondary | ICD-10-CM

## 2024-09-02 ENCOUNTER — Other Ambulatory Visit: Payer: Self-pay

## 2024-09-04 ENCOUNTER — Other Ambulatory Visit: Payer: Self-pay

## 2024-09-07 ENCOUNTER — Encounter: Payer: Self-pay | Admitting: Physical Medicine and Rehabilitation

## 2024-09-07 ENCOUNTER — Other Ambulatory Visit: Payer: Self-pay

## 2024-09-07 ENCOUNTER — Encounter: Payer: Self-pay | Admitting: Nurse Practitioner

## 2024-09-07 ENCOUNTER — Telehealth: Admitting: Nurse Practitioner

## 2024-09-07 ENCOUNTER — Ambulatory Visit: Admitting: Physical Medicine and Rehabilitation

## 2024-09-07 ENCOUNTER — Other Ambulatory Visit (HOSPITAL_COMMUNITY): Payer: Self-pay

## 2024-09-07 DIAGNOSIS — M797 Fibromyalgia: Secondary | ICD-10-CM | POA: Diagnosis not present

## 2024-09-07 DIAGNOSIS — M7918 Myalgia, other site: Secondary | ICD-10-CM

## 2024-09-07 DIAGNOSIS — M5442 Lumbago with sciatica, left side: Secondary | ICD-10-CM

## 2024-09-07 DIAGNOSIS — M5441 Lumbago with sciatica, right side: Secondary | ICD-10-CM

## 2024-09-07 DIAGNOSIS — G8929 Other chronic pain: Secondary | ICD-10-CM

## 2024-09-07 MED ORDER — DULOXETINE HCL 30 MG PO CPEP
ORAL_CAPSULE | ORAL | 2 refills | Status: AC
  Filled 2024-09-07: qty 60, 37d supply, fill #0
  Filled 2024-10-21: qty 60, 37d supply, fill #1
  Filled 2024-12-13: qty 60, 37d supply, fill #2

## 2024-09-07 NOTE — Progress Notes (Signed)
 [name] - 48 y.o. female MRN [id number]  Date of birth: September 18, 1976  Office Visit Note: Visit Date: 09/07/2024 PCP: Theotis Haze ORN, NP Referred by: Theotis Haze ORN, NP  Subjective: Chief Complaint  Patient presents with   Lower Back - Pain   HPI: Wendy Olson is a 48 y.o. female who comes in today per the request of Haze Theotis, NP for evaluation of chronic, worsening and severe bilateral lower back pain radiating to hips and down legs. Pain ongoing for many years, states her discomfort started when she was a teenager. Her pain worsens with prolonged standing, walking and sitting. She describes pain as pulling and squeezing sensation, currently rates as 10 out of 10. Some relief for pain with home exercise regimen, rest and use of medications. No history of formal physical therapy. Lumbar MRI imaging from 2024 shows mild bilateral neural foraminal narrowing at L5-S1. No spinal canal narrowing. No history of lumbar surgery/injections. Patient denies focal weakness, numbness and tingling. No recent trauma or falls.   Of note, patient reports diffuse pain all over her body for many years.    Patients course is complicated by diabetes mellitus, recent A1C on 08/10/2024 was 11.4.     Review of Systems  Musculoskeletal:  Positive for back pain and myalgias.  Neurological:  Negative for tingling, sensory change, focal weakness and weakness.  All other systems reviewed and are negative.  Otherwise per HPI.  Assessment & Plan: Visit Diagnoses:    ICD-10-CM   1. Chronic bilateral low back pain with bilateral sciatica  M54.42 Ambulatory referral to Physical Therapy   M54.41    G89.29     2. Myofascial pain syndrome  M79.18 Ambulatory referral to Physical Therapy    3. Fibromyalgia  M79.7 Ambulatory referral to Physical Therapy       Plan: Findings:  1. Chronic, worsening and severe bilateral lower back pain radiating to hips and legs. She continues to have severe pain  despite good conservative therapies such as home exercise regimen, rest and use of medications. I discussed prior lumbar MRI with her today using imaging and spine model. There is no high grade central canal stenosis. No disc herniation. No severe arthritis. Overall, her imaging looks fairly normal for her age, no structural abnormalities. Would not recommend interventional spine procedures at this time. If we did consider injection therapy her A1C would need to be 10 or below to minimize risk of infection.   2. Chronic diffuse body pain. I did have patient complete pain screening in the office today. Her scores for both widespread pain index and symptom severity do meet diagnostic criteria for fibromyalgia. We discussed treatment plan in detail today. I explained to her that our office does not solely treat fibromyalgia, however I briefly discussed management with her today including adequate sleep, physical activity and stress reduction. I prescribed Cymbalta for her to try. I informed her this medication can cause short term gastrointestinal issues. I also placed order for short course of formal physical therapy. I would like to see her back in approximately 8 weeks for re-evaluation. No red flag symptoms noted upon exam today.     Meds & Orders:  Meds ordered this encounter  Medications   DULoxetine (CYMBALTA) 30 MG capsule    Sig: Take 1 capsule (30 mg total) once a day by mouth for 2 weeks, then take 1 capsule (30 mg) twice a day.    Dispense:  60 capsule    Refill:  2    Orders Placed This Encounter  Procedures   Ambulatory referral to Physical Therapy    Follow-up: Return for 8 week follow up for re-evaluation.   Procedures: No procedures performed      Clinical History: CLINICAL DATA:  Low back pain, cauda equina syndrome suspected low back pain, left leg weakness.   EXAM: MRI LUMBAR SPINE WITHOUT CONTRAST   TECHNIQUE: Multiplanar, multisequence MR imaging of the lumbar spine  was performed. No intravenous contrast was administered.   COMPARISON:  None Available.   FINDINGS: Segmentation:  Standard.   Alignment:  Physiologic.   Vertebrae:  No fracture, evidence of discitis, or bone lesion.   Conus medullaris and cauda equina: Conus extends to the L2 level. Conus and cauda equina appear normal.   Paraspinal and other soft tissues: Negative.   Disc levels:   T12-L1: Unremarkable   L1-L2: Unremarkable   L2-L3: Unremarkable   L3-L4: Unremarkable   L4-L5: Mild bilateral facet degenerative change. No spinal canal narrowing. No neural foraminal narrowing. No significant disc bulge.   L5-S1: Mild bilateral facet degenerative change. No significant disc bulge. No spinal narrowing. Mild bilateral neural foraminal narrowing.   IMPRESSION: 1. No acute abnormality of the lumbar spine. 2. Mild bilateral neural foraminal narrowing at L5-S1. No spinal canal narrowing.     Electronically Signed   By: Lyndall Gore M.D.   On: 09/13/2023 11:59   She reports that she quit smoking about 17 years ago. Her smoking use included cigarettes. She has never used smokeless tobacco.  Recent Labs    10/28/23 1553 04/21/24 0410 08/10/24 1723  HGBA1C 13.2* 11.6* 11.4*    Objective:  VS:  HT:    WT:   BMI:     BP:   HR: bpm  TEMP: ( )  RESP:  Physical Exam Vitals and nursing note reviewed.  HENT:     Head: Normocephalic and atraumatic.     Right Ear: External ear normal.     Left Ear: External ear normal.     Nose: Nose normal.     Mouth/Throat:     Mouth: Mucous membranes are moist.  Eyes:     Extraocular Movements: Extraocular movements intact.  Cardiovascular:     Rate and Rhythm: Normal rate.     Pulses: Normal pulses.  Pulmonary:     Effort: Pulmonary effort is normal.  Abdominal:     General: Abdomen is flat. There is no distension.  Musculoskeletal:        General: Tenderness present.     Cervical back: Normal range of motion.      Comments: Today's exam difficult likely due to decreased effort and pain. Patient rises from seated position to standing without difficulty. Good lumbar range of motion. No pain noted with facet loading. 5/5 strength noted with bilateral hip flexion, knee flexion/extension, ankle dorsiflexion/plantarflexion and EHL. No clonus noted bilaterally. No pain upon palpation of greater trochanters. No pain with internal/external rotation of bilateral hips. Sensation intact bilaterally. Myofascial tenderness noted upon palpation of bilateral lumbar and thoracic paraspinal regions. She is particularly tender to touch diffusely. Negative slump test bilaterally. Ambulates without aid, gait steady.     Skin:    General: Skin is warm and dry.     Capillary Refill: Capillary refill takes less than 2 seconds.  Neurological:     General: No focal deficit present.     Mental Status: She is alert and oriented to person, place, and time.  Psychiatric:  Mood and Affect: Mood normal.        Behavior: Behavior normal.     Ortho Exam  Imaging: No results found.  Past Medical/Family/Surgical/Social History: Medications & Allergies reviewed per EMR, new medications updated. Patient Active Problem List   Diagnosis Date Noted   Candidiasis of female genitalia 04/21/2024   Hypophosphatemia 04/21/2024   Acute pyelonephritis 04/20/2024   Essential hypertension 04/20/2024   Hyperlipidemia 04/20/2024   GERD (gastroesophageal reflux disease) 04/20/2024   Hematuria 04/20/2024   Dehydration 04/20/2024   Hyponatremia 04/20/2024   Acidosis, metabolic 04/20/2024   Constipation 04/20/2024   Type 2 diabetes mellitus with diabetic polyneuropathy, with long-term current use of insulin  (HCC) 10/28/2023   Sore throat 12/30/2016   Hypertension 12/14/2015   Type 2 diabetes mellitus with hyperglycemia, without long-term current use of insulin  (HCC) 11/01/2015   Past Medical History:  Diagnosis Date   Allergy     seasonal   Allergy to lobster    Anxiety    Asthma    Diabetes mellitus without complication (HCC)    type 2   Hyperlipidemia    on meds   Hypertension    on meds   Seasonal allergies    Family History  Problem Relation Age of Onset   Diabetes Mother    Hypertension Mother    Dementia Mother    Pancreatic cancer Father    Diabetes Father    Hypertension Father    Colon cancer Neg Hx    Colon polyps Neg Hx    Esophageal cancer Neg Hx    Rectal cancer Neg Hx    Stomach cancer Neg Hx    Breast cancer Neg Hx    Past Surgical History:  Procedure Laterality Date   TUBAL LIGATION  2009   Social History   Occupational History   Not on file  Tobacco Use   Smoking status: Former    Current packs/day: 0.00    Types: Cigarettes    Quit date: 01/25/2007    Years since quitting: 17.6   Smokeless tobacco: Never  Vaping Use   Vaping status: Never Used  Substance and Sexual Activity   Alcohol use: No   Drug use: No   Sexual activity: Yes    Birth control/protection: Surgical    Comment: tubal ligation

## 2024-09-07 NOTE — Progress Notes (Signed)
 Pain Scale   Average Pain 9 Patient advising she has chronic lower back pain radiating to both legs and pain is constant.        +Driver, -BT, -Dye Allergies.

## 2024-09-08 ENCOUNTER — Other Ambulatory Visit: Payer: Self-pay

## 2024-09-09 ENCOUNTER — Ambulatory Visit: Payer: Self-pay | Admitting: Nurse Practitioner

## 2024-09-10 ENCOUNTER — Other Ambulatory Visit: Payer: Self-pay

## 2024-09-10 ENCOUNTER — Encounter: Payer: Self-pay | Admitting: Nurse Practitioner

## 2024-09-11 ENCOUNTER — Other Ambulatory Visit: Payer: Self-pay

## 2024-09-14 ENCOUNTER — Other Ambulatory Visit: Payer: Self-pay

## 2024-09-15 ENCOUNTER — Telehealth: Payer: Self-pay | Admitting: Physical Medicine and Rehabilitation

## 2024-09-15 ENCOUNTER — Other Ambulatory Visit: Payer: Self-pay

## 2024-09-15 NOTE — Telephone Encounter (Signed)
 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 I may have spelled it wrong. She said she didn't mention it to Megan and wants to know if there is something else she can give. CB#[phone]

## 2024-09-18 ENCOUNTER — Other Ambulatory Visit: Payer: Self-pay

## 2024-09-19 ENCOUNTER — Emergency Department (HOSPITAL_COMMUNITY)
Admission: EM | Admit: 2024-09-19 | Discharge: 2024-09-19 | Disposition: A | Discharge status: Home / Self Care | Attending: Emergency Medicine | Admitting: Emergency Medicine

## 2024-09-19 ENCOUNTER — Other Ambulatory Visit: Payer: Self-pay

## 2024-09-19 ENCOUNTER — Encounter (HOSPITAL_COMMUNITY): Payer: Self-pay | Admitting: *Deleted

## 2024-09-19 ENCOUNTER — Emergency Department (HOSPITAL_COMMUNITY)

## 2024-09-19 DIAGNOSIS — W010XXA Fall on same level from slipping, tripping and stumbling without subsequent striking against object, initial encounter: Secondary | ICD-10-CM | POA: Insufficient documentation

## 2024-09-19 DIAGNOSIS — M25551 Pain in right hip: Secondary | ICD-10-CM | POA: Diagnosis present

## 2024-09-19 DIAGNOSIS — M25522 Pain in left elbow: Secondary | ICD-10-CM | POA: Insufficient documentation

## 2024-09-19 DIAGNOSIS — M25512 Pain in left shoulder: Secondary | ICD-10-CM | POA: Diagnosis not present

## 2024-09-19 DIAGNOSIS — W19XXXA Unspecified fall, initial encounter: Secondary | ICD-10-CM

## 2024-09-19 DIAGNOSIS — Z794 Long term (current) use of insulin: Secondary | ICD-10-CM | POA: Insufficient documentation

## 2024-09-19 MED ORDER — OXYCODONE-ACETAMINOPHEN 5-325 MG PO TABS: 1.0000 | ORAL_TABLET | Freq: Four times a day (QID) | ORAL | 0 refills | Status: DC | PRN

## 2024-09-19 MED ORDER — OXYCODONE-ACETAMINOPHEN 5-325 MG PO TABS
2.0000 | ORAL_TABLET | Freq: Once | ORAL | Status: AC
  Administered 2024-09-19: 2 via ORAL
  Filled 2024-09-19: qty 2

## 2024-09-19 NOTE — ED Provider Notes (Signed)
 [facility] EMERGENCY DEPARTMENT AT [facility] Provider Note   CSN: [id number] Arrival date & time: 09/19/24  9267     Patient presents with: [place]   Wendy Olson is a 48 y.o. female patient who presents to the emergency department today for further evaluation after a mechanical slip and fall that occurred this morning.  Patient went to the bathroom when she hit a wet spot and did a split.  She states that she tried to catch herself with her left arm but her left arm gave out and fell onto her right side.  She did not hit her head or lose consciousness.  Patient is not anticoagulated.  She is reporting left shoulder and elbow pain in addition to right hip pain.  Right hip pain does radiate down into the lateral compartment of the thigh.  She denies any back pain.    Fall       Prior to Admission medications   Medication Sig Start Date End Date Taking? Authorizing Provider  oxyCODONE -acetaminophen  (PERCOCET/ROXICET) 5-325 MG tablet Take 1 tablet by mouth every 6 (six) hours as needed for severe pain (pain score 7-10). 09/19/24  Yes Fount Bahe M, PA-C  atorvastatin  (LIPITOR) 40 MG tablet Take 1 tablet (40 mg total) by mouth daily. 06/08/24   Zailey Audia, Zelda W, NP  Blood Glucose Monitoring Suppl (BLOOD GLUCOSE MONITOR SYSTEM) w/Device KIT Use as directed for checking blood sugar 04/22/24   Cindy Garnette POUR, MD  busPIRone  (BUSPAR ) 10 MG tablet Take 1 tablet (10 mg total) by mouth 2 (two) times daily. FOR ANXIETY 08/10/24   Rameses Ou, Zelda W, NP  Continuous Glucose Receiver (DEXCOM G7 RECEIVER) DEVI Check blood glucose levels continuously.  E11.65  Z79.4 07/24/24   Theotis Haze ORN, NP  Continuous Glucose Sensor (DEXCOM G7 SENSOR) MISC Check blood glucose levels continuously.  Change sensor every 10 days. 06/08/24   Siya Flurry, Zelda W, NP  Dulaglutide  (TRULICITY ) 0.75 MG/0.5ML SOAJ Inject 0.75 mg into the skin once a week. 08/10/24   Jaclynn Laumann, Zelda W, NP  DULoxetine (CYMBALTA) 30 MG  capsule Take 1 capsule (30 mg total) once a day by mouth for 2 weeks, then take 1 capsule (30 mg) twice a day. 09/07/24   Williams, Megan E, NP  gabapentin  (NEURONTIN ) 300 MG capsule Take 1 capsule (300 mg total) by mouth 3 (three) times daily. Patient not taking: Reported on 08/10/2024 06/08/24   Rhesa Forsberg, Zelda W, NP  glipiZIDE  (GLUCOTROL ) 5 MG tablet Take 1 tablet (5 mg total) by mouth 2 (two) times daily before a meal. 08/10/24   Theotis Haze ORN, NP  Glucose Blood (BLOOD GLUCOSE TEST STRIPS) STRP Use to check blood sugar three (3) times daily. 04/22/24   Cindy Garnette POUR, MD  insulin  isophane & regular human KwikPen (NOVOLIN  70/30 KWIKPEN) (70-30) 100 UNIT/ML KwikPen Inject 16 Units into the skin 2 (two) times daily with a meal. 06/08/24   Marsia Cino, Zelda W, NP  Insulin  Pen Needle (TECHLITE PEN NEEDLES) 31G X 5 MM MISC Use to inject insulin  twice daily 07/27/24   Newlin, Enobong, MD  Lancet Device MISC 1 each by Does not apply route 3 (three) times daily. May dispense any manufacturer covered by patient's insurance. 04/22/24   Cindy Garnette POUR, MD  Lancets MISC Use one (1) lancet three (3) times daily to check blood sugar 04/22/24   Cindy Garnette POUR, MD  lisinopril  (ZESTRIL ) 20 MG tablet Take 1 tablet (20 mg total) by mouth daily. 06/08/24  Theotis Haze ORN, NP  metoprolol  succinate (TOPROL -XL) 25 MG 24 hr tablet Take 1 tablet (25 mg total) by mouth daily. 06/08/24   Theotis Haze ORN, NP  TYLENOL  8 HOUR ARTHRITIS PAIN 650 MG CR tablet Take 1,300 mg by mouth every 8 (eight) hours as needed for pain.    [provider]  Vitamin D , Ergocalciferol , (DRISDOL ) 1.25 MG (50000 UNIT) CAPS capsule Take 1 capsule (50,000 Units total) by mouth every 7 (seven) days. 08/11/24   Theotis Haze ORN, NP    Allergies: Char cerise allergy] and Reglan  [metoclopramide ]    Review of Systems  All other systems reviewed and are negative.   Updated Vital Signs BP 118/74 (BP Location: Left Arm)   Pulse 91   Temp  97.8 F (36.6 C) (Oral)   Resp 16   Ht 5' 6 (1.676 m)   Wt 99.3 kg   SpO2 98%   BMI 35.35 kg/m   Physical Exam Vitals and nursing note reviewed.  Constitutional:      General: She is not in acute distress.    Appearance: Normal appearance.  HENT:     Head: Normocephalic and atraumatic.  Eyes:     General:        Right eye: No discharge.        Left eye: No discharge.  Cardiovascular:     Comments: Regular rate and rhythm.  S1/S2 are distinct without any evidence of murmur, rubs, or gallops.  Radial pulses are 2+ bilaterally.  Dorsalis pedis pulses are 2+ bilaterally.  No evidence of pedal edema. Pulmonary:     Comments: Clear to auscultation bilaterally.  Normal effort.  No respiratory distress.  No evidence of wheezes, rales, or rhonchi heard throughout. Abdominal:     General: Abdomen is flat. Bowel sounds are normal. There is no distension.     Tenderness: There is no abdominal tenderness. There is no guarding or rebound.  Musculoskeletal:        General: Normal range of motion.     Cervical back: Neck supple.     Comments: There is some tenderness to palpation over the left shoulder with some mild swelling.  Patient has arm flexed at the elbow and held close to the body secondary to pain.  There is tenderness to the left elbow.  2+ radial pulses felt bilaterally and are equal.  Good cap refill.  Neurologically intact.  Also patient has some right hip tenderness to palpation.  No obvious deformity.  She has dorsalis pedis pulses felt bilaterally 2+.  Skin:    General: Skin is warm and dry.     Findings: No rash.  Neurological:     General: No focal deficit present.     Mental Status: She is alert.  Psychiatric:        Mood and Affect: Mood normal.        Behavior: Behavior normal.     (all labs ordered are listed, but only abnormal results are displayed) Labs Reviewed - No data to display  EKG: None  Radiology: DG Wrist Complete Left Result Date:  09/19/2024 CLINICAL DATA:  Status post fall. EXAM: LEFT WRIST - COMPLETE 3+ VIEW COMPARISON:  February 02, 2011 FINDINGS: There is no evidence of fracture or dislocation. There is no evidence of arthropathy or other focal bone abnormality. Soft tissues are unremarkable. IMPRESSION: Negative. Electronically Signed   By: Suzen Dials M.D.   On: 09/19/2024 09:21   DG Hip Unilat W or Wo Pelvis  2-3 Views Right Result Date: 09/19/2024 CLINICAL DATA:  Status post fall. EXAM: DG HIP (WITH OR WITHOUT PELVIS) 2-3V RIGHT COMPARISON:  None Available. FINDINGS: There is no evidence of hip fracture or dislocation. There is no evidence of arthropathy or other focal bone abnormality. IMPRESSION: Negative. Electronically Signed   By: Suzen Dials M.D.   On: 09/19/2024 09:20   DG Elbow Complete Left Result Date: 09/19/2024 CLINICAL DATA:  Status post fall. EXAM: LEFT ELBOW - COMPLETE 3+ VIEW COMPARISON:  None Available. FINDINGS: There is no evidence of fracture, dislocation, or joint effusion. There is no evidence of arthropathy or other focal bone abnormality. Soft tissues are unremarkable. IMPRESSION: Negative. Electronically Signed   By: Suzen Dials M.D.   On: 09/19/2024 09:19   DG Shoulder Left Result Date: 09/19/2024 CLINICAL DATA:  Status post fall. EXAM: LEFT SHOULDER - 2+ VIEW COMPARISON:  None Available. FINDINGS: A very small cortical irregularity of indeterminate age is seen adjacent to the greater tubercle of the left humeral head. There is no evidence of dislocation. There is no evidence of arthropathy or other focal bone abnormality. Soft tissues are unremarkable. IMPRESSION: Findings which may represent a very small fracture of indeterminate age adjacent to the greater tubercle of the left humeral head. Correlation with physical examination is recommended to determine the presence of point tenderness. Electronically Signed   By: Suzen Dials M.D.   On: 09/19/2024 09:17     Procedures    Medications Ordered in the ED  oxyCODONE -acetaminophen  (PERCOCET/ROXICET) 5-325 MG per tablet 2 tablet (2 tablets Oral Given 09/19/24 0816)    Clinical Course as of 09/19/24 0953  Sat Sep 19, 2024  0949 I went over all imaging with the patient at the bedside.  Sling is in place.  Pain is controlled.  Patient agreeable with plan for discharge.  She will follow-up with orthopedics. [CF]    Clinical Course User Index [CF] Theotis Cameron HERO, PA-C    Medical Decision Making Wendy Olson is a 48 y.o. female patient who presents to the emergency department today for further evaluation of slip and fall.  Low suspicion for intracranial hemorrhage or cervical spinal pathology at this time.  Somewhat concern for possible fracture or contusion or muscular strain in the left upper extremity and right hip.  Will give her 2 Percocet and get some imaging to assess.  Imaging over the left wrist, left elbow, and right hip are normal.  I personally reviewed these and I do agree with the radiologist interpretation.  There is some questionable fracture over the shoulder on imaging.  Unclear whether or not this is from the fall or not.  She does have some mild tenderness there.  Will place in this and sling and have her follow-up with orthopedics.  Breakthrough pain was prescribed.  She will also take ibuprofen .  Strict return precautions were discussed.  She is safe for discharge at this time.  Amount and/or Complexity of Data Reviewed Radiology: ordered.  Risk Prescription drug management.     Final diagnoses:  Fall, initial encounter  Right hip pain  Acute pain of left shoulder    ED Discharge Orders          Ordered    oxyCODONE -acetaminophen  (PERCOCET/ROXICET) 5-325 MG tablet  Every 6 hours PRN        09/19/24 0951               Theotis Cameron HERO, PA-C 09/19/24 0953    Messick,  Maude BROCKS, MD 09/19/24 1352

## 2024-09-19 NOTE — ED Triage Notes (Addendum)
 Pt fell last night in bathroom, sore all over, pain specifically in left arm, back and hip on rt side. Pt was able to get self off the floor. Slip and fall. No LOC

## 2024-09-19 NOTE — Progress Notes (Signed)
 Orthopedic Tech Progress Note Patient Details:  Wendy Olson 1976/10/13 [id number]  Ortho Devices Type of Ortho Device: Shoulder immobilizer Ortho Device/Splint Location: LUE Ortho Device/Splint Interventions: Ordered, Application, Adjustment   Post Interventions Patient Tolerated: Well Instructions Provided: Care of device, Adjustment of device  Wendy Olson 09/19/2024, 9:42 AM

## 2024-09-19 NOTE — Discharge Instructions (Signed)
 I have sent pain medication to your pharmacy.  You could also take 600 mg of ibuprofen  every 6 hours as needed for pain.  I would like for you to follow-up with orthopedics for further evaluation.  You can call and schedule an appointment.  You may return to the emergency department for any worsening symptoms.

## 2024-09-21 ENCOUNTER — Other Ambulatory Visit: Payer: Self-pay

## 2024-09-21 ENCOUNTER — Encounter (HOSPITAL_COMMUNITY): Payer: Self-pay

## 2024-09-21 ENCOUNTER — Emergency Department (HOSPITAL_COMMUNITY)
Admission: EM | Admit: 2024-09-21 | Discharge: 2024-09-21 | Disposition: A | Discharge status: Home / Self Care | Attending: Emergency Medicine | Admitting: Emergency Medicine

## 2024-09-21 ENCOUNTER — Emergency Department (HOSPITAL_COMMUNITY)

## 2024-09-21 DIAGNOSIS — Y9301 Activity, walking, marching and hiking: Secondary | ICD-10-CM | POA: Diagnosis not present

## 2024-09-21 DIAGNOSIS — X501XXA Overexertion from prolonged static or awkward postures, initial encounter: Secondary | ICD-10-CM | POA: Insufficient documentation

## 2024-09-21 DIAGNOSIS — S86001A Unspecified injury of right Achilles tendon, initial encounter: Secondary | ICD-10-CM | POA: Diagnosis not present

## 2024-09-21 DIAGNOSIS — M79604 Pain in right leg: Secondary | ICD-10-CM | POA: Diagnosis present

## 2024-09-21 MED ORDER — HYDROCODONE-ACETAMINOPHEN 5-325 MG PO TABS
2.0000 | ORAL_TABLET | Freq: Once | ORAL | Status: AC
  Administered 2024-09-21: 2 via ORAL
  Filled 2024-09-21: qty 2

## 2024-09-21 MED ORDER — OXYCODONE-ACETAMINOPHEN 5-325 MG PO TABS: 1.0000 | ORAL_TABLET | ORAL | 0 refills | Status: AC | PRN

## 2024-09-21 NOTE — ED Triage Notes (Signed)
 Patient said she was walking normal. When she took another step her right leg made a pop noise. Has right hip pain, right knee pain, and her right achilles burns. No falls.

## 2024-09-21 NOTE — Discharge Instructions (Addendum)
 Return if any problems.

## 2024-09-21 NOTE — ED Provider Notes (Signed)
 [place] EMERGENCY DEPARTMENT AT [facility] Provider Note   CSN: [id number] Arrival date & time: 09/21/24  1825     Patient presents with: Leg Pain   Wendy Olson is a 48 y.o. female.   Patient reports she was walking and had a pop in her right lower leg.  Patient states she has been unable to tolerate walking since.  Patient reports swelling since the popping sensation.  Patient had a fall 2 days ago but she did not injure her leg.  Patient reports that she has fibromyalgia and she is followed by an orthopedist.  Patient is currently in physical therapy.  Patient points to the back of her calf down to the back of her ankle as the area of pain.  The history is provided by the patient. No language interpreter was used.  Leg Pain Location:  Leg Leg location:  R lower leg Pain details:    Quality:  Aching   Severity:  Moderate   Timing:  Constant   Progression:  Worsening Chronicity:  New Relieved by:  Nothing Worsened by:  Nothing Ineffective treatments:  None tried Associated symptoms: decreased ROM   Risk factors: no concern for non-accidental trauma        Prior to Admission medications   Medication Sig Start Date End Date Taking? Authorizing Provider  atorvastatin  (LIPITOR) 40 MG tablet Take 1 tablet (40 mg total) by mouth daily. 06/08/24   Fleming, Zelda W, NP  Blood Glucose Monitoring Suppl (BLOOD GLUCOSE MONITOR SYSTEM) w/Device KIT Use as directed for checking blood sugar 04/22/24   Cindy Garnette POUR, MD  busPIRone  (BUSPAR ) 10 MG tablet Take 1 tablet (10 mg total) by mouth 2 (two) times daily. FOR ANXIETY 08/10/24   Fleming, Zelda W, NP  Continuous Glucose Receiver (DEXCOM G7 RECEIVER) DEVI Check blood glucose levels continuously.  E11.65  Z79.4 07/24/24   Theotis Haze ORN, NP  Continuous Glucose Sensor (DEXCOM G7 SENSOR) MISC Check blood glucose levels continuously.  Change sensor every 10 days. 06/08/24   Fleming, Zelda W, NP  Dulaglutide  (TRULICITY ) 0.75  MG/0.5ML SOAJ Inject 0.75 mg into the skin once a week. 08/10/24   Fleming, Zelda W, NP  DULoxetine (CYMBALTA) 30 MG capsule Take 1 capsule (30 mg total) once a day by mouth for 2 weeks, then take 1 capsule (30 mg) twice a day. 09/07/24   Williams, Megan E, NP  gabapentin  (NEURONTIN ) 300 MG capsule Take 1 capsule (300 mg total) by mouth 3 (three) times daily. Patient not taking: Reported on 08/10/2024 06/08/24   Fleming, Zelda W, NP  glipiZIDE  (GLUCOTROL ) 5 MG tablet Take 1 tablet (5 mg total) by mouth 2 (two) times daily before a meal. 08/10/24   Theotis Haze ORN, NP  Glucose Blood (BLOOD GLUCOSE TEST STRIPS) STRP Use to check blood sugar three (3) times daily. 04/22/24   Cindy Garnette POUR, MD  insulin  isophane & regular human KwikPen (NOVOLIN  70/30 KWIKPEN) (70-30) 100 UNIT/ML KwikPen Inject 16 Units into the skin 2 (two) times daily with a meal. 06/08/24   Fleming, Zelda W, NP  Insulin  Pen Needle (TECHLITE PEN NEEDLES) 31G X 5 MM MISC Use to inject insulin  twice daily 07/27/24   Newlin, Enobong, MD  Lancet Device MISC 1 each by Does not apply route 3 (three) times daily. May dispense any manufacturer covered by patient's insurance. 04/22/24   Cindy Garnette POUR, MD  Lancets MISC Use one (1) lancet three (3) times daily to check blood sugar  04/22/24   Cindy Garnette POUR, MD  lisinopril  (ZESTRIL ) 20 MG tablet Take 1 tablet (20 mg total) by mouth daily. 06/08/24   Fleming, Zelda W, NP  metoprolol  succinate (TOPROL -XL) 25 MG 24 hr tablet Take 1 tablet (25 mg total) by mouth daily. 06/08/24   Fleming, Zelda W, NP  oxyCODONE -acetaminophen  (PERCOCET/ROXICET) 5-325 MG tablet Take 1 tablet by mouth every 6 (six) hours as needed for severe pain (pain score 7-10). 09/19/24   Theotis, Conner M, PA-C  TYLENOL  8 HOUR ARTHRITIS PAIN 650 MG CR tablet Take 1,300 mg by mouth every 8 (eight) hours as needed for pain.    [provider]  Vitamin D , Ergocalciferol , (DRISDOL ) 1.25 MG (50000 UNIT) CAPS capsule Take 1 capsule (50,000  Units total) by mouth every 7 (seven) days. 08/11/24   Theotis Haze ORN, NP    Allergies: Char cerise allergy] and Reglan  [metoclopramide ]    Review of Systems  All other systems reviewed and are negative.   Updated Vital Signs BP 127/77 (BP Location: Right Arm)   Pulse 98   Temp 98 F (36.7 C) (Oral)   Resp 16   Ht 5' 6 (1.676 m)   Wt 99.8 kg   SpO2 97%   BMI 35.51 kg/m   Physical Exam Vitals and nursing note reviewed.  Constitutional:      Appearance: She is well-developed.  HENT:     Head: Normocephalic.  Cardiovascular:     Rate and Rhythm: Normal rate.  Pulmonary:     Effort: Pulmonary effort is normal.  Abdominal:     General: There is no distension.  Musculoskeletal:        General: Swelling and tenderness present.     Comments: Tender achilles, swelling calf muscle,  pain with calf squeeze, decreased range of motion ankle. Nv and ns intact   Skin:    General: Skin is warm.  Neurological:     General: No focal deficit present.     Mental Status: She is alert and oriented to person, place, and time.     (all labs ordered are listed, but only abnormal results are displayed) Labs Reviewed - No data to display  EKG: None  Radiology: DG Tibia/Fibula Right Result Date: 09/21/2024 EXAM: 2 VIEW(S) XRAY OF THE RIGHT TIBIA AND FIBULA 09/21/2024 09:50:44 PM COMPARISON: None available. CLINICAL HISTORY: Injury. Patient states she twisted her leg this pm and felt a pop, entire leg is painful and she cannot bear weight. FINDINGS: BONES AND JOINTS: No acute fracture. No focal osseous lesion. No joint dislocation. SOFT TISSUES: The soft tissues are unremarkable. IMPRESSION: 1. No significant abnormality. Electronically signed by: Greig Pique MD 09/21/2024 09:54 PM EDT RP Workstation: [id number]     Procedures   Medications Ordered in the ED  HYDROcodone -acetaminophen  (NORCO/VICODIN) 5-325 MG per tablet 2 tablet (has no administration in time range)                                     Medical Decision Making Pt complains of pain in her right ankle and right calf.  Pt complains of inability to walk.   Amount and/or Complexity of Data Reviewed Radiology: ordered and independent interpretation performed. Decision-making details documented in ED Course.    Details: Xray Right tibia, no fracture   Risk Prescription drug management. Risk Details: Pt counseled on possible achilles rupture.  Pt advised to follow up with her Orthopaedist  for evaluation.  Ice to area of swelling and pain.  Pt given rx for pain medication         Final diagnoses:  Injury of right Achilles tendon, initial encounter    ED Discharge Orders          Ordered    oxyCODONE -acetaminophen  (PERCOCET) 5-325 MG tablet  Every 4 hours PRN        09/21/24 2225           An After Visit Summary was printed and given to the patient.     Rashawn Rayman K, PA-C 09/21/24 2225    Patsey Lot, MD 09/21/24 2303

## 2024-09-21 NOTE — Telephone Encounter (Signed)
 Noted

## 2024-09-22 ENCOUNTER — Ambulatory Visit: Admitting: Physical Therapy

## 2024-09-28 ENCOUNTER — Encounter: Payer: Self-pay | Admitting: Radiology

## 2024-09-28 NOTE — Therapy (Signed)
 OUTPATIENT PHYSICAL THERAPY THORACOLUMBAR EVALUATION   Patient Name: Wendy Olson MRN: [id number] DOB:Jul 24, 1976, 48 y.o., female Today's Date: 10/01/2024  END OF SESSION:  PT End of Session - 10/01/24 0827     Visit Number 1    Number of Visits 17    Date for Recertification  11/25/24    Authorization Type [employer]    PT Start Time 1015    PT Stop Time 1055    PT Time Calculation (min) 40 min    Activity Tolerance Patient limited by pain;Patient limited by fatigue    Behavior During Therapy Anxious          Past Medical History:  Diagnosis Date   Allergy    seasonal   Allergy to lobster    Anxiety    Asthma    Diabetes mellitus without complication (HCC)    type 2   Hyperlipidemia    on meds   Hypertension    on meds   Seasonal allergies    Past Surgical History:  Procedure Laterality Date   TUBAL LIGATION  2009   Patient Active Problem List   Diagnosis Date Noted   Candidiasis of female genitalia 04/21/2024   Hypophosphatemia 04/21/2024   Acute pyelonephritis 04/20/2024   Essential hypertension 04/20/2024   Hyperlipidemia 04/20/2024   GERD (gastroesophageal reflux disease) 04/20/2024   Hematuria 04/20/2024   Dehydration 04/20/2024   Hyponatremia 04/20/2024   Acidosis, metabolic 04/20/2024   Constipation 04/20/2024   Type 2 diabetes mellitus with diabetic polyneuropathy, with long-term current use of insulin  (HCC) 10/28/2023   Sore throat 12/30/2016   Hypertension 12/14/2015   Type 2 diabetes mellitus with hyperglycemia, without long-term current use of insulin  (HCC) 11/01/2015    PCP: Theotis Haze ORN, NP  REFERRING PROVIDER: Trudy Duwaine BRAVO, NP  REFERRING DIAG:  M54.42,M54.41,G89.29 (ICD-10-CM) - Chronic bilateral low back pain with bilateral sciatica M79.18 (ICD-10-CM) - Myofascial pain syndrome M79.7 (ICD-10-CM) - Fibromyalgia  Rationale for Evaluation and Treatment: Rehabilitation  THERAPY DIAG:  Other low back  pain  Pain in left leg  Pain in right leg  Other abnormalities of gait and mobility  Muscle weakness (generalized)  ONSET DATE: Chronic - recent exacerbation   SUBJECTIVE:                                                                                                                                                                                           SUBJECTIVE STATEMENT: Pt presents to PT with spouse, reports chronic hx of LBP with referral of pain and N/T down bilateral posterior LE. Pain is very severe and interferes with many activities.  She has dx of fibromyalgia and is very fearful of exercise and movement overall. Unfortunately today's evaluation and subjective was impacted by recent R achilles injury, she was told at MD to be NWB until she sees othro on 10/06/24.   PERTINENT HISTORY:  HTN, DM II, Fibromyalgia  PAIN:  Are you having pain?  Yes: NPRS scale: 9/10 Worst: 10/10 Pain location: lower back, B LE Pain description: sharp, sore, N/T Aggravating factors: prolonged standing/sitting, walking, lifting Relieving factors: heat, rest, position   PRECAUTIONS: Fall  RED FLAGS: None   WEIGHT BEARING RESTRICTIONS: No  FALLS:  Has patient fallen in last 6 months? Yes. Number of falls - one fall leading to ED visit  LIVING ENVIRONMENT: Lives with: lives with their spouse Lives in: Other - Hotel Stairs: Yes: External: 17 steps; can reach both Has following equipment at home: Crutches and knee scooter, CAM boot  OCCUPATION: Not working  PLOF: Independent  PATIENT GOALS: decrease pain, be able to get around and walk better  NEXT MD VISIT: 10/06/2024  OBJECTIVE:  Note: Objective measures were completed at Evaluation unless otherwise noted.  DIAGNOSTIC FINDINGS:  See imaging   PATIENT SURVEYS:  PSFS: THE PATIENT SPECIFIC FUNCTIONAL SCALE  Place score of 0-10 (0 = unable to perform activity and 10 = able to perform activity at the same level as before  injury or problem)  Activity Date: 09/30/2024    Sleep  4    2. Walking 2    3. Bending over/lifting 4    4.      Total Score 3.33      Total Score = Sum of activity scores/number of activities  Minimally Detectable Change: 3 points (for single activity); 2 points (for average score)  [facility] (nd). The Patient Specific Functional Scale . Retrieved from [email or web address]   COGNITION: Overall cognitive status: Within functional limits for tasks assessed     SENSATION: Light touch: Impaired - bilateral LE N/T  POSTURE: rounded shoulders, forward head, increased lumbar lordosis, and fx boot on R LE  PALPATION: Significant TTP to bilateral lumbar paraspinals, bilateral gluteals  LUMBAR ROM:   AROM eval  Flexion 25%  Extension 0%  Right lateral flexion   Left lateral flexion   Right rotation 25%  Left rotation 25%   (Blank rows = not tested)  LOWER EXTREMITY MMT:    MMT Right eval Left eval  Hip flexion 2+ 2+  Hip extension    Hip abduction 2+ 2+  Hip adduction    Hip internal rotation    Hip external rotation    Knee flexion 2+ 2+  Knee extension 2+ 2+  Ankle dorsiflexion    Ankle plantarflexion    Ankle inversion    Ankle eversion     (Blank rows = not tested)  LUMBAR SPECIAL TESTS:  Slump test: Positive  FUNCTIONAL TESTS:  Could not assess due to R LE in boot and told to be NWB  GAIT: Distance walked: 45ft Assistive device utilized: knee scooter and CAM boot on R foot Level of assistance: Min A Comments: unsteady gait using knee scooter - PT assist with scooter navigation and sequencing while ambulating to treatment room  TREATMENT: [facility] Adult PT Treatment:  DATE: 09/29/2024 Therapeutic Exercise: Seated bilateral ER x 5 RTB Seated ball squeeze x 5  Seated clamshell x 5 YTB  PATIENT EDUCATION:  Education details: eval findings,  PSFS, Fibromyalgia and exercise, HEP, POC Person educated: Patient and Spouse Education method: Explanation, Demonstration, and Handouts Education comprehension: verbalized understanding and returned demonstration  HOME EXERCISE PROGRAM: Access Code: [email or web address] URL: [email or web address] Date: 09/29/2024 Prepared by: Alm Kingdom  Exercises - Shoulder External Rotation and Scapular Retraction with Resistance  - 1 x daily - 7 x weekly - 2-3 sets - 10 reps - yellow band hold - Seated Hip Adduction Squeeze with Ball  - 1 x daily - 7 x weekly - 2-3 sets - 10 reps - 3 sec hold - Seated Hip Abduction with Resistance  - 1 x daily - 7 x weekly - 3 sets - 10 reps - yellow band hold  ASSESSMENT:  CLINICAL IMPRESSION: Patient is a 48 y.o. F who was seen today for physical therapy evaluation and treatment for chronic LBP with referral into bilateral LE. Physical findings are consistent with referring provider impression although testing is complicated by acute R achilles injury and pt being on a knee scooter. She demonstrates significant decrease in proximal hip strength and overall functional mobility. MMT testing also most likely inaccurate as pt is very fearful of movement overall. We spent a lot of time in eval discussing diagnosis of fibromyalgia and that aerobic and light resistance exercise is good for this condition as she is very fearful overall. She will see Dr. Jerri next week to discuss recent achilles injury, she would benefit from aquatic therapy if cleared to reduce joint load and work on aerobic capacity.   OBJECTIVE IMPAIRMENTS: Abnormal gait, decreased activity tolerance, decreased balance, decreased endurance, decreased mobility, difficulty walking, decreased ROM, decreased strength, impaired sensation, postural dysfunction, and pain   ACTIVITY LIMITATIONS: carrying, lifting, sitting, standing, squatting, sleeping, stairs, transfers, dressing, hygiene/grooming, and locomotion  level  PARTICIPATION LIMITATIONS: meal prep, cleaning, driving, shopping, community activity, and occupation  PERSONAL FACTORS: Fitness, Time since onset of injury/illness/exacerbation, and 3+ comorbidities: HTN, DM II, Fibromyalgia are also affecting patient's functional outcome.   REHAB POTENTIAL: Fair - complexity of co-morbidities  CLINICAL DECISION MAKING: Unstable/unpredictable  EVALUATION COMPLEXITY: High   GOALS: Goals reviewed with patient? No  SHORT TERM GOALS: Target date: 10/21/2024   Pt will be compliant and knowledgeable with initial HEP for improved comfort and carryover Baseline: initial HEP given  Goal status: INITIAL  2.  Pt will self report low back and LE pain no greater than 8/10 for improved comfort and functional ability Baseline: 10/10 at worst Goal status: INITIAL   LONG TERM GOALS: Target date: 11/25/2024   Pt will improve average PSFS score to at least 5.3 for improved self perceived functional ability and decreased deficits Baseline: 3.33 Goal status: INITIAL  2.  Pt will self report low back and B LE pain no greater than 6/10 for improved comfort and functional ability Baseline: 10/10 at worst Goal status: INITIAL   3.  Pt will increase 30 Second Sit to Stand rep count by less than 2 reps for improved balance, strength, and functional mobility Baseline: will assess if cleared for weight bearing Goal status: INITIAL   4.  Pt will improve LE MMT to at least 3+/5 for improved functional mobility and decrease pain Baseline: see chart Goal status: INITIAL   PLAN:  PT FREQUENCY: 1-2x/week  PT DURATION: 8 weeks  PLANNED INTERVENTIONS: 97164- PT Re-evaluation, 97110-Therapeutic exercises,  02469- Therapeutic activity, [id number]- Neuromuscular re-education, [id number]- Self Care, 02859- Manual therapy, [id number]- Gait training, [phone]- Aquatic Therapy, [phone]- Electrical stimulation (unattended), [id number]- Electrical stimulation (manual), [id number]- Vasopneumatic  device, [id number] (1-2 muscles), 20561 (3+ muscles)- Dry Needling, and Patient/Family education.  PLAN FOR NEXT SESSION: assess HEP response, review ortho MD visit, core/LE strengthening, aquatic therapy?   Alm JAYSON Kingdom, PT 10/01/2024, 8:29 AM

## 2024-09-29 ENCOUNTER — Ambulatory Visit: Attending: Physical Medicine and Rehabilitation

## 2024-09-29 ENCOUNTER — Other Ambulatory Visit: Payer: Self-pay

## 2024-09-29 DIAGNOSIS — M79604 Pain in right leg: Secondary | ICD-10-CM | POA: Insufficient documentation

## 2024-09-29 DIAGNOSIS — R2689 Other abnormalities of gait and mobility: Secondary | ICD-10-CM | POA: Insufficient documentation

## 2024-09-29 DIAGNOSIS — G8929 Other chronic pain: Secondary | ICD-10-CM | POA: Insufficient documentation

## 2024-09-29 DIAGNOSIS — M6281 Muscle weakness (generalized): Secondary | ICD-10-CM | POA: Diagnosis present

## 2024-09-29 DIAGNOSIS — M7918 Myalgia, other site: Secondary | ICD-10-CM | POA: Insufficient documentation

## 2024-09-29 DIAGNOSIS — M79605 Pain in left leg: Secondary | ICD-10-CM | POA: Insufficient documentation

## 2024-09-29 DIAGNOSIS — M5459 Other low back pain: Secondary | ICD-10-CM | POA: Insufficient documentation

## 2024-09-29 DIAGNOSIS — M5442 Lumbago with sciatica, left side: Secondary | ICD-10-CM | POA: Diagnosis not present

## 2024-09-29 DIAGNOSIS — M5441 Lumbago with sciatica, right side: Secondary | ICD-10-CM | POA: Insufficient documentation

## 2024-09-29 DIAGNOSIS — M797 Fibromyalgia: Secondary | ICD-10-CM | POA: Insufficient documentation

## 2024-09-30 ENCOUNTER — Other Ambulatory Visit: Payer: Self-pay

## 2024-10-01 ENCOUNTER — Other Ambulatory Visit: Payer: Self-pay

## 2024-10-01 ENCOUNTER — Other Ambulatory Visit: Payer: Self-pay | Admitting: Nurse Practitioner

## 2024-10-01 DIAGNOSIS — E1142 Type 2 diabetes mellitus with diabetic polyneuropathy: Secondary | ICD-10-CM

## 2024-10-01 MED ORDER — TRULICITY 0.75 MG/0.5ML ~~LOC~~ SOAJ
0.7500 mg | SUBCUTANEOUS | 1 refills | Status: DC
  Filled 2024-10-01: qty 2, 28d supply, fill #0
  Filled 2024-10-26: qty 2, 28d supply, fill #1

## 2024-10-01 NOTE — Addendum Note (Signed)
 Addended by: JOHNA, Anmol Paschen C on: 10/01/2024 08:30 AM   Modules accepted: Orders

## 2024-10-02 ENCOUNTER — Other Ambulatory Visit: Payer: Self-pay

## 2024-10-06 ENCOUNTER — Other Ambulatory Visit: Payer: Self-pay

## 2024-10-06 ENCOUNTER — Ambulatory Visit: Admitting: Orthopaedic Surgery

## 2024-10-06 DIAGNOSIS — M25571 Pain in right ankle and joints of right foot: Secondary | ICD-10-CM | POA: Diagnosis not present

## 2024-10-06 NOTE — Progress Notes (Signed)
 Office Visit Note   Patient: Wendy Olson           Date of Birth: [birth year]           MRN: [id number] Visit Date: 10/06/2024              Requested by: Theotis Haze ORN, NP [address] [place],  [place] [address] PCP: Theotis Haze ORN, NP   Assessment & Plan: Visit Diagnoses:  1. Pain in right ankle and joints of right foot     Plan: History of Present Illness Wendy Olson is a 48 year old female who presents with right ankle pain following an injury.  On October 27th, she sustained an injury to her right ankle while stepping down, accompanied by a 'big pop' and immediate pain. Post-injury, she was unable to bear weight on the right ankle and observed bruising on the lateral aspect. X-rays at the emergency room were normal. She currently uses a walking boot, which provides some relief, but experiences pain described as a 'tear' or 'pull' when attempting to push off with her foot. She is using crutches for mobility.  Physical Exam MUSCULOSKELETAL: Tenderness along the right Achilles tendon. Inability to push off with right foot due to pain.  No bruising or significant swelling.  Results RADIOLOGY Right ankle X-ray: Normal (09/21/2024)  Assessment and Plan Right Achilles tendon strain versus tear Acute strain with low suspicion for rupture. MRI needed to rule out significant injury. - Ordered MRI of the right Achilles tendon. - Referred to sports medicine for ultrasound evaluation. - Advised use of walking boot and crutches. - Instructed to avoid weight-bearing activities.  Follow-Up Instructions: No follow-ups on file.   Orders:  Orders Placed This Encounter  Procedures   MR Ankle Right w/o contrast   No orders of the defined types were placed in this encounter.     Procedures: No procedures performed   Clinical Data: No additional findings.   Subjective: Chief Complaint  Patient presents with   Right Ankle - Pain    DOI 09/21/2024     HPI  Review of Systems  Constitutional: Negative.   HENT: Negative.    Eyes: Negative.   Respiratory: Negative.    Cardiovascular: Negative.   Endocrine: Negative.   Musculoskeletal: Negative.   Neurological: Negative.   Hematological: Negative.   Psychiatric/Behavioral: Negative.    All other systems reviewed and are negative.    Objective: Vital Signs: There were no vitals taken for this visit.  Physical Exam Vitals and nursing note reviewed.  Constitutional:      Appearance: She is well-developed.  HENT:     Head: Atraumatic.     Nose: Nose normal.  Eyes:     Extraocular Movements: Extraocular movements intact.  Cardiovascular:     Pulses: Normal pulses.  Pulmonary:     Effort: Pulmonary effort is normal.  Abdominal:     Palpations: Abdomen is soft.  Musculoskeletal:     Cervical back: Neck supple.  Skin:    General: Skin is warm.     Capillary Refill: Capillary refill takes less than 2 seconds.  Neurological:     Mental Status: She is alert. Mental status is at baseline.  Psychiatric:        Behavior: Behavior normal.        Thought Content: Thought content normal.        Judgment: Judgment normal.     Ortho Exam  Specialty Comments:  CLINICAL DATA:  Low back pain, cauda equina syndrome suspected low back pain, left leg weakness.   EXAM: MRI LUMBAR SPINE WITHOUT CONTRAST   TECHNIQUE: Multiplanar, multisequence MR imaging of the lumbar spine was performed. No intravenous contrast was administered.   COMPARISON:  None Available.   FINDINGS: Segmentation:  Standard.   Alignment:  Physiologic.   Vertebrae:  No fracture, evidence of discitis, or bone lesion.   Conus medullaris and cauda equina: Conus extends to the L2 level. Conus and cauda equina appear normal.   Paraspinal and other soft tissues: Negative.   Disc levels:   T12-L1: Unremarkable   L1-L2: Unremarkable   L2-L3: Unremarkable   L3-L4: Unremarkable   L4-L5: Mild  bilateral facet degenerative change. No spinal canal narrowing. No neural foraminal narrowing. No significant disc bulge.   L5-S1: Mild bilateral facet degenerative change. No significant disc bulge. No spinal narrowing. Mild bilateral neural foraminal narrowing.   IMPRESSION: 1. No acute abnormality of the lumbar spine. 2. Mild bilateral neural foraminal narrowing at L5-S1. No spinal canal narrowing.     Electronically Signed   By: Lyndall Gore M.D.   On: 09/13/2023 11:59  Imaging: No results found.   PMFS History: Patient Active Problem List   Diagnosis Date Noted   Candidiasis of female genitalia 04/21/2024   Hypophosphatemia 04/21/2024   Acute pyelonephritis 04/20/2024   Essential hypertension 04/20/2024   Hyperlipidemia 04/20/2024   GERD (gastroesophageal reflux disease) 04/20/2024   Hematuria 04/20/2024   Dehydration 04/20/2024   Hyponatremia 04/20/2024   Acidosis, metabolic 04/20/2024   Constipation 04/20/2024   Type 2 diabetes mellitus with diabetic polyneuropathy, with long-term current use of insulin  (HCC) 10/28/2023   Sore throat 12/30/2016   Hypertension 12/14/2015   Type 2 diabetes mellitus with hyperglycemia, without long-term current use of insulin  (HCC) 11/01/2015   Past Medical History:  Diagnosis Date   Allergy    seasonal   Allergy to lobster    Anxiety    Asthma    Diabetes mellitus without complication (HCC)    type 2   Hyperlipidemia    on meds   Hypertension    on meds   Seasonal allergies     Family History  Problem Relation Age of Onset   Diabetes Mother    Hypertension Mother    Dementia Mother    Pancreatic cancer Father    Diabetes Father    Hypertension Father    Colon cancer Neg Hx    Colon polyps Neg Hx    Esophageal cancer Neg Hx    Rectal cancer Neg Hx    Stomach cancer Neg Hx    Breast cancer Neg Hx     Past Surgical History:  Procedure Laterality Date   TUBAL LIGATION  2009   Social History   Occupational  History   Not on file  Tobacco Use   Smoking status: Former    Current packs/day: 0.00    Types: Cigarettes    Quit date: 01/25/2007    Years since quitting: 17.7   Smokeless tobacco: Never  Vaping Use   Vaping status: Never Used  Substance and Sexual Activity   Alcohol use: No   Drug use: No   Sexual activity: Yes    Birth control/protection: Surgical    Comment: tubal ligation

## 2024-10-07 ENCOUNTER — Other Ambulatory Visit: Payer: Self-pay

## 2024-10-07 ENCOUNTER — Encounter: Payer: Self-pay | Admitting: Orthopaedic Surgery

## 2024-10-08 ENCOUNTER — Other Ambulatory Visit: Payer: Self-pay

## 2024-10-11 ENCOUNTER — Other Ambulatory Visit: Payer: Self-pay | Admitting: Nurse Practitioner

## 2024-10-11 DIAGNOSIS — Z794 Long term (current) use of insulin: Secondary | ICD-10-CM

## 2024-10-12 ENCOUNTER — Other Ambulatory Visit: Payer: Self-pay

## 2024-10-12 MED ORDER — GABAPENTIN 300 MG PO CAPS
300.0000 mg | ORAL_CAPSULE | Freq: Three times a day (TID) | ORAL | 3 refills | Status: AC
  Filled 2024-10-12 – 2024-10-25 (×2): qty 90, 30d supply, fill #0
  Filled 2024-11-23: qty 90, 30d supply, fill #1
  Filled 2024-12-24: qty 90, 30d supply, fill #2

## 2024-10-13 ENCOUNTER — Ambulatory Visit

## 2024-10-13 ENCOUNTER — Telehealth: Payer: Self-pay

## 2024-10-13 NOTE — Telephone Encounter (Signed)
 Wendy Olson  PT, DPT

## 2024-10-14 ENCOUNTER — Ambulatory Visit: Admitting: Nurse Practitioner

## 2024-10-19 ENCOUNTER — Ambulatory Visit

## 2024-10-19 ENCOUNTER — Telehealth: Payer: Self-pay | Admitting: Orthopaedic Surgery

## 2024-10-19 NOTE — Telephone Encounter (Signed)
 Patient called and said that the MRI could not be scheduled because her medicaid denied it. CB#[phone]

## 2024-10-20 ENCOUNTER — Ambulatory Visit

## 2024-10-20 ENCOUNTER — Other Ambulatory Visit: Payer: Self-pay

## 2024-10-20 NOTE — Therapy (Incomplete)
 OUTPATIENT PHYSICAL THERAPY THORACOLUMBAR EVALUATION   Patient Name: Wendy Olson MRN: [id number] DOB:06-04-1976, 47 y.o., female Today's Date: 10/20/2024  END OF SESSION:    Past Medical History:  Diagnosis Date   Allergy    seasonal   Allergy to lobster    Anxiety    Asthma    Diabetes mellitus without complication (HCC)    type 2   Hyperlipidemia    on meds   Hypertension    on meds   Seasonal allergies    Past Surgical History:  Procedure Laterality Date   TUBAL LIGATION  2009   Patient Active Problem List   Diagnosis Date Noted   Candidiasis of female genitalia 04/21/2024   Hypophosphatemia 04/21/2024   Acute pyelonephritis 04/20/2024   Essential hypertension 04/20/2024   Hyperlipidemia 04/20/2024   GERD (gastroesophageal reflux disease) 04/20/2024   Hematuria 04/20/2024   Dehydration 04/20/2024   Hyponatremia 04/20/2024   Acidosis, metabolic 04/20/2024   Constipation 04/20/2024   Type 2 diabetes mellitus with diabetic polyneuropathy, with long-term current use of insulin  (HCC) 10/28/2023   Sore throat 12/30/2016   Hypertension 12/14/2015   Type 2 diabetes mellitus with hyperglycemia, without long-term current use of insulin  (HCC) 11/01/2015    PCP: Theotis Haze ORN, NP  REFERRING PROVIDER: Trudy Duwaine BRAVO, NP  REFERRING DIAG:  M54.42,M54.41,G89.29 (ICD-10-CM) - Chronic bilateral low back pain with bilateral sciatica M79.18 (ICD-10-CM) - Myofascial pain syndrome M79.7 (ICD-10-CM) - Fibromyalgia  Rationale for Evaluation and Treatment: Rehabilitation  THERAPY DIAG:  No diagnosis found.  ONSET DATE: Chronic - recent exacerbation   SUBJECTIVE:                                                                                                                                                                                           SUBJECTIVE STATEMENT: Pt presents to PT with spouse, reports chronic hx of LBP with referral of pain and N/T down  bilateral posterior LE. Pain is very severe and interferes with many activities. She has dx of fibromyalgia and is very fearful of exercise and movement overall. Unfortunately today's evaluation and subjective was impacted by recent R achilles injury, she was told at MD to be NWB until she sees othro on 10/06/24.   PERTINENT HISTORY:  HTN, DM II, Fibromyalgia  PAIN:  Are you having pain?  Yes: NPRS scale: 9/10 Worst: 10/10 Pain location: lower back, B LE Pain description: sharp, sore, N/T Aggravating factors: prolonged standing/sitting, walking, lifting Relieving factors: heat, rest, position   PRECAUTIONS: Fall  RED FLAGS: None   WEIGHT BEARING RESTRICTIONS: No  FALLS:  Has patient fallen in last 6  months? Yes. Number of falls - one fall leading to ED visit  LIVING ENVIRONMENT: Lives with: lives with their spouse Lives in: Other - Hotel Stairs: Yes: External: 17 steps; can reach both Has following equipment at home: Crutches and knee scooter, CAM boot  OCCUPATION: Not working  PLOF: Independent  PATIENT GOALS: decrease pain, be able to get around and walk better  NEXT MD VISIT: 10/06/2024  OBJECTIVE:  Note: Objective measures were completed at Evaluation unless otherwise noted.  DIAGNOSTIC FINDINGS:  See imaging   PATIENT SURVEYS:  PSFS: THE PATIENT SPECIFIC FUNCTIONAL SCALE  Place score of 0-10 (0 = unable to perform activity and 10 = able to perform activity at the same level as before injury or problem)  Activity Date: 09/30/2024    Sleep  4    2. Walking 2    3. Bending over/lifting 4    4.      Total Score 3.33      Total Score = Sum of activity scores/number of activities  Minimally Detectable Change: 3 points (for single activity); 2 points (for average score)  [facility] (nd). The Patient Specific Functional Scale . Retrieved from [email or web address]   COGNITION: Overall cognitive  status: Within functional limits for tasks assessed     SENSATION: Light touch: Impaired - bilateral LE N/T  POSTURE: rounded shoulders, forward head, increased lumbar lordosis, and fx boot on R LE  PALPATION: Significant TTP to bilateral lumbar paraspinals, bilateral gluteals  LUMBAR ROM:   AROM eval  Flexion 25%  Extension 0%  Right lateral flexion   Left lateral flexion   Right rotation 25%  Left rotation 25%   (Blank rows = not tested)  LOWER EXTREMITY MMT:    MMT Right eval Left eval  Hip flexion 2+ 2+  Hip extension    Hip abduction 2+ 2+  Hip adduction    Hip internal rotation    Hip external rotation    Knee flexion 2+ 2+  Knee extension 2+ 2+  Ankle dorsiflexion    Ankle plantarflexion    Ankle inversion    Ankle eversion     (Blank rows = not tested)  LUMBAR SPECIAL TESTS:  Slump test: Positive  FUNCTIONAL TESTS:  Could not assess due to R LE in boot and told to be NWB  GAIT: Distance walked: 67ft Assistive device utilized: knee scooter and CAM boot on R foot Level of assistance: Min A Comments: unsteady gait using knee scooter - PT assist with scooter navigation and sequencing while ambulating to treatment room  TREATMENT: [facility] Adult PT Treatment:                                                DATE: 09/29/2024 Therapeutic Exercise: Seated bilateral ER x 5 RTB Seated ball squeeze x 5  Seated clamshell x 5 YTB  PATIENT EDUCATION:  Education details: eval findings, PSFS, Fibromyalgia and exercise, HEP, POC Person educated: Patient and Spouse Education method: Explanation, Demonstration, and Handouts Education comprehension: verbalized understanding and returned demonstration  HOME EXERCISE PROGRAM: Access Code: [email or web address] URL: [email or web address] Date: 09/29/2024 Prepared by: Alm Kingdom  Exercises - Shoulder External Rotation and Scapular Retraction with Resistance  - 1 x daily - 7 x weekly - 2-3 sets - 10 reps - yellow  band hold - Seated Hip Adduction Squeeze with [place]  -  1 x daily - 7 x weekly - 2-3 sets - 10 reps - 3 sec hold - Seated Hip Abduction with Resistance  - 1 x daily - 7 x weekly - 3 sets - 10 reps - yellow band hold  ASSESSMENT:  CLINICAL IMPRESSION: Patient is a 48 y.o. F who was seen today for physical therapy evaluation and treatment for chronic LBP with referral into bilateral LE. Physical findings are consistent with referring provider impression although testing is complicated by acute R achilles injury and pt being on a knee scooter. She demonstrates significant decrease in proximal hip strength and overall functional mobility. MMT testing also most likely inaccurate as pt is very fearful of movement overall. We spent a lot of time in eval discussing diagnosis of fibromyalgia and that aerobic and light resistance exercise is good for this condition as she is very fearful overall. She will see Dr. Jerri next week to discuss recent achilles injury, she would benefit from aquatic therapy if cleared to reduce joint load and work on aerobic capacity.   OBJECTIVE IMPAIRMENTS: Abnormal gait, decreased activity tolerance, decreased balance, decreased endurance, decreased mobility, difficulty walking, decreased ROM, decreased strength, impaired sensation, postural dysfunction, and pain   ACTIVITY LIMITATIONS: carrying, lifting, sitting, standing, squatting, sleeping, stairs, transfers, dressing, hygiene/grooming, and locomotion level  PARTICIPATION LIMITATIONS: meal prep, cleaning, driving, shopping, community activity, and occupation  PERSONAL FACTORS: Fitness, Time since onset of injury/illness/exacerbation, and 3+ comorbidities: HTN, DM II, Fibromyalgia are also affecting patient's functional outcome.   REHAB POTENTIAL: Fair - complexity of co-morbidities  CLINICAL DECISION MAKING: Unstable/unpredictable  EVALUATION COMPLEXITY: High   GOALS: Goals reviewed with patient? No  SHORT TERM GOALS:  Target date: 10/21/2024   Pt will be compliant and knowledgeable with initial HEP for improved comfort and carryover Baseline: initial HEP given  Goal status: INITIAL  2.  Pt will self report low back and LE pain no greater than 8/10 for improved comfort and functional ability Baseline: 10/10 at worst Goal status: INITIAL   LONG TERM GOALS: Target date: 11/25/2024   Pt will improve average PSFS score to at least 5.3 for improved self perceived functional ability and decreased deficits Baseline: 3.33 Goal status: INITIAL  2.  Pt will self report low back and B LE pain no greater than 6/10 for improved comfort and functional ability Baseline: 10/10 at worst Goal status: INITIAL   3.  Pt will increase 30 Second Sit to Stand rep count by less than 2 reps for improved balance, strength, and functional mobility Baseline: will assess if cleared for weight bearing Goal status: INITIAL   4.  Pt will improve LE MMT to at least 3+/5 for improved functional mobility and decrease pain Baseline: see chart Goal status: INITIAL   PLAN:  PT FREQUENCY: 1-2x/week  PT DURATION: 8 weeks  PLANNED INTERVENTIONS: 97164- PT Re-evaluation, 97110-Therapeutic exercises, 97530- Therapeutic activity, [id number]- Neuromuscular re-education, 97535- Self Care, 02859- Manual therapy, [id number]- Gait training, [phone]- Aquatic Therapy, [phone]- Electrical stimulation (unattended), [id number]- Electrical stimulation (manual), [id number]- Vasopneumatic device, [id number] (1-2 muscles), [id number] (3+ muscles)- Dry Needling, and Patient/Family education.  PLAN FOR NEXT SESSION: assess HEP response, review ortho MD visit, core/LE strengthening, aquatic therapy?   Alm JAYSON Kingdom, PT 10/20/2024, 8:00 AM

## 2024-10-21 ENCOUNTER — Other Ambulatory Visit: Payer: Self-pay

## 2024-10-23 ENCOUNTER — Other Ambulatory Visit: Payer: Self-pay

## 2024-10-25 ENCOUNTER — Other Ambulatory Visit: Payer: Self-pay

## 2024-10-26 ENCOUNTER — Other Ambulatory Visit: Payer: Self-pay

## 2024-10-26 ENCOUNTER — Ambulatory Visit

## 2024-10-26 DIAGNOSIS — M6281 Muscle weakness (generalized): Secondary | ICD-10-CM | POA: Diagnosis present

## 2024-10-26 DIAGNOSIS — M79605 Pain in left leg: Secondary | ICD-10-CM | POA: Insufficient documentation

## 2024-10-26 DIAGNOSIS — M79604 Pain in right leg: Secondary | ICD-10-CM | POA: Insufficient documentation

## 2024-10-26 DIAGNOSIS — R2689 Other abnormalities of gait and mobility: Secondary | ICD-10-CM | POA: Diagnosis present

## 2024-10-26 DIAGNOSIS — M5459 Other low back pain: Secondary | ICD-10-CM | POA: Diagnosis present

## 2024-10-26 NOTE — Therapy (Signed)
 OUTPATIENT PHYSICAL THERAPY TREATMENT   Patient Name: Wendy Olson MRN: [id number] DOB:August 01, 1976, 48 y.o., female Today's Date: 10/26/2024  END OF SESSION:  PT End of Session - 10/26/24 1455     Visit Number 2    Number of Visits 17    Date for Recertification  11/25/24    Authorization Type [employer]    PT Start Time 1510    Activity Tolerance Patient limited by pain;Patient limited by fatigue    Behavior During Therapy Anxious           Past Medical History:  Diagnosis Date   Allergy    seasonal   Allergy to lobster    Anxiety    Asthma    Diabetes mellitus without complication (HCC)    type 2   Hyperlipidemia    on meds   Hypertension    on meds   Seasonal allergies    Past Surgical History:  Procedure Laterality Date   TUBAL LIGATION  2009   Patient Active Problem List   Diagnosis Date Noted   Candidiasis of female genitalia 04/21/2024   Hypophosphatemia 04/21/2024   Acute pyelonephritis 04/20/2024   Essential hypertension 04/20/2024   Hyperlipidemia 04/20/2024   GERD (gastroesophageal reflux disease) 04/20/2024   Hematuria 04/20/2024   Dehydration 04/20/2024   Hyponatremia 04/20/2024   Acidosis, metabolic 04/20/2024   Constipation 04/20/2024   Type 2 diabetes mellitus with diabetic polyneuropathy, with long-term current use of insulin  (HCC) 10/28/2023   Sore throat 12/30/2016   Hypertension 12/14/2015   Type 2 diabetes mellitus with hyperglycemia, without long-term current use of insulin  (HCC) 11/01/2015    PCP: Theotis Haze ORN, NP  REFERRING PROVIDER: Trudy Duwaine BRAVO, NP  REFERRING DIAG:  M54.42,M54.41,G89.29 (ICD-10-CM) - Chronic bilateral low back pain with bilateral sciatica M79.18 (ICD-10-CM) - Myofascial pain syndrome M79.7 (ICD-10-CM) - Fibromyalgia  Rationale for Evaluation and Treatment: Rehabilitation  THERAPY DIAG:  Other low back pain  Pain in left leg  Pain in right leg  Other abnormalities of gait and  mobility  Muscle weakness (generalized)  ONSET DATE: Chronic - recent exacerbation   SUBJECTIVE:                                                                                                                                                                                           SUBJECTIVE STATEMENT: Pt presents to PT with reports of continued 9/10 LBP. She is walking in boot today, ortho believes her achilles was strained but not completely torn.   EVAL: Pt presents to PT with spouse, reports chronic hx of LBP with referral of pain and N/T  down bilateral posterior LE. Pain is very severe and interferes with many activities. She has dx of fibromyalgia and is very fearful of exercise and movement overall. Unfortunately today's evaluation and subjective was impacted by recent R achilles injury, she was told at MD to be NWB until she sees othro on 10/06/24.   PERTINENT HISTORY:  HTN, DM II, Fibromyalgia  PAIN:  Are you having pain?  Yes: NPRS scale: 9/10 Worst: 10/10 Pain location: lower back, B LE Pain description: sharp, sore, N/T Aggravating factors: prolonged standing/sitting, walking, lifting Relieving factors: heat, rest, position   PRECAUTIONS: Fall  RED FLAGS: None   WEIGHT BEARING RESTRICTIONS: No  FALLS:  Has patient fallen in last 6 months? Yes. Number of falls - one fall leading to ED visit  LIVING ENVIRONMENT: Lives with: lives with their spouse Lives in: Other - Hotel Stairs: Yes: External: 17 steps; can reach both Has following equipment at home: Crutches and knee scooter, CAM boot  OCCUPATION: Not working  PLOF: Independent  PATIENT GOALS: decrease pain, be able to get around and walk better  NEXT MD VISIT: 10/06/2024  OBJECTIVE:  Note: Objective measures were completed at Evaluation unless otherwise noted.  DIAGNOSTIC FINDINGS:  See imaging   PATIENT SURVEYS:  PSFS: THE PATIENT SPECIFIC FUNCTIONAL SCALE  Place score of 0-10 (0 = unable to  perform activity and 10 = able to perform activity at the same level as before injury or problem)  Activity Date: 09/30/2024    Sleep  4    2. Walking 2    3. Bending over/lifting 4    4.      Total Score 3.33      Total Score = Sum of activity scores/number of activities  Minimally Detectable Change: 3 points (for single activity); 2 points (for average score)  [facility] (nd). The Patient Specific Functional Scale . Retrieved from [email or web address]   COGNITION: Overall cognitive status: Within functional limits for tasks assessed     SENSATION: Light touch: Impaired - bilateral LE N/T  POSTURE: rounded shoulders, forward head, increased lumbar lordosis, and fx boot on R LE  PALPATION: Significant TTP to bilateral lumbar paraspinals, bilateral gluteals  LUMBAR ROM:   AROM eval  Flexion 25%  Extension 0%  Right lateral flexion   Left lateral flexion   Right rotation 25%  Left rotation 25%   (Blank rows = not tested)  LOWER EXTREMITY MMT:    MMT Right eval Left eval  Hip flexion 2+ 2+  Hip extension    Hip abduction 2+ 2+  Hip adduction    Hip internal rotation    Hip external rotation    Knee flexion 2+ 2+  Knee extension 2+ 2+  Ankle dorsiflexion    Ankle plantarflexion    Ankle inversion    Ankle eversion     (Blank rows = not tested)  LUMBAR SPECIAL TESTS:  Slump test: Positive  FUNCTIONAL TESTS:  Could not assess due to R LE in boot and told to be NWB  GAIT: Distance walked: 27ft Assistive device utilized: knee scooter and CAM boot on R foot Level of assistance: Min A Comments: unsteady gait using knee scooter - PT assist with scooter navigation and sequencing while ambulating to treatment room  TREATMENT: [facility] Adult PT Treatment:  DATE: 10/26/2024 Supine SLR 2x5 each Hooklying ball squeeze 2x10 - 5 hold Hooklying clamshell 2x15  GTB Bridge 2x10 Seated bilateral ER 2x10 YTB Seated horizontal abd 2x10 YTB Standing row 2x10 GTB Standing ext 2x10 GTB  [facility] Adult PT Treatment:                                                DATE: 09/29/2024 Therapeutic Exercise: Seated bilateral ER x 5 RTB Seated ball squeeze x 5  Seated clamshell x 5 YTB  PATIENT EDUCATION:  Education details: eval findings, PSFS, Fibromyalgia and exercise, HEP, POC Person educated: Patient and Spouse Education method: Explanation, Demonstration, and Handouts Education comprehension: verbalized understanding and returned demonstration  HOME EXERCISE PROGRAM: Access Code: [email or web address] URL: [email or web address] Date: 10/26/2024 Prepared by: Alm Kingdom  Exercises - Shoulder External Rotation and Scapular Retraction with Resistance  - 1 x daily - 7 x weekly - 2-3 sets - 10 reps - yellow band hold - Seated Hip Adduction Squeeze with Ball  - 1 x daily - 7 x weekly - 2-3 sets - 10 reps - 3 sec hold - Seated Hip Abduction with Resistance  - 1 x daily - 7 x weekly - 3 sets - 10 reps - yellow band hold - Supine Bridge  - 1 x daily - 7 x weekly - 2 sets - 5 reps - Active Straight Leg Raise with Quad Set  - 1 x daily - 7 x weekly - 3 sets - 10 reps - Standing Shoulder Row with Anchored Resistance  - 1 x daily - 7 x weekly - 3 sets - 10 reps - green band hold  ASSESSMENT:  CLINICAL IMPRESSION: Pt was able to complete all prescribed exercises with improved tolerance and function. Today we focused on LE and core strengthening as well as shoulder and periscapular strengthening to improve function and decrease chronic pain. HEP updated for progression of activity. Continues to benefit from skilled PT, will progress as tolerated.   EVAL: Patient is a 48 y.o. F who was seen today for physical therapy evaluation and treatment for chronic LBP with referral into bilateral LE. Physical findings are consistent with referring provider impression although  testing is complicated by acute R achilles injury and pt being on a knee scooter. She demonstrates significant decrease in proximal hip strength and overall functional mobility. MMT testing also most likely inaccurate as pt is very fearful of movement overall. We spent a lot of time in eval discussing diagnosis of fibromyalgia and that aerobic and light resistance exercise is good for this condition as she is very fearful overall. She will see Dr. Jerri next week to discuss recent achilles injury, she would benefit from aquatic therapy if cleared to reduce joint load and work on aerobic capacity.   OBJECTIVE IMPAIRMENTS: Abnormal gait, decreased activity tolerance, decreased balance, decreased endurance, decreased mobility, difficulty walking, decreased ROM, decreased strength, impaired sensation, postural dysfunction, and pain   ACTIVITY LIMITATIONS: carrying, lifting, sitting, standing, squatting, sleeping, stairs, transfers, dressing, hygiene/grooming, and locomotion level  PARTICIPATION LIMITATIONS: meal prep, cleaning, driving, shopping, community activity, and occupation  PERSONAL FACTORS: Fitness, Time since onset of injury/illness/exacerbation, and 3+ comorbidities: HTN, DM II, Fibromyalgia are also affecting patient's functional outcome.   REHAB POTENTIAL: Fair - complexity of co-morbidities  CLINICAL DECISION MAKING: Unstable/unpredictable  EVALUATION COMPLEXITY: High   GOALS: Goals reviewed  with patient? No  SHORT TERM GOALS: Target date: 10/21/2024   Pt will be compliant and knowledgeable with initial HEP for improved comfort and carryover Baseline: initial HEP given  Goal status: INITIAL  2.  Pt will self report low back and LE pain no greater than 8/10 for improved comfort and functional ability Baseline: 10/10 at worst Goal status: INITIAL   LONG TERM GOALS: Target date: 11/25/2024   Pt will improve average PSFS score to at least 5.3 for improved self perceived functional  ability and decreased deficits Baseline: 3.33 Goal status: INITIAL  2.  Pt will self report low back and B LE pain no greater than 6/10 for improved comfort and functional ability Baseline: 10/10 at worst Goal status: INITIAL   3.  Pt will increase 30 Second Sit to Stand rep count by less than 2 reps for improved balance, strength, and functional mobility Baseline: will assess if cleared for weight bearing Goal status: INITIAL   4.  Pt will improve LE MMT to at least 3+/5 for improved functional mobility and decrease pain Baseline: see chart Goal status: INITIAL   PLAN:  PT FREQUENCY: 1-2x/week  PT DURATION: 8 weeks  PLANNED INTERVENTIONS: 97164- PT Re-evaluation, 97110-Therapeutic exercises, 97530- Therapeutic activity, [id number]- Neuromuscular re-education, 97535- Self Care, 02859- Manual therapy, [id number]- Gait training, [phone]- Aquatic Therapy, [phone]- Electrical stimulation (unattended), [id number]- Electrical stimulation (manual), [id number]- Vasopneumatic device, [id number] (1-2 muscles), 20561 (3+ muscles)- Dry Needling, and Patient/Family education.  PLAN FOR NEXT SESSION: assess HEP response, review ortho MD visit, core/LE strengthening, aquatic therapy?   Alm JAYSON Kingdom, PT 10/26/2024, 2:56 PM

## 2024-10-27 ENCOUNTER — Other Ambulatory Visit: Payer: Self-pay

## 2024-10-27 ENCOUNTER — Ambulatory Visit: Admitting: Physical Therapy

## 2024-10-28 ENCOUNTER — Inpatient Hospital Stay
Admission: RE | Admit: 2024-10-28 | Discharge: 2024-10-28 | Attending: Orthopaedic Surgery | Admitting: Orthopaedic Surgery

## 2024-10-28 ENCOUNTER — Other Ambulatory Visit: Payer: Self-pay

## 2024-10-28 ENCOUNTER — Encounter: Payer: Self-pay | Admitting: Nurse Practitioner

## 2024-10-28 DIAGNOSIS — M25571 Pain in right ankle and joints of right foot: Secondary | ICD-10-CM

## 2024-10-29 ENCOUNTER — Other Ambulatory Visit: Payer: Self-pay

## 2024-10-29 ENCOUNTER — Other Ambulatory Visit (HOSPITAL_COMMUNITY): Payer: Self-pay

## 2024-10-30 ENCOUNTER — Encounter: Payer: Self-pay | Admitting: Pharmacist

## 2024-10-30 ENCOUNTER — Other Ambulatory Visit: Payer: Self-pay

## 2024-11-02 ENCOUNTER — Ambulatory Visit

## 2024-11-02 ENCOUNTER — Ambulatory Visit: Admitting: Physical Medicine and Rehabilitation

## 2024-11-03 ENCOUNTER — Ambulatory Visit: Admitting: Physical Therapy

## 2024-11-04 ENCOUNTER — Other Ambulatory Visit: Payer: Self-pay

## 2024-11-09 ENCOUNTER — Encounter: Payer: Self-pay | Admitting: Nurse Practitioner

## 2024-11-09 ENCOUNTER — Other Ambulatory Visit: Payer: Self-pay

## 2024-11-09 ENCOUNTER — Other Ambulatory Visit: Payer: Self-pay | Admitting: Nurse Practitioner

## 2024-11-09 MED ORDER — FAMOTIDINE 40 MG PO TABS
40.0000 mg | ORAL_TABLET | Freq: Every day | ORAL | 0 refills | Status: AC
  Filled 2024-11-09 – 2024-11-24 (×2): qty 90, 90d supply, fill #0

## 2024-11-16 ENCOUNTER — Other Ambulatory Visit: Payer: Self-pay

## 2024-11-17 ENCOUNTER — Other Ambulatory Visit: Payer: Self-pay

## 2024-11-18 ENCOUNTER — Other Ambulatory Visit: Payer: Self-pay

## 2024-11-20 ENCOUNTER — Other Ambulatory Visit: Payer: Self-pay

## 2024-11-23 ENCOUNTER — Other Ambulatory Visit: Payer: Self-pay

## 2024-11-24 ENCOUNTER — Other Ambulatory Visit: Payer: Self-pay

## 2024-11-24 ENCOUNTER — Other Ambulatory Visit: Payer: Self-pay | Admitting: Nurse Practitioner

## 2024-11-24 ENCOUNTER — Other Ambulatory Visit (HOSPITAL_COMMUNITY): Payer: Self-pay

## 2024-11-24 ENCOUNTER — Other Ambulatory Visit: Payer: Self-pay | Admitting: Family Medicine

## 2024-11-24 DIAGNOSIS — E119 Type 2 diabetes mellitus without complications: Secondary | ICD-10-CM

## 2024-11-24 DIAGNOSIS — E1142 Type 2 diabetes mellitus with diabetic polyneuropathy: Secondary | ICD-10-CM

## 2024-11-24 MED ORDER — DEXCOM G7 SENSOR MISC
6 refills | Status: AC
  Filled 2024-11-24 – 2024-11-27 (×2): qty 3, 30d supply, fill #0
  Filled 2024-12-08 – 2024-12-21 (×3): qty 3, 30d supply, fill #1

## 2024-11-24 MED ORDER — TRULICITY 0.75 MG/0.5ML ~~LOC~~ SOAJ
0.7500 mg | SUBCUTANEOUS | 1 refills | Status: AC
  Filled 2024-11-24: qty 2, 28d supply, fill #0
  Filled 2024-12-16 – 2024-12-17 (×2): qty 2, 28d supply, fill #1

## 2024-11-27 ENCOUNTER — Other Ambulatory Visit: Payer: Self-pay

## 2024-11-28 ENCOUNTER — Other Ambulatory Visit (HOSPITAL_COMMUNITY): Payer: Self-pay

## 2024-12-04 ENCOUNTER — Ambulatory Visit: Admitting: Gastroenterology

## 2024-12-04 NOTE — Progress Notes (Deleted)
 SABRA

## 2024-12-08 ENCOUNTER — Other Ambulatory Visit: Payer: Self-pay

## 2024-12-08 ENCOUNTER — Ambulatory Visit: Admitting: Orthopaedic Surgery

## 2024-12-08 DIAGNOSIS — M7661 Achilles tendinitis, right leg: Secondary | ICD-10-CM | POA: Insufficient documentation

## 2024-12-08 NOTE — Progress Notes (Signed)
 "  Office Visit Note   Patient: Wendy Olson           Date of Birth: January 24, 1976           MRN: [id number] Visit Date: 12/08/2024              Requested by: Theotis Haze ORN, NP [address] [place],  [place] [address] PCP: Theotis Haze ORN, NP   Assessment & Plan: Visit Diagnoses:  1. Achilles tendinitis, right leg     Plan: History of Present Illness Wendy Olson is a 49 year old female with right Achilles tendinopathy and partial tear who presents for follow-up and review of MRI findings.  She has persistent severe right Achilles pain with some improvement since the initial injury. Symptoms remain localized to the tendon.  She is not using a heel lift in her boot. She previously used acetaminophen  and ibuprofen  but is concerned about their safety. She asks about non-opioid alternatives for pain control and prefers to avoid opioids after prior exposure.  Examination of right ankle shows mild tenderness along the Achilles without any palpable defects.  Normal Thompson's test.  She can actively plantarflex the ankle.  Results Radiology Right ankle MRI: Achilles tendinopathy with partial thickness tear, no complete rupture (Independently interpreted)  Assessment and Plan Achilles tendinopathy with partial tear, right leg Partial tear of the right Achilles tendon with tendinopathy confirmed by MRI. Conservative management indicated with recovery expected up to one year. Opioid analgesics contraindicated at this stage of recovery. - Referred to physical therapy for Achilles tendon and ankle strengthening. - Provided heel lifts to reduce tension on the Achilles tendon. - Scheduled follow-up in two months.  Follow-Up Instructions: Return in about 2 months (around 02/05/2025).   Orders:  Orders Placed This Encounter  Procedures   Ambulatory referral to Physical Therapy   No orders of the defined types were placed in this encounter.     Procedures: No  procedures performed   Clinical Data: No additional findings.   Subjective: Chief Complaint  Patient presents with   Right Ankle - Pain    DOI 09/21/2024    HPI  Review of Systems  Constitutional: Negative.   HENT: Negative.    Eyes: Negative.   Respiratory: Negative.    Cardiovascular: Negative.   Endocrine: Negative.   Musculoskeletal: Negative.   Neurological: Negative.   Hematological: Negative.   Psychiatric/Behavioral: Negative.    All other systems reviewed and are negative.    Objective: Vital Signs: There were no vitals taken for this visit.  Physical Exam Vitals and nursing note reviewed.  Constitutional:      Appearance: She is well-developed.  HENT:     Head: Atraumatic.     Nose: Nose normal.  Eyes:     Extraocular Movements: Extraocular movements intact.  Cardiovascular:     Pulses: Normal pulses.  Pulmonary:     Effort: Pulmonary effort is normal.  Abdominal:     Palpations: Abdomen is soft.  Musculoskeletal:     Cervical back: Neck supple.  Skin:    General: Skin is warm.     Capillary Refill: Capillary refill takes less than 2 seconds.  Neurological:     Mental Status: She is alert. Mental status is at baseline.  Psychiatric:        Behavior: Behavior normal.        Thought Content: Thought content normal.        Judgment: Judgment normal.  Ortho Exam  Specialty Comments:  CLINICAL DATA:  Low back pain, cauda equina syndrome suspected low back pain, left leg weakness.   EXAM: MRI LUMBAR SPINE WITHOUT CONTRAST   TECHNIQUE: Multiplanar, multisequence MR imaging of the lumbar spine was performed. No intravenous contrast was administered.   COMPARISON:  None Available.   FINDINGS: Segmentation:  Standard.   Alignment:  Physiologic.   Vertebrae:  No fracture, evidence of discitis, or bone lesion.   Conus medullaris and cauda equina: Conus extends to the L2 level. Conus and cauda equina appear normal.   Paraspinal  and other soft tissues: Negative.   Disc levels:   T12-L1: Unremarkable   L1-L2: Unremarkable   L2-L3: Unremarkable   L3-L4: Unremarkable   L4-L5: Mild bilateral facet degenerative change. No spinal canal narrowing. No neural foraminal narrowing. No significant disc bulge.   L5-S1: Mild bilateral facet degenerative change. No significant disc bulge. No spinal narrowing. Mild bilateral neural foraminal narrowing.   IMPRESSION: 1. No acute abnormality of the lumbar spine. 2. Mild bilateral neural foraminal narrowing at L5-S1. No spinal canal narrowing.     Electronically Signed   By: Lyndall Gore M.D.   On: 09/13/2023 11:59  Imaging: No results found.   PMFS History: Patient Active Problem List   Diagnosis Date Noted   Achilles tendinitis, right leg 12/08/2024   Candidiasis of female genitalia 04/21/2024   Hypophosphatemia 04/21/2024   Acute pyelonephritis 04/20/2024   Essential hypertension 04/20/2024   Hyperlipidemia 04/20/2024   GERD (gastroesophageal reflux disease) 04/20/2024   Hematuria 04/20/2024   Dehydration 04/20/2024   Hyponatremia 04/20/2024   Acidosis, metabolic 04/20/2024   Constipation 04/20/2024   Type 2 diabetes mellitus with diabetic polyneuropathy, with long-term current use of insulin  (HCC) 10/28/2023   Sore throat 12/30/2016   Hypertension 12/14/2015   Type 2 diabetes mellitus with hyperglycemia, without long-term current use of insulin  (HCC) 11/01/2015   Past Medical History:  Diagnosis Date   Allergy    seasonal   Allergy to lobster    Anxiety    Asthma    Diabetes mellitus without complication (HCC)    type 2   Hyperlipidemia    on meds   Hypertension    on meds   Seasonal allergies     Family History  Problem Relation Age of Onset   Diabetes Mother    Hypertension Mother    Dementia Mother    Pancreatic cancer Father    Diabetes Father    Hypertension Father    Colon cancer Neg Hx    Colon polyps Neg Hx     Esophageal cancer Neg Hx    Rectal cancer Neg Hx    Stomach cancer Neg Hx    Breast cancer Neg Hx     Past Surgical History:  Procedure Laterality Date   TUBAL LIGATION  2009   Social History   Occupational History   Not on file  Tobacco Use   Smoking status: Former    Current packs/day: 0.00    Types: Cigarettes    Quit date: 01/25/2007    Years since quitting: 17.8   Smokeless tobacco: Never  Vaping Use   Vaping status: Never Used  Substance and Sexual Activity   Alcohol use: No   Drug use: No   Sexual activity: Yes    Birth control/protection: Surgical    Comment: tubal ligation        "

## 2024-12-10 ENCOUNTER — Telehealth: Payer: Self-pay | Admitting: Nurse Practitioner

## 2024-12-10 ENCOUNTER — Other Ambulatory Visit: Payer: Self-pay

## 2024-12-10 ENCOUNTER — Encounter: Payer: Self-pay | Admitting: Nurse Practitioner

## 2024-12-10 NOTE — Telephone Encounter (Signed)
 Copied from CRM [id number]. Topic: General - Other >> Dec 10, 2024  3:17 PM   Antwanette L wrote:  Reason for CRM: Brittany from the [facility] is calling because she needs to verify the diagnosis code for a Vitamin D  test performed on 08/10/24. Please follow up Brittany at  [phone]

## 2024-12-11 ENCOUNTER — Other Ambulatory Visit: Payer: Self-pay

## 2024-12-13 ENCOUNTER — Encounter: Payer: Self-pay | Admitting: Nurse Practitioner

## 2024-12-14 ENCOUNTER — Other Ambulatory Visit: Payer: Self-pay

## 2024-12-14 DIAGNOSIS — K089 Disorder of teeth and supporting structures, unspecified: Secondary | ICD-10-CM

## 2024-12-14 NOTE — Telephone Encounter (Signed)
 Noted.

## 2024-12-14 NOTE — Telephone Encounter (Signed)
 Spoke with Zyon at [facility]. New diagnosis given.

## 2024-12-15 ENCOUNTER — Other Ambulatory Visit: Payer: Self-pay

## 2024-12-15 ENCOUNTER — Ambulatory Visit: Attending: Physical Medicine and Rehabilitation | Admitting: Physical Therapy

## 2024-12-15 NOTE — Telephone Encounter (Signed)
 Noted.

## 2024-12-15 NOTE — Therapy (Incomplete)
 " OUTPATIENT PHYSICAL THERAPY LOWER EXTREMITY EVALUATION   Patient Name: Wendy Olson MRN: [id number] DOB:Apr 12, 1976, 49 y.o., female Today's Date: 12/15/2024  END OF SESSION:   Past Medical History:  Diagnosis Date   Allergy    seasonal   Allergy to lobster    Anxiety    Asthma    Diabetes mellitus without complication (HCC)    type 2   Hyperlipidemia    on meds   Hypertension    on meds   Seasonal allergies    Past Surgical History:  Procedure Laterality Date   TUBAL LIGATION  2009   Patient Active Problem List   Diagnosis Date Noted   Achilles tendinitis, right leg 12/08/2024   Candidiasis of female genitalia 04/21/2024   Hypophosphatemia 04/21/2024   Acute pyelonephritis 04/20/2024   Essential hypertension 04/20/2024   Hyperlipidemia 04/20/2024   GERD (gastroesophageal reflux disease) 04/20/2024   Hematuria 04/20/2024   Dehydration 04/20/2024   Hyponatremia 04/20/2024   Acidosis, metabolic 04/20/2024   Constipation 04/20/2024   Type 2 diabetes mellitus with diabetic polyneuropathy, with long-term current use of insulin  (HCC) 10/28/2023   Sore throat 12/30/2016   Hypertension 12/14/2015   Type 2 diabetes mellitus with hyperglycemia, without long-term current use of insulin  (HCC) 11/01/2015    PCP: Haze Servant, NP  REFERRING PROVIDER:  Jerri Kay HERO, MD      REFERRING DIAG:  M76.61 (ICD-10-CM) - Achilles tendinitis, right leg      THERAPY DIAG:  No diagnosis found.  Rationale for Evaluation and Treatment: Rehabilitation  ONSET DATE: ***  SUBJECTIVE:   SUBJECTIVE STATEMENT: ***  PERTINENT HISTORY: From referring provider note 12/08/24:  She has persistent severe right Achilles pain with some improvement since the initial injury. Symptoms remain localized to the tendon.   She is not using a heel lift in her boot. She previously used acetaminophen  and ibuprofen  but is concerned about their safety. She asks about non-opioid alternatives  for pain control and prefers to avoid opioids after prior exposure.   Examination of right ankle shows mild tenderness along the Achilles without any palpable defects.  Normal Thompson's test.  She can actively plantarflex the ankle.   Results Radiology Right ankle MRI: Achilles tendinopathy with partial thickness tear, no complete rupture (Independently interpreted)   Assessment and Plan Achilles tendinopathy with partial tear, right leg Partial tear of the right Achilles tendon with tendinopathy confirmed by MRI. Conservative management indicated with recovery expected up to one year. Opioid analgesics contraindicated at this stage of recovery. - Referred to physical therapy for Achilles tendon and ankle strengthening. - Provided heel lifts to reduce tension on the Achilles tendon. - Scheduled follow-up in two months.   Follow-Up Instructions: Return in about 2 months (around 02/05/2025). PAIN:  Are you having pain? Yes: NPRS scale: *** Pain location: *** Pain description: *** Aggravating factors: *** Relieving factors: ***  PRECAUTIONS: {Therapy precautions:24002}  RED FLAGS: {PT Red Flags:29287}   WEIGHT BEARING RESTRICTIONS: {Yes ***/No:24003}  FALLS:  Has patient fallen in last 6 months? {fallsyesno:27318}  LIVING ENVIRONMENT: Lives with: {[facility] lives with:25569::lives with their family} Lives in: {Lives in:25570} Stairs: {opstairs:27293} Has following equipment at home: {Assistive devices:23999}  OCCUPATION: ***  PLOF: {PLOF:24004}  PATIENT GOALS: ***  NEXT MD VISIT: ***  OBJECTIVE:  Note: Objective measures were completed at Evaluation unless otherwise noted.  DIAGNOSTIC FINDINGS: ***  PATIENT SURVEYS:  {rehab surveys:24030}  COGNITION: Overall cognitive status: {cognition:24006}     SENSATION: {sensation:27233}  EDEMA:  {edema:24020}  POSTURE: {posture:25561}  PALPATION: ***  LOWER EXTREMITY ROM:  Active ROM Right eval Left eval   Hip flexion    Hip extension    Hip abduction    Hip adduction    Hip internal rotation    Hip external rotation    Knee flexion    Knee extension    Ankle dorsiflexion    Ankle plantarflexion    Ankle inversion    Ankle eversion     (Blank rows = not tested)  LOWER EXTREMITY MMT:  MMT Right eval Left eval  Hip flexion    Hip extension    Hip abduction    Hip adduction    Hip internal rotation    Hip external rotation    Knee flexion    Knee extension    Ankle dorsiflexion    Ankle plantarflexion    Ankle inversion    Ankle eversion     (Blank rows = not tested)  LOWER EXTREMITY SPECIAL TESTS:  {LEspecialtests:26242}  FUNCTIONAL TESTS:  {Functional tests:24029}  GAIT: Distance walked: *** Assistive device utilized: {Assistive devices:23999} Level of assistance: {Levels of assistance:24026} Comments: ***                                                                                                                                TREATMENT DATE:   12/15/24- EVAL    PATIENT EDUCATION:  Education details: Pt educated on relevant anatomy, physiology, pathology, diagnosis, prognosis, progression of care, pain and activity modification related to RLE pain Person educated: Patient Education method: Explanation, Demonstration, and Handouts Education comprehension: verbalized understanding and returned demonstration  HOME EXERCISE PROGRAM: ***  ASSESSMENT:  CLINICAL IMPRESSION: Patient is a 49 y.o. F who was seen today for physical therapy evaluation and treatment for right leg pain. Symptoms are consistent with *** of *** and present with functional deficits consistent with required and desired activities and participations. Pt able to demonstrate ***. *** Pt stands to benefit from continued skilled physical therapy to address deficit areas and restore safety with activities and participations at home and in the community.     OBJECTIVE IMPAIRMENTS:  {opptimpairments:25111}.   ACTIVITY LIMITATIONS: {activitylimitations:27494}  PARTICIPATION LIMITATIONS: {participationrestrictions:25113}  PERSONAL FACTORS: {Personal factors:25162} are also affecting patient's functional outcome.   REHAB POTENTIAL: {rehabpotential:25112}  CLINICAL DECISION MAKING: {clinical decision making:25114}  EVALUATION COMPLEXITY: {Evaluation complexity:25115}   GOALS: Goals reviewed with patient? {yes/no:20286}  SHORT TERM GOALS: Target date: ***   Pt will report compliance with HEP to work towards ind and home management strategies Baseline: Goal status: {GOALSTATUS:25110}   2.  Pt will score no *** than *** on *** to demonstrate improved activity tolerance Baseline:  Goal status: {GOALSTATUS:25110}   3.  Pt will improve *** ROM to full and painless in order to demonstrate progress towards activity tolerance and improved function Baseline:  Goal status: {GOALSTATUS:25110}   4.  *** Baseline:  Goal status: {GOALSTATUS:25110}   5.  ***  Baseline:  Goal status: {GOALSTATUS:25110}   6.  *** Baseline:  Goal status: {GOALSTATUS:25110}   LONG TERM GOALS: Target date: ***   Pt will score no *** than *** on *** to demonstrate improved activity tolerance Baseline:  Goal status: {GOALSTATUS:25110}   2.  Pt will report no greater than ***/10 pain over 7 consecutive days to demonstrate maintained reduction in symptoms and improved tolerance to activity Baseline:  Goal status: {GOALSTATUS:25110}   3.  Pt will be ind in the management of their symptoms at home and in the community Baseline:  Goal status: {GOALSTATUS:25110}   4.  *** Baseline:  Goal status: {GOALSTATUS:25110}   5.  *** Baseline:  Goal status: {GOALSTATUS:25110}   6.  *** Baseline:  Goal status: {GOALSTATUS:25110}   PLAN:  PT FREQUENCY: {rehab frequency:25116}  PT DURATION: {rehab duration:25117}  PLANNED INTERVENTIONS: {rehab planned  interventions:25118::97110-Therapeutic exercises,97530- Therapeutic [phone]- Neuromuscular re-education,97535- Self Rjmz,02859- Manual therapy,Patient/Family education}  PLAN FOR NEXT SESSION: ***   Stann DELENA Ohara, PT 12/15/2024, 7:46 AM  "

## 2024-12-16 ENCOUNTER — Other Ambulatory Visit: Payer: Self-pay | Admitting: Family Medicine

## 2024-12-16 ENCOUNTER — Other Ambulatory Visit: Payer: Self-pay

## 2024-12-16 DIAGNOSIS — E119 Type 2 diabetes mellitus without complications: Secondary | ICD-10-CM

## 2024-12-16 MED ORDER — INSUPEN PEN NEEDLES 31G X 5 MM MISC
0 refills | Status: AC
  Filled 2024-12-16: qty 100, 50d supply, fill #0

## 2024-12-17 ENCOUNTER — Other Ambulatory Visit: Payer: Self-pay

## 2024-12-19 ENCOUNTER — Other Ambulatory Visit: Payer: Self-pay | Admitting: Family Medicine

## 2024-12-19 DIAGNOSIS — E119 Type 2 diabetes mellitus without complications: Secondary | ICD-10-CM

## 2024-12-21 ENCOUNTER — Other Ambulatory Visit: Payer: Self-pay

## 2024-12-24 ENCOUNTER — Other Ambulatory Visit: Payer: Self-pay

## 2024-12-28 ENCOUNTER — Other Ambulatory Visit: Payer: Self-pay

## 2024-12-28 ENCOUNTER — Encounter (HOSPITAL_COMMUNITY): Payer: Self-pay

## 2024-12-28 ENCOUNTER — Other Ambulatory Visit: Payer: Self-pay | Admitting: Nurse Practitioner

## 2024-12-28 ENCOUNTER — Other Ambulatory Visit (HOSPITAL_COMMUNITY): Payer: Self-pay

## 2024-12-28 DIAGNOSIS — E559 Vitamin D deficiency, unspecified: Secondary | ICD-10-CM

## 2024-12-28 DIAGNOSIS — I10 Essential (primary) hypertension: Secondary | ICD-10-CM

## 2024-12-28 MED ORDER — LISINOPRIL 20 MG PO TABS
20.0000 mg | ORAL_TABLET | Freq: Every day | ORAL | 0 refills | Status: AC
  Filled 2024-12-28: qty 30, 30d supply, fill #0

## 2024-12-31 ENCOUNTER — Encounter: Payer: Self-pay | Admitting: Nurse Practitioner

## 2025-01-04 ENCOUNTER — Ambulatory Visit: Admitting: Physical Therapy

## 2025-01-11 ENCOUNTER — Ambulatory Visit: Admitting: Physical Therapy

## 2025-02-09 ENCOUNTER — Ambulatory Visit: Admitting: Orthopaedic Surgery
